# Patient Record
Sex: Female | Born: 1935 | Race: Black or African American | Hispanic: No | Smoking: Former smoker
Health system: Southern US, Community
[De-identification: ages and names within clinical notes are randomized; demographics above are authoritative.]

## PROBLEM LIST (undated history)

## (undated) DIAGNOSIS — M1990 Unspecified osteoarthritis, unspecified site: Secondary | ICD-10-CM

## (undated) DIAGNOSIS — I10 Essential (primary) hypertension: Secondary | ICD-10-CM

## (undated) DIAGNOSIS — E785 Hyperlipidemia, unspecified: Secondary | ICD-10-CM

## (undated) DIAGNOSIS — K644 Residual hemorrhoidal skin tags: Secondary | ICD-10-CM

## (undated) DIAGNOSIS — N200 Calculus of kidney: Secondary | ICD-10-CM

## (undated) DIAGNOSIS — M8580 Other specified disorders of bone density and structure, unspecified site: Secondary | ICD-10-CM

## (undated) DIAGNOSIS — W3400XA Accidental discharge from unspecified firearms or gun, initial encounter: Secondary | ICD-10-CM

## (undated) DIAGNOSIS — J449 Chronic obstructive pulmonary disease, unspecified: Secondary | ICD-10-CM

## (undated) DIAGNOSIS — Z87891 Personal history of nicotine dependence: Secondary | ICD-10-CM

## (undated) DIAGNOSIS — B029 Zoster without complications: Secondary | ICD-10-CM

## (undated) DIAGNOSIS — G5600 Carpal tunnel syndrome, unspecified upper limb: Secondary | ICD-10-CM

## (undated) HISTORY — DX: Accidental discharge from unspecified firearms or gun, initial encounter: W34.00XA

## (undated) HISTORY — DX: Unspecified osteoarthritis, unspecified site: M19.90

## (undated) HISTORY — DX: Hyperlipidemia, unspecified: E78.5

## (undated) HISTORY — DX: Zoster without complications: B02.9

## (undated) HISTORY — DX: Calculus of kidney: N20.0

## (undated) HISTORY — DX: Chronic obstructive pulmonary disease, unspecified: J44.9

## (undated) HISTORY — DX: Personal history of nicotine dependence: Z87.891

## (undated) HISTORY — DX: Residual hemorrhoidal skin tags: K64.4

## (undated) HISTORY — DX: Essential (primary) hypertension: I10

## (undated) HISTORY — DX: Carpal tunnel syndrome, unspecified upper limb: G56.00

## (undated) HISTORY — DX: Other specified disorders of bone density and structure, unspecified site: M85.80

## (undated) HISTORY — PX: OTHER SURGICAL HISTORY: SHX169

## (undated) HISTORY — PX: ABDOMINAL HYSTERECTOMY: SHX81

---

## 1999-08-01 ENCOUNTER — Encounter: Admission: RE | Admit: 1999-08-01 | Discharge: 1999-08-01 | Payer: Self-pay | Admitting: Internal Medicine

## 1999-08-15 ENCOUNTER — Ambulatory Visit (HOSPITAL_COMMUNITY): Admission: RE | Admit: 1999-08-15 | Discharge: 1999-08-15 | Payer: Self-pay

## 1999-09-03 ENCOUNTER — Encounter: Admission: RE | Admit: 1999-09-03 | Discharge: 1999-09-03 | Payer: Self-pay | Admitting: Internal Medicine

## 1999-09-14 ENCOUNTER — Encounter: Admission: RE | Admit: 1999-09-14 | Discharge: 1999-09-14 | Payer: Self-pay | Admitting: Internal Medicine

## 1999-10-15 ENCOUNTER — Encounter: Admission: RE | Admit: 1999-10-15 | Discharge: 1999-10-15 | Payer: Self-pay | Admitting: Internal Medicine

## 2000-09-04 ENCOUNTER — Ambulatory Visit (HOSPITAL_COMMUNITY): Admission: RE | Admit: 2000-09-04 | Discharge: 2000-09-04 | Payer: Self-pay

## 2001-05-11 ENCOUNTER — Encounter: Admission: RE | Admit: 2001-05-11 | Discharge: 2001-05-11 | Payer: Self-pay | Admitting: Internal Medicine

## 2002-08-30 ENCOUNTER — Encounter: Admission: RE | Admit: 2002-08-30 | Discharge: 2002-08-30 | Payer: Self-pay | Admitting: Internal Medicine

## 2002-09-14 ENCOUNTER — Encounter: Admission: RE | Admit: 2002-09-14 | Discharge: 2002-09-14 | Payer: Self-pay | Admitting: Internal Medicine

## 2003-02-03 ENCOUNTER — Ambulatory Visit (HOSPITAL_COMMUNITY): Admission: RE | Admit: 2003-02-03 | Discharge: 2003-02-03 | Payer: Self-pay | Admitting: Internal Medicine

## 2003-02-09 ENCOUNTER — Encounter: Admission: RE | Admit: 2003-02-09 | Discharge: 2003-02-09 | Payer: Self-pay | Admitting: Internal Medicine

## 2003-02-09 ENCOUNTER — Ambulatory Visit (HOSPITAL_COMMUNITY): Admission: RE | Admit: 2003-02-09 | Discharge: 2003-02-09 | Payer: Self-pay | Admitting: Internal Medicine

## 2003-02-09 DIAGNOSIS — B029 Zoster without complications: Secondary | ICD-10-CM

## 2003-02-09 HISTORY — DX: Zoster without complications: B02.9

## 2003-03-09 ENCOUNTER — Encounter: Admission: RE | Admit: 2003-03-09 | Discharge: 2003-03-09 | Payer: Self-pay | Admitting: Internal Medicine

## 2003-04-20 ENCOUNTER — Encounter: Admission: RE | Admit: 2003-04-20 | Discharge: 2003-04-20 | Payer: Self-pay | Admitting: Internal Medicine

## 2003-05-03 ENCOUNTER — Encounter: Admission: RE | Admit: 2003-05-03 | Discharge: 2003-05-03 | Payer: Self-pay | Admitting: Internal Medicine

## 2003-11-02 ENCOUNTER — Encounter: Admission: RE | Admit: 2003-11-02 | Discharge: 2003-11-02 | Payer: Self-pay | Admitting: Internal Medicine

## 2003-11-28 ENCOUNTER — Encounter: Admission: RE | Admit: 2003-11-28 | Discharge: 2003-11-28 | Payer: Self-pay | Admitting: Internal Medicine

## 2004-02-29 ENCOUNTER — Ambulatory Visit: Payer: Self-pay | Admitting: Internal Medicine

## 2004-03-21 ENCOUNTER — Ambulatory Visit: Payer: Self-pay | Admitting: Internal Medicine

## 2005-01-08 ENCOUNTER — Encounter (INDEPENDENT_AMBULATORY_CARE_PROVIDER_SITE_OTHER): Payer: Self-pay | Admitting: *Deleted

## 2005-01-08 ENCOUNTER — Ambulatory Visit: Payer: Self-pay | Admitting: Internal Medicine

## 2005-01-08 LAB — CONVERTED CEMR LAB
Cholesterol: 175 mg/dL
HDL: 53 mg/dL
LDL Cholesterol: 101 mg/dL
Triglyceride fasting, serum: 104 mg/dL

## 2005-01-25 ENCOUNTER — Ambulatory Visit: Payer: Self-pay | Admitting: Internal Medicine

## 2005-03-29 ENCOUNTER — Ambulatory Visit (HOSPITAL_COMMUNITY): Admission: RE | Admit: 2005-03-29 | Discharge: 2005-03-29 | Payer: Self-pay | Admitting: *Deleted

## 2005-04-03 ENCOUNTER — Ambulatory Visit: Payer: Self-pay | Admitting: Internal Medicine

## 2005-04-17 ENCOUNTER — Encounter: Payer: Self-pay | Admitting: Emergency Medicine

## 2005-04-18 ENCOUNTER — Inpatient Hospital Stay (HOSPITAL_COMMUNITY): Admission: AD | Admit: 2005-04-18 | Discharge: 2005-04-18 | Payer: Self-pay | Admitting: Infectious Diseases

## 2005-05-17 ENCOUNTER — Ambulatory Visit: Payer: Self-pay | Admitting: Internal Medicine

## 2005-12-04 ENCOUNTER — Ambulatory Visit: Payer: Self-pay | Admitting: Internal Medicine

## 2005-12-04 ENCOUNTER — Inpatient Hospital Stay (HOSPITAL_COMMUNITY): Admission: EM | Admit: 2005-12-04 | Discharge: 2005-12-05 | Payer: Self-pay | Admitting: Emergency Medicine

## 2005-12-20 ENCOUNTER — Ambulatory Visit: Payer: Self-pay | Admitting: Internal Medicine

## 2006-01-13 ENCOUNTER — Ambulatory Visit: Payer: Self-pay | Admitting: Internal Medicine

## 2006-03-04 ENCOUNTER — Encounter (INDEPENDENT_AMBULATORY_CARE_PROVIDER_SITE_OTHER): Payer: Self-pay | Admitting: *Deleted

## 2006-03-04 DIAGNOSIS — G5600 Carpal tunnel syndrome, unspecified upper limb: Secondary | ICD-10-CM

## 2006-03-04 DIAGNOSIS — Z87898 Personal history of other specified conditions: Secondary | ICD-10-CM

## 2006-03-04 HISTORY — DX: Carpal tunnel syndrome, unspecified upper limb: G56.00

## 2006-06-30 DIAGNOSIS — J449 Chronic obstructive pulmonary disease, unspecified: Secondary | ICD-10-CM | POA: Insufficient documentation

## 2006-06-30 DIAGNOSIS — M1990 Unspecified osteoarthritis, unspecified site: Secondary | ICD-10-CM | POA: Insufficient documentation

## 2006-06-30 DIAGNOSIS — M899 Disorder of bone, unspecified: Secondary | ICD-10-CM | POA: Insufficient documentation

## 2006-06-30 DIAGNOSIS — I10 Essential (primary) hypertension: Secondary | ICD-10-CM

## 2006-06-30 DIAGNOSIS — Z87442 Personal history of urinary calculi: Secondary | ICD-10-CM

## 2006-06-30 DIAGNOSIS — E785 Hyperlipidemia, unspecified: Secondary | ICD-10-CM | POA: Insufficient documentation

## 2006-06-30 DIAGNOSIS — M949 Disorder of cartilage, unspecified: Secondary | ICD-10-CM

## 2007-01-06 ENCOUNTER — Encounter (INDEPENDENT_AMBULATORY_CARE_PROVIDER_SITE_OTHER): Payer: Self-pay | Admitting: *Deleted

## 2007-01-06 ENCOUNTER — Ambulatory Visit: Payer: Self-pay | Admitting: Hospitalist

## 2007-01-06 DIAGNOSIS — N760 Acute vaginitis: Secondary | ICD-10-CM | POA: Insufficient documentation

## 2007-01-08 LAB — CONVERTED CEMR LAB
Candida species: NEGATIVE
Gardnerella vaginalis: POSITIVE — AB
Trichomonal Vaginitis: NEGATIVE

## 2007-08-22 ENCOUNTER — Emergency Department (HOSPITAL_COMMUNITY): Admission: EM | Admit: 2007-08-22 | Discharge: 2007-08-22 | Payer: Self-pay | Admitting: Emergency Medicine

## 2008-02-10 ENCOUNTER — Telehealth: Payer: Self-pay | Admitting: Internal Medicine

## 2008-03-25 ENCOUNTER — Encounter (INDEPENDENT_AMBULATORY_CARE_PROVIDER_SITE_OTHER): Payer: Self-pay | Admitting: *Deleted

## 2008-03-25 ENCOUNTER — Ambulatory Visit: Payer: Self-pay | Admitting: Internal Medicine

## 2008-03-25 LAB — CONVERTED CEMR LAB
ALT: 9 units/L (ref 0–35)
AST: 10 units/L (ref 0–37)
Albumin: 4.1 g/dL (ref 3.5–5.2)
Alkaline Phosphatase: 58 units/L (ref 39–117)
BUN: 13 mg/dL (ref 6–23)
Basophils Absolute: 0 10*3/uL (ref 0.0–0.1)
Basophils Relative: 0 % (ref 0–1)
CO2: 27 meq/L (ref 19–32)
Calcium: 9.1 mg/dL (ref 8.4–10.5)
Chloride: 104 meq/L (ref 96–112)
Cholesterol: 184 mg/dL (ref 0–200)
Creatinine, Ser: 0.83 mg/dL (ref 0.40–1.20)
Eosinophils Absolute: 0.1 10*3/uL (ref 0.0–0.7)
Eosinophils Relative: 2 % (ref 0–5)
Glucose, Bld: 97 mg/dL (ref 70–99)
HCT: 49 % — ABNORMAL HIGH (ref 36.0–46.0)
HDL: 49 mg/dL (ref >39)
Hemoglobin: 15.8 g/dL — ABNORMAL HIGH (ref 12.0–15.0)
Hgb A1c MFr Bld: 5.9 %
LDL Cholesterol: 107 mg/dL — ABNORMAL HIGH (ref 0–99)
Lymphocytes Relative: 49 % — ABNORMAL HIGH (ref 12–46)
Lymphs Abs: 3.5 10*3/uL (ref 0.7–4.0)
MCHC: 32.2 g/dL (ref 30.0–36.0)
MCV: 98.4 fL (ref 78.0–100.0)
Monocytes Absolute: 0.5 10*3/uL (ref 0.1–1.0)
Monocytes Relative: 7 % (ref 3–12)
Neutro Abs: 2.9 10*3/uL (ref 1.7–7.7)
Neutrophils Relative %: 41 % — ABNORMAL LOW (ref 43–77)
Platelets: 306 10*3/uL (ref 150–400)
Potassium: 3.9 meq/L (ref 3.5–5.3)
RBC: 4.98 M/uL (ref 3.87–5.11)
RDW: 14.1 % (ref 11.5–15.5)
Sodium: 143 meq/L (ref 135–145)
TSH: 2.232 microintl units/mL (ref 0.350–4.50)
Total Bilirubin: 0.7 mg/dL (ref 0.3–1.2)
Total CHOL/HDL Ratio: 3.8
Total Protein: 6.9 g/dL (ref 6.0–8.3)
Triglycerides: 142 mg/dL (ref <150)
VLDL: 28 mg/dL (ref 0–40)
WBC: 7.2 10*3/uL (ref 4.0–10.5)

## 2008-04-11 ENCOUNTER — Ambulatory Visit: Payer: Self-pay | Admitting: Infectious Diseases

## 2008-04-11 ENCOUNTER — Telehealth (INDEPENDENT_AMBULATORY_CARE_PROVIDER_SITE_OTHER): Payer: Self-pay | Admitting: *Deleted

## 2008-04-11 LAB — CONVERTED CEMR LAB
OCCULT 1: NEGATIVE
OCCULT 2: NEGATIVE
OCCULT 3: NEGATIVE

## 2008-04-11 LAB — FECAL OCCULT BLOOD, GUAIAC: Fecal Occult Blood: NEGATIVE

## 2008-08-05 ENCOUNTER — Encounter (INDEPENDENT_AMBULATORY_CARE_PROVIDER_SITE_OTHER): Payer: Self-pay | Admitting: *Deleted

## 2008-08-05 ENCOUNTER — Ambulatory Visit: Payer: Self-pay | Admitting: Internal Medicine

## 2008-08-12 ENCOUNTER — Telehealth (INDEPENDENT_AMBULATORY_CARE_PROVIDER_SITE_OTHER): Payer: Self-pay | Admitting: Pharmacy Technician

## 2008-10-17 ENCOUNTER — Telehealth (INDEPENDENT_AMBULATORY_CARE_PROVIDER_SITE_OTHER): Payer: Self-pay | Admitting: *Deleted

## 2009-03-08 ENCOUNTER — Ambulatory Visit: Payer: Self-pay | Admitting: Internal Medicine

## 2009-03-09 LAB — CONVERTED CEMR LAB
ALT: 8 units/L (ref 0–35)
AST: 11 units/L (ref 0–37)
Albumin: 4.2 g/dL (ref 3.5–5.2)
Alkaline Phosphatase: 81 units/L (ref 39–117)
BUN: 10 mg/dL (ref 6–23)
Basophils Absolute: 0 10*3/uL (ref 0.0–0.1)
Basophils Relative: 1 % (ref 0–1)
CO2: 28 meq/L (ref 19–32)
Calcium: 9.2 mg/dL (ref 8.4–10.5)
Chloride: 104 meq/L (ref 96–112)
Cholesterol: 189 mg/dL (ref 0–200)
Creatinine, Ser: 0.79 mg/dL (ref 0.40–1.20)
Creatinine, Urine: 222.4 mg/dL
Eosinophils Absolute: 0.1 10*3/uL (ref 0.0–0.7)
Eosinophils Relative: 2 % (ref 0–5)
Glucose, Bld: 112 mg/dL — ABNORMAL HIGH (ref 70–99)
HCT: 49.3 % — ABNORMAL HIGH (ref 36.0–46.0)
HDL: 54 mg/dL (ref >39)
Hemoglobin: 15.6 g/dL — ABNORMAL HIGH (ref 12.0–15.0)
LDL Cholesterol: 108 mg/dL — ABNORMAL HIGH (ref 0–99)
Lymphocytes Relative: 54 % — ABNORMAL HIGH (ref 12–46)
Lymphs Abs: 3.4 10*3/uL (ref 0.7–4.0)
MCHC: 31.6 g/dL (ref 30.0–36.0)
MCV: 99 fL (ref >=78.0)
Microalb Creat Ratio: 9.7 mg/g (ref 0.0–30.0)
Microalb, Ur: 2.15 mg/dL — ABNORMAL HIGH (ref 0.00–1.89)
Monocytes Absolute: 0.4 10*3/uL (ref 0.1–1.0)
Monocytes Relative: 7 % (ref 3–12)
Neutro Abs: 2.3 10*3/uL (ref 1.7–7.7)
Neutrophils Relative %: 36 % — ABNORMAL LOW (ref 43–77)
Platelets: 269 10*3/uL (ref 150–400)
Potassium: 3.8 meq/L (ref 3.5–5.3)
RBC: 4.98 M/uL (ref 3.87–5.11)
RDW: 13 % (ref 11.5–15.5)
Sodium: 142 meq/L (ref 135–145)
Total Bilirubin: 0.6 mg/dL (ref 0.3–1.2)
Total CHOL/HDL Ratio: 3.5
Total Protein: 7 g/dL (ref 6.0–8.3)
Triglycerides: 136 mg/dL (ref <150)
VLDL: 27 mg/dL (ref 0–40)
WBC: 6.3 10*3/uL (ref 4.0–10.5)

## 2009-03-27 ENCOUNTER — Ambulatory Visit: Payer: Self-pay | Admitting: Internal Medicine

## 2009-03-27 LAB — CONVERTED CEMR LAB
OCCULT 1: NEGATIVE
OCCULT 2: NEGATIVE
OCCULT 3: NEGATIVE

## 2009-06-03 ENCOUNTER — Emergency Department (HOSPITAL_COMMUNITY): Admission: EM | Admit: 2009-06-03 | Discharge: 2009-06-03 | Payer: Self-pay | Admitting: Emergency Medicine

## 2010-02-27 ENCOUNTER — Ambulatory Visit: Payer: Self-pay | Admitting: Internal Medicine

## 2010-02-27 LAB — CONVERTED CEMR LAB
BUN: 13 mg/dL (ref 6–23)
CO2: 29 meq/L (ref 19–32)
Calcium: 9.3 mg/dL (ref 8.4–10.5)
Chloride: 104 meq/L (ref 96–112)
Cholesterol: 173 mg/dL (ref 0–200)
Creatinine, Ser: 0.75 mg/dL (ref 0.40–1.20)
Glucose, Bld: 103 mg/dL — ABNORMAL HIGH (ref 70–99)
HDL: 54 mg/dL (ref >39)
LDL Cholesterol: 97 mg/dL (ref 0–99)
Potassium: 4.3 meq/L (ref 3.5–5.3)
Sodium: 143 meq/L (ref 135–145)
Total CHOL/HDL Ratio: 3.2
Triglycerides: 112 mg/dL (ref <150)
VLDL: 22 mg/dL (ref 0–40)

## 2010-02-27 LAB — HM MAMMOGRAPHY
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
## 2010-07-13 ENCOUNTER — Ambulatory Visit: Admit: 2010-07-13 | Payer: Self-pay

## 2010-07-13 ENCOUNTER — Encounter: Payer: Self-pay | Admitting: Internal Medicine

## 2010-07-13 ENCOUNTER — Ambulatory Visit (INDEPENDENT_AMBULATORY_CARE_PROVIDER_SITE_OTHER): Payer: MEDICARE | Admitting: Internal Medicine

## 2010-07-13 VITALS — BP 130/70 | HR 83 | Temp 97.1°F | Ht 65.0 in | Wt 187.4 lb

## 2010-07-13 DIAGNOSIS — J4489 Other specified chronic obstructive pulmonary disease: Secondary | ICD-10-CM

## 2010-07-13 DIAGNOSIS — J449 Chronic obstructive pulmonary disease, unspecified: Secondary | ICD-10-CM

## 2010-07-13 MED ORDER — ALBUTEROL SULFATE HFA 108 (90 BASE) MCG/ACT IN AERS: 2.0000 | INHALATION_SPRAY | RESPIRATORY_TRACT | Status: DC | PRN

## 2010-07-13 MED ORDER — ALBUTEROL SULFATE (2.5 MG/3ML) 0.083% IN NEBU
2.5000 mg | INHALATION_SOLUTION | Freq: Once | RESPIRATORY_TRACT | Status: AC
  Administered 2010-07-13: 5 mg via RESPIRATORY_TRACT

## 2010-07-13 MED ORDER — FLUTICASONE PROPIONATE HFA 110 MCG/ACT IN AERO: 1.0000 | INHALATION_SPRAY | Freq: Two times a day (BID) | RESPIRATORY_TRACT | Status: DC

## 2010-07-13 MED ORDER — ALBUTEROL SULFATE 0.63 MG/3ML IN NEBU: 0.6300 mg | INHALATION_SOLUTION | Freq: Once | RESPIRATORY_TRACT | Status: DC

## 2010-07-13 MED ORDER — AMLODIPINE BESYLATE 5 MG PO TABS: 5.0000 mg | ORAL_TABLET | Freq: Every day | ORAL | Status: DC

## 2010-07-13 MED ORDER — PRAVASTATIN SODIUM 40 MG PO TABS: 80.0000 mg | ORAL_TABLET | Freq: Every day | ORAL | Status: DC

## 2010-07-13 NOTE — Progress Notes (Signed)
Addended byChinita Pester on: 07/13/2010 12:55 PM   Modules accepted: Orders

## 2010-07-13 NOTE — Assessment & Plan Note (Signed)
Is nearing need for continuous O2, but is also currently out of her inhalers. Nothing to suggest her current symptoms are acute, and she feels at baseline. As a precaution will check a CBC and BNP,  Plan to refill her MDI's (she has Medicare part D) have her RTC in one week to recheck sats, if still low begin O2 then

## 2010-07-13 NOTE — Progress Notes (Signed)
  Subjective:    Patient ID: [name], female    DOB: Oct 08, 1931, 77 y.o.   MRN: [id number]  HPI    Review of Systems  Constitutional: Positive for fatigue.  HENT: Negative for congestion, rhinorrhea, sneezing and postnasal drip.   Eyes: Negative for discharge, itching and visual disturbance.  Respiratory: Positive for cough, chest tightness, shortness of breath and wheezing. Negative for choking.   Cardiovascular: Negative for chest pain, palpitations and leg swelling.  Gastrointestinal: Negative for abdominal distention.  Genitourinary: Positive for frequency (Has nocturia x 3-4). Negative for hematuria.  Neurological: Negative for dizziness, light-headedness and headaches.  Psychiatric/Behavioral: Negative for dysphoric mood and agitation.       Objective:   Physical Exam  Constitutional: She is oriented to person, place, and time.       Had on initial presentation cyanotic appearance with SaO2 of 85%, improving quickly to 97% after nebulized albuterol  HENT:  Head: Normocephalic and atraumatic.  Eyes: Conjunctivae and EOM are normal. Pupils are equal, round, and reactive to light.  Neck: No thyromegaly present.  Cardiovascular: Normal rate, regular rhythm, normal heart sounds and intact distal pulses.  Exam reveals no gallop and no friction rub.   No murmur heard. Pulmonary/Chest: She is in respiratory distress (modearate increased resp effort). She has wheezes. She has rales (a few basilar crackles).  Musculoskeletal: Normal range of motion. She exhibits no edema and no tenderness.  Neurological: She is alert and oriented to person, place, and time. She has normal reflexes.  Skin: Skin is warm. She is not diaphoretic.  Psychiatric: Her behavior is normal. Judgment normal.          Assessment & Plan:

## 2010-08-20 ENCOUNTER — Encounter: Payer: Self-pay | Admitting: Ophthalmology

## 2010-09-10 LAB — CBC
HCT: 46.2 % — ABNORMAL HIGH (ref 36.0–46.0)
Hemoglobin: 14.9 g/dL (ref 12.0–15.0)
MCHC: 32.2 g/dL (ref 30.0–36.0)
MCV: 98.1 fL (ref 78.0–100.0)
Platelets: 221 10*3/uL (ref 150–400)
RBC: 4.71 MIL/uL (ref 3.87–5.11)
RDW: 13.6 % (ref 11.5–15.5)
WBC: 8.7 10*3/uL (ref 4.0–10.5)

## 2010-09-10 LAB — DIFFERENTIAL
Basophils Absolute: 0.1 10*3/uL (ref 0.0–0.1)
Basophils Relative: 1 % (ref 0–1)
Eosinophils Absolute: 0.2 10*3/uL (ref 0.0–0.7)
Eosinophils Relative: 2 % (ref 0–5)
Lymphocytes Relative: 52 % — ABNORMAL HIGH (ref 12–46)
Lymphs Abs: 4.6 10*3/uL — ABNORMAL HIGH (ref 0.7–4.0)
Monocytes Absolute: 0.6 10*3/uL (ref 0.1–1.0)
Monocytes Relative: 7 % (ref 3–12)
Neutro Abs: 3.4 10*3/uL (ref 1.7–7.7)
Neutrophils Relative %: 39 % — ABNORMAL LOW (ref 43–77)

## 2010-09-10 LAB — POCT I-STAT, CHEM 8
BUN: 13 mg/dL (ref 6–23)
Calcium, Ion: 1.1 mmol/L — ABNORMAL LOW (ref 1.12–1.32)
Chloride: 106 mEq/L (ref 96–112)
Creatinine, Ser: 0.7 mg/dL (ref 0.4–1.2)
Glucose, Bld: 150 mg/dL — ABNORMAL HIGH (ref 70–99)
HCT: 49 % — ABNORMAL HIGH (ref 36.0–46.0)
Hemoglobin: 16.7 g/dL — ABNORMAL HIGH (ref 12.0–15.0)
Potassium: 3.4 mEq/L — ABNORMAL LOW (ref 3.5–5.1)
Sodium: 144 mEq/L (ref 135–145)
TCO2: 32 mmol/L (ref 0–100)
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
## 2010-12-16 ENCOUNTER — Encounter: Payer: Self-pay | Admitting: Internal Medicine

## 2011-03-01 ENCOUNTER — Emergency Department (HOSPITAL_COMMUNITY): Payer: Medicare Other

## 2011-03-01 ENCOUNTER — Emergency Department (HOSPITAL_COMMUNITY)
Admission: EM | Admit: 2011-03-01 | Discharge: 2011-03-01 | Disposition: A | Discharge status: Other Acute Inpatient Hospital | Payer: Medicare Other | Source: Home / Self Care | Attending: Emergency Medicine | Admitting: Emergency Medicine

## 2011-03-01 DIAGNOSIS — R42 Dizziness and giddiness: Secondary | ICD-10-CM

## 2011-03-01 DIAGNOSIS — R0602 Shortness of breath: Secondary | ICD-10-CM

## 2011-03-01 LAB — POCT I-STAT, CHEM 8
BUN: 15 mg/dL (ref 6–23)
Calcium, Ion: 1.14 mmol/L (ref 1.12–1.32)
Chloride: 103 mEq/L (ref 96–112)
Creatinine, Ser: 0.9 mg/dL (ref 0.50–1.10)
Glucose, Bld: 104 mg/dL — ABNORMAL HIGH (ref 70–99)
HCT: 59 % — ABNORMAL HIGH (ref 36.0–46.0)
Hemoglobin: 20.1 g/dL — ABNORMAL HIGH (ref 12.0–15.0)
Potassium: 3.7 mEq/L (ref 3.5–5.1)
Sodium: 142 mEq/L (ref 135–145)
TCO2: 28 mmol/L (ref 0–100)

## 2011-03-01 LAB — BLOOD GAS, ARTERIAL
Bicarbonate: 31.2 mEq/L — ABNORMAL HIGH (ref 20.0–24.0)
O2 Saturation: 96.7 %
Patient temperature: 37
TCO2: 27.3 mmol/L (ref 0–100)
pH, Arterial: 7.21 — ABNORMAL LOW (ref 7.350–7.400)

## 2011-03-01 LAB — CBC
HCT: 53.1 % — ABNORMAL HIGH (ref 36.0–46.0)
Hemoglobin: 17.4 g/dL — ABNORMAL HIGH (ref 12.0–15.0)
MCH: 31.9 pg (ref 26.0–34.0)
MCHC: 32.8 g/dL (ref 30.0–36.0)
MCV: 97.4 fL (ref 78.0–100.0)
Platelets: 230 10*3/uL (ref 150–400)
RBC: 5.45 MIL/uL — ABNORMAL HIGH (ref 3.87–5.11)
RDW: 13.7 % (ref 11.5–15.5)
WBC: 6.5 10*3/uL (ref 4.0–10.5)

## 2011-03-01 LAB — URINALYSIS, ROUTINE W REFLEX MICROSCOPIC
Glucose, UA: NEGATIVE mg/dL
Leukocytes, UA: NEGATIVE
Nitrite: NEGATIVE
Protein, ur: 30 mg/dL — AB
pH: 6 (ref 5.0–8.0)

## 2011-03-01 LAB — POCT I-STAT TROPONIN I: Troponin i, poc: 0.02 ng/mL (ref 0.00–0.08)

## 2011-03-01 LAB — URINE MICROSCOPIC-ADD ON

## 2011-03-01 LAB — GLUCOSE, CAPILLARY: Glucose-Capillary: 118 mg/dL — ABNORMAL HIGH (ref 70–99)

## 2011-03-01 LAB — PRO B NATRIURETIC PEPTIDE: Pro B Natriuretic peptide (BNP): 613.2 pg/mL — ABNORMAL HIGH (ref 0–450)

## 2011-03-02 ENCOUNTER — Inpatient Hospital Stay (HOSPITAL_COMMUNITY): Payer: Medicare Other

## 2011-03-02 ENCOUNTER — Inpatient Hospital Stay (HOSPITAL_COMMUNITY)
Admission: EM | Admit: 2011-03-02 | Discharge: 2011-03-06 | DRG: 291 | Disposition: A | Discharge status: Home Health | Payer: Medicare Other | Source: Other Acute Inpatient Hospital | Attending: Internal Medicine | Admitting: Internal Medicine

## 2011-03-02 DIAGNOSIS — I4891 Unspecified atrial fibrillation: Secondary | ICD-10-CM

## 2011-03-02 DIAGNOSIS — Z23 Encounter for immunization: Secondary | ICD-10-CM

## 2011-03-02 DIAGNOSIS — M1990 Unspecified osteoarthritis, unspecified site: Secondary | ICD-10-CM | POA: Diagnosis present

## 2011-03-02 DIAGNOSIS — I5043 Acute on chronic combined systolic (congestive) and diastolic (congestive) heart failure: Principal | ICD-10-CM | POA: Diagnosis present

## 2011-03-02 DIAGNOSIS — Z79899 Other long term (current) drug therapy: Secondary | ICD-10-CM

## 2011-03-02 DIAGNOSIS — I10 Essential (primary) hypertension: Secondary | ICD-10-CM | POA: Diagnosis present

## 2011-03-02 DIAGNOSIS — I509 Heart failure, unspecified: Secondary | ICD-10-CM | POA: Diagnosis present

## 2011-03-02 DIAGNOSIS — I471 Supraventricular tachycardia: Secondary | ICD-10-CM

## 2011-03-02 DIAGNOSIS — I279 Pulmonary heart disease, unspecified: Secondary | ICD-10-CM | POA: Diagnosis present

## 2011-03-02 DIAGNOSIS — F17200 Nicotine dependence, unspecified, uncomplicated: Secondary | ICD-10-CM | POA: Diagnosis present

## 2011-03-02 DIAGNOSIS — J441 Chronic obstructive pulmonary disease with (acute) exacerbation: Secondary | ICD-10-CM | POA: Diagnosis present

## 2011-03-02 DIAGNOSIS — I498 Other specified cardiac arrhythmias: Secondary | ICD-10-CM | POA: Diagnosis present

## 2011-03-02 DIAGNOSIS — J9600 Acute respiratory failure, unspecified whether with hypoxia or hypercapnia: Secondary | ICD-10-CM

## 2011-03-02 DIAGNOSIS — E785 Hyperlipidemia, unspecified: Secondary | ICD-10-CM | POA: Diagnosis present

## 2011-03-02 DIAGNOSIS — J449 Chronic obstructive pulmonary disease, unspecified: Secondary | ICD-10-CM

## 2011-03-02 DIAGNOSIS — J9620 Acute and chronic respiratory failure, unspecified whether with hypoxia or hypercapnia: Secondary | ICD-10-CM | POA: Diagnosis present

## 2011-03-02 LAB — BLOOD GAS, ARTERIAL
Acid-Base Excess: 5.6 mmol/L — ABNORMAL HIGH (ref 0.0–2.0)
Delivery systems: POSITIVE
Drawn by: 34779
Expiratory PAP: 5
FIO2: 0.4 %
Inspiratory PAP: 10
O2 Saturation: 91.3 %
Patient temperature: 98.6
RATE: 12 resp/min
pO2, Arterial: 63.6 mmHg — ABNORMAL LOW (ref 80.0–100.0)

## 2011-03-02 LAB — BASIC METABOLIC PANEL
BUN: 14 mg/dL (ref 6–23)
CO2: 33 mEq/L — ABNORMAL HIGH (ref 19–32)
Chloride: 104 mEq/L (ref 96–112)
GFR calc non Af Amer: >60 mL/min (ref >60)
Glucose, Bld: 144 mg/dL — ABNORMAL HIGH (ref 70–99)
Potassium: 4.1 mEq/L (ref 3.5–5.1)
Sodium: 144 mEq/L (ref 135–145)

## 2011-03-02 LAB — LIPID PANEL
HDL: 65 mg/dL (ref >39)
LDL Cholesterol: 99 mg/dL (ref 0–99)
Total CHOL/HDL Ratio: 2.7 RATIO
Triglycerides: 47 mg/dL (ref <150)

## 2011-03-02 LAB — MRSA PCR SCREENING: MRSA by PCR: NEGATIVE

## 2011-03-02 LAB — CK TOTAL AND CKMB (NOT AT ARMC): CK, MB: 5.3 ng/mL — ABNORMAL HIGH (ref 0.3–4.0)

## 2011-03-03 ENCOUNTER — Inpatient Hospital Stay (HOSPITAL_COMMUNITY): Payer: Medicare Other

## 2011-03-03 DIAGNOSIS — J449 Chronic obstructive pulmonary disease, unspecified: Secondary | ICD-10-CM

## 2011-03-03 DIAGNOSIS — I471 Supraventricular tachycardia: Secondary | ICD-10-CM

## 2011-03-03 DIAGNOSIS — J9600 Acute respiratory failure, unspecified whether with hypoxia or hypercapnia: Secondary | ICD-10-CM

## 2011-03-03 LAB — BASIC METABOLIC PANEL
BUN: 26 mg/dL — ABNORMAL HIGH (ref 6–23)
CO2: 37 mEq/L — ABNORMAL HIGH (ref 19–32)
Calcium: 8.8 mg/dL (ref 8.4–10.5)
Chloride: 102 mEq/L (ref 96–112)
Creatinine, Ser: 0.85 mg/dL (ref 0.50–1.10)

## 2011-03-03 LAB — CBC
HCT: 49.4 % — ABNORMAL HIGH (ref 36.0–46.0)
MCH: 32.6 pg (ref 26.0–34.0)
MCV: 97.6 fL (ref 78.0–100.0)
RBC: 5.06 MIL/uL (ref 3.87–5.11)
WBC: 7.6 10*3/uL (ref 4.0–10.5)

## 2011-03-03 LAB — BLOOD GAS, ARTERIAL
Acid-Base Excess: 7.4 mmol/L — ABNORMAL HIGH (ref 0.0–2.0)
O2 Content: 4 L/min
O2 Saturation: 92.6 %
TCO2: 34.8 mmol/L (ref 0–100)
pCO2 arterial: 61.2 mmHg (ref 35.0–45.0)
pO2, Arterial: 67 mmHg — ABNORMAL LOW (ref 80.0–100.0)

## 2011-03-04 ENCOUNTER — Inpatient Hospital Stay (HOSPITAL_COMMUNITY): Payer: Medicare Other

## 2011-03-04 DIAGNOSIS — I471 Supraventricular tachycardia: Secondary | ICD-10-CM

## 2011-03-04 LAB — BASIC METABOLIC PANEL
BUN: 42 mg/dL — ABNORMAL HIGH (ref 6–23)
BUN: 4 — ABNORMAL LOW
CO2: 33 mEq/L — ABNORMAL HIGH (ref 19–32)
Calcium: 8.4 mg/dL (ref 8.4–10.5)
Calcium: 9.2
Chloride: 101 mEq/L (ref 96–112)
Creatinine, Ser: 0.89 mg/dL (ref 0.50–1.10)
Creatinine, Ser: 0.89
GFR calc Af Amer: >60 mL/min (ref >60)
GFR calc non Af Amer: >60 mL/min (ref >60)
GFR calc non Af Amer: >60
Glucose, Bld: 118 mg/dL — ABNORMAL HIGH (ref 70–99)
Glucose, Bld: 103 — ABNORMAL HIGH
Potassium: 3.5 mEq/L (ref 3.5–5.1)
Potassium: 3.6
Sodium: 142 mEq/L (ref 135–145)

## 2011-03-04 LAB — DIFFERENTIAL
Eosinophils Absolute: 0.2
Eosinophils Relative: 2
Lymphs Abs: 3.5
Monocytes Absolute: 0.4
Monocytes Relative: 5

## 2011-03-04 LAB — CBC
HCT: 50.2 % — ABNORMAL HIGH (ref 36.0–46.0)
HCT: 46.3 — ABNORMAL HIGH
Hemoglobin: 16.1 g/dL — ABNORMAL HIGH (ref 12.0–15.0)
MCH: 31.6 pg (ref 26.0–34.0)
MCHC: 32.1 g/dL (ref 30.0–36.0)
MCV: 98.6 fL (ref 78.0–100.0)
Platelets: 238 10*3/uL (ref 150–400)
Platelets: 325
RBC: 5.09 MIL/uL (ref 3.87–5.11)
RDW: 13.6 % (ref 11.5–15.5)
RDW: 13.7
WBC: 10.9 10*3/uL — ABNORMAL HIGH (ref 4.0–10.5)

## 2011-03-04 LAB — POCT CARDIAC MARKERS: Myoglobin, poc: 43.4

## 2011-03-05 DIAGNOSIS — J9600 Acute respiratory failure, unspecified whether with hypoxia or hypercapnia: Secondary | ICD-10-CM

## 2011-03-05 DIAGNOSIS — I471 Supraventricular tachycardia: Secondary | ICD-10-CM

## 2011-03-05 DIAGNOSIS — J449 Chronic obstructive pulmonary disease, unspecified: Secondary | ICD-10-CM

## 2011-03-05 LAB — BASIC METABOLIC PANEL
CO2: 36 mEq/L — ABNORMAL HIGH (ref 19–32)
Chloride: 100 mEq/L (ref 96–112)
GFR calc non Af Amer: >60 mL/min (ref >60)
Glucose, Bld: 80 mg/dL (ref 70–99)
Potassium: 4 mEq/L (ref 3.5–5.1)
Sodium: 143 mEq/L (ref 135–145)

## 2011-03-06 DIAGNOSIS — J449 Chronic obstructive pulmonary disease, unspecified: Secondary | ICD-10-CM

## 2011-03-19 NOTE — Discharge Summary (Signed)
[name], [name] NO.:  [id number]  MEDICAL RECORD NO.:  [id number]  LOCATION:  2033                         FACILITY:  [facility]  PHYSICIAN:  Shannon Pick. Eden Emms, MD, FACCDATE OF BIRTH:  07-31-1931  DATE OF ADMISSION:  03/02/2011 DATE OF DISCHARGE:  03/06/2011                              DISCHARGE SUMMARY   PRIMARY CARDIOLOGIST:  Shannon Riffle, MD, [facility]  PULMONOLOGIST:  Shannon Cradle. Delford Field, MD, [facility]  PRIMARY CARE:  Provided at the [facility].  DISCHARGE DIAGNOSES: 1. Acute on chronic severe obstructive lung disease. 2. Acute hypercarbic respiratory failure. 3. Supraventricular tachycardia in the setting of respiratory failure. 4. Ongoing tobacco abuse, now on home O2. 5. Hyperlipidemia. 6. Hypertension. 7. Osteoarthritis. 8. Acute diastolic congestive heart failure with normal left     ventricular function by echocardiogram.  SECONDARY DIAGNOSIS:  Pulmonary hypertension.  ALLERGIES:  No known drug allergies.  HISTORY OF PRESENT ILLNESS:  This is a 77 year old female with history of severe COPD who continues to smoke at home, who was in her usual state of health until the day prior to admission when she began to experience progressive dyspnea with lightheadedness, dizziness, and neck discomfort.  She presented to [facility] ED on March 01, 2011, and her chest x-ray showed COPD with superimposed new pulmonary vascular prominence suggestive of mild edema.  She was tachycardic and having intermittent runs of SVT which were managed with diltiazem infusion. Her BNP was mildly elevated at 613 and Cardiology was called for evaluation and admission.  HOSPITAL COURSE:  Following admission, the patient was placed on nebulizer therapy as well as prednisone for her severe lung disease. She was also placed on intravenous Lasix and this was subsequently discontinued.  Pulmonary edema resolved on chest x-ray by March 03, 2011.  Two-D  echocardiogram was performed on March 03, 2011, showing normal LV function without wall motion abnormalities or valvular abnormalities.  She was noted to have an elevated pulmonary artery systolic pressure of 71 mmHg.  As the patient's respiratory issues were felt to be more pulmonary in nature, Pulmonology was consulted on March 02, 2011, and they recommended initiation of Xopenex nebulizer p.r.n. in addition to budesonide and Brovana nebulizers.  The patient had already been placed on prednisone taper on the day of admission with multiple modalities and respiratory status improved.  Case Management has been working with the patient and has arranged for advanced home care for home O2 (2 liters at rest and 3 liters with exertion) as well as outpatient nebulizer therapy.  She is felt to be stable for discharge today in good condition.  For the patient's SVT, this was initially controlled with IV diltiazem which was subsequently converted to oral short-acting diltiazem.  As the patient's respiratory status improved, the patient has had no further SVT.  DISCHARGE LABS:  Hemoglobin 16.1, hematocrit 50.2, WBC 10.9, and platelets 238.  D-dimer 0.45.  Sodium 143, potassium 4.0, chloride 100, CO2 of 36, BUN 26, creatinine 0.27, glucose 80, and calcium 9.0.  CK 72, MB 5.3, and troponin I less than 0.30.  BNP 227.  Total cholesterol 173, triglycerides 47, HDL 65, and LDL 99.  MRSA  screen was negative.  DISPOSITION:  The patient will be discharged home today in good condition.  FOLLOWUP PLANS AND APPOINTMENTS:  The patient has to follow up with [facility]/Dr. Delford Jacobs on March 22, 2011, at 11:30 a.m.  She will follow with Dr. Tenny Jacobs on April 01, 2011, at 8:30 a.m.  DISCHARGE MEDICATIONS: 1. Aspirin 81 mg daily. 2. Brovana 15 mcg/2 mL 15 mcg b.i.d. 3. Budesonide 0.25 mg/2 mL 0.25 mg inhaled b.i.d. 4. Diltiazem 30 mg b.i.d. 5. Xopenex 1.25 mg inhaled q.4 h. p.r.n. 6.  Protonix 40 mg daily. 7. Prednisone 10 mg 3 tablets daily x3 days, then 2 tablets daily x3     days, then 1 tablet daily x3 days. 8. Albuterol inhaler 2 puffs q.4 h. p.r.n. 9. Pravachol 40 mg 2 tablets daily.  OUTSTANDING LABS AND STUDIES:  None.  Duration of discharge encounter is 45 minutes including physician time.     [name], ANP   ______________________________ Shannon Pick. Eden Emms, MD, [facility]    CB/MEDQ  D:  03/06/2011  T:  03/06/2011  Job:  [id number]  Electronically Signed by [name] ANP on 03/12/2011 06:09:21 PM Electronically Signed by Charlton Haws MD [facility] on 03/19/2011 07:25:36 PM

## 2011-03-21 NOTE — H&P (Signed)
NAME:  CORRISSA, MARTELLO NO.:  [id number]  MEDICAL RECORD NO.:  [id number]  LOCATION:  [facility]                         FACILITY:  [facility]  PHYSICIAN:  Pricilla Riffle, MD, FACCDATE OF BIRTH:  [birth year]  DATE OF ADMISSION:  03/01/2011 DATE OF DISCHARGE:  03/01/2011                             HISTORY & PHYSICAL   IDENTIFICATION:  Ms. Bina is a 77 year old who presents to the emergency room with shortness of breath and dizziness.  HISTORY OF PRESENT ILLNESS:  The patient said she started feeling badly yesterday.  She was lightheaded, weak, short of breath, and dizzy.  No syncope.  Episodes would come and go.  She presented to the emergency room for evaluation and was found to be an SVT at times, break started again.  IV Cardizem was started.  The patient denied palpitations.  Note, she has been out of breath for a long time with exertion.  She sleeps okay except has to go to the bathroom 4-5 times per night. Denies chest pain.  ALLERGIES:  None.  MEDICATIONS AT HOME:  She is on pravastatin question dose, antihypertensive question name, albuterol and Ventolin inhalers as needed.Marland Kitchen  PAST MEDICAL HISTORY: 1. COPD. 2. Dyslipidemia. 3. Hypertension. 4. Osteoarthritis.  SOCIAL HISTORY:  The patient lives in [place] with her daughter.  She has greater than a 50 pack-year history of smoking.  She is now down to a rare cigarette.  Drinks occasionally.  FAMILY HISTORY:  Mother died of a CVA.  Father died of unknown causes. One brother on oxygen with COPD and one sister on dialysis.  REVIEW OF SYSTEMS:  No fevers, chills.  No headaches.  Shortness of breath and dyspnea on exertion as noted.  The patient can barely get across the room, she says.  One pillow orthopnea is chronic.  No edema. No chest pain.  No urinary urgency or dysuria.  No nausea, vomiting, or diarrhea.  Otherwise, all systems reviewed and negative to the above problem except as noted  above.  PHYSICAL EXAMINATION:  GENERAL:  The patient is in mild distress because of shortness of breath.  She is breathing through pursed lips. VITAL SIGNS:  Blood pressure 124/84, pulse 72 and regular, temperature is 98; O2 sat on room air was 83, then 88 on non-rebreather, now 90% on 3 L, decreases with movement to 88%. HEENT:  Normocephalic, atraumatic, PERRLA.  EOMI. NECK:  No JVD.  No bruits. LUNGS:  Decreased airflow throughout.  No rales, no wheezes. CARDIAC:  Regular rate and rhythm.  S1 and S2.  No S3, S4, or murmurs. ABDOMEN:  Supple, nontender.  No hepatomegaly.  No masses.  Normal bowel sounds. EXTREMITIES:  Good distal pulses.  No lower extremity edema. NEUROLOGIC:  Alert and oriented x3.  Cranial nerves II through XII are grossly intact, moving all extremities.  Chest x-ray, rotated tortuosity of the aorta noted, mild vascular prominence, COPD.  A 12-lead EKG; at 1814 sinus rhythm with PACs and PVCs, 104 beats per minute.  At 1831, SVT at 230 beats per minute.  LABORATORY DATA:  Significant for a hemoglobin of 17.4, BUN and creatinine of 15 and 0.9, potassium of 3.7, sodium of  142.  BNP of 613. Initial troponin 0.02.  IMPRESSION: 1. The patient is a 77 year old woman with a history of significant     chronic obstructive pulmonary disease.  She presents with shortness     of breath and dizziness.  In the emergency room, she was found to     have a few episodes of supraventricular tachycardia that started     abruptly and converted on their own.  With this, she felt more     short of breath and more dizzy, but she did not sense palpitations.     On examination, significant for decreased airflow, the patient is     breathing with pursed lips, sats are marginal.  Chest x-ray     shifted, some vascular congestion with a BNP of 613.  Recommend:     a.     Continue IV diltiazem.     b.     Get echo in the morning.     c.     Steroid/NMTs, Pulmonary consult in morning.      d.     Check ABG, check D-dimer. 2. Hypertension.  Follow on diltiazem, get medicines from home. 3. Dyslipidemia.  Get medicines from home on dosing.  Check lipid     values in a.m.     Pricilla Riffle, MD, [facility]     PVR/MEDQ  D:  03/01/2011  T:  03/02/2011  Job:  [id number]  Electronically Signed by Dietrich Pates MD [facility] on 03/21/2011 10:21:44 AM

## 2011-03-22 ENCOUNTER — Encounter: Payer: Self-pay | Admitting: Critical Care Medicine

## 2011-03-22 ENCOUNTER — Ambulatory Visit (INDEPENDENT_AMBULATORY_CARE_PROVIDER_SITE_OTHER): Payer: Medicare Other | Admitting: Critical Care Medicine

## 2011-03-22 VITALS — BP 140/78 | HR 68 | Temp 97.7°F | Ht 64.0 in | Wt 176.0 lb

## 2011-03-22 DIAGNOSIS — J449 Chronic obstructive pulmonary disease, unspecified: Secondary | ICD-10-CM

## 2011-03-22 NOTE — Patient Instructions (Signed)
We will ask [facility] to reassess you for continuous flow oxygen. A referral to pulmonary rehab will be made No change in medications. Return in        3 months
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
## 2011-03-23 NOTE — Assessment & Plan Note (Addendum)
Golds III Copd primary emphysema  Recent hospitalization for copd exac now improved Best fit portable oxygen pulm rehab referral No change in inhaled or maintenance medications.

## 2011-04-01 ENCOUNTER — Encounter: Payer: Self-pay | Admitting: Internal Medicine

## 2011-04-01 ENCOUNTER — Ambulatory Visit (INDEPENDENT_AMBULATORY_CARE_PROVIDER_SITE_OTHER): Payer: Medicare Other | Admitting: Internal Medicine

## 2011-04-01 DIAGNOSIS — E785 Hyperlipidemia, unspecified: Secondary | ICD-10-CM

## 2011-04-01 DIAGNOSIS — J449 Chronic obstructive pulmonary disease, unspecified: Secondary | ICD-10-CM

## 2011-04-01 DIAGNOSIS — I498 Other specified cardiac arrhythmias: Secondary | ICD-10-CM

## 2011-04-01 DIAGNOSIS — I471 Supraventricular tachycardia: Secondary | ICD-10-CM

## 2011-04-01 MED ORDER — ALBUTEROL SULFATE HFA 108 (90 BASE) MCG/ACT IN AERS: 2.0000 | INHALATION_SPRAY | RESPIRATORY_TRACT | Status: DC | PRN

## 2011-04-01 MED ORDER — PRAVASTATIN SODIUM 40 MG PO TABS: 80.0000 mg | ORAL_TABLET | Freq: Every day | ORAL | Status: DC

## 2011-04-01 NOTE — Assessment & Plan Note (Signed)
Patient with SVT on admit.  NOw in SR on exam.  Prob related to catechol surge with COPD flare Keep on low dose dilt for now.
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
## 2011-04-01 NOTE — Progress Notes (Signed)
Addended by: Vickie Epley on: 04/01/2011 09:55 AM   Modules accepted: Orders

## 2011-04-01 NOTE — Patient Instructions (Signed)
Your physician wants you to follow-up in: April 2013 with Dr.Ross You will receive a reminder letter in the mail two months in advance. If you don't receive a letter, please call our office to schedule the follow-up appointment.

## 2011-04-01 NOTE — Assessment & Plan Note (Signed)
Good control on current regimen.  LDL was 99.  HDL was 69  Continue.

## 2011-04-05 ENCOUNTER — Telehealth: Payer: Self-pay | Admitting: Internal Medicine

## 2011-04-05 NOTE — Telephone Encounter (Signed)
New message.  Patient has a bad head/chest cold.  Would like something called in.  Might be running low temp.  She is taking Robitussin and Tylenol.  [facility] [address]/  Please call her back and advise.

## 2011-04-05 NOTE — Telephone Encounter (Signed)
Patient complaining of a "head cold" with coughing when she lays down at night. Not sure if she has a fever. Advised her to check temperature and call Dr.Wright or [facility]. Patient verbalized understanding.

## 2011-04-08 ENCOUNTER — Telehealth: Payer: Self-pay | Admitting: Critical Care Medicine

## 2011-04-08 MED ORDER — AZITHROMYCIN 250 MG PO TABS: ORAL_TABLET | ORAL | Status: AC

## 2011-04-08 NOTE — Telephone Encounter (Signed)
Call in azithromycin 250mg Take two once then one daily until gone #6 

## 2011-04-08 NOTE — Telephone Encounter (Signed)
Spoke with the pt and she is c/o productive cough with clear phlegm, chest congestion, body aches, and chills x 1 week. She dneis any increased SOB, wheezing, or chest tightness. She was last seen 10-12 and refused appt stating she was "just seen." Pt is requesting an antibiotic be called in to pharmacy. Please advise. Carron Curie, CMA No Known Allergies

## 2011-04-08 NOTE — Telephone Encounter (Signed)
rx sent. Pt aware.[name], CMA  

## 2011-07-05 ENCOUNTER — Ambulatory Visit: Payer: Medicare Other | Admitting: Critical Care Medicine

## 2011-07-16 ENCOUNTER — Ambulatory Visit: Payer: Medicare Other | Admitting: Critical Care Medicine

## 2011-07-16 ENCOUNTER — Ambulatory Visit (INDEPENDENT_AMBULATORY_CARE_PROVIDER_SITE_OTHER): Payer: Medicare Other | Admitting: Critical Care Medicine

## 2011-07-16 ENCOUNTER — Encounter: Payer: Self-pay | Admitting: Critical Care Medicine

## 2011-07-16 DIAGNOSIS — J449 Chronic obstructive pulmonary disease, unspecified: Secondary | ICD-10-CM

## 2011-07-16 MED ORDER — AZITHROMYCIN 250 MG PO TABS: 250.0000 mg | ORAL_TABLET | Freq: Every day | ORAL | Status: AC

## 2011-07-16 MED ORDER — ALBUTEROL SULFATE (2.5 MG/3ML) 0.083% IN NEBU: 2.5000 mg | INHALATION_SOLUTION | Freq: Four times a day (QID) | RESPIRATORY_TRACT | Status: DC

## 2011-07-16 MED ORDER — PREDNISONE 10 MG PO TABS: ORAL_TABLET | ORAL | Status: DC

## 2011-07-16 NOTE — Patient Instructions (Signed)
Use albuterol in nebulizer 4 times daily when brovana/budesonide runs out (can use up xopenex as welll) Stop brovana  And budesonide when current supply runs out Prednisone 10mg  Take 4 for two days three for two days two for two days one for two days Azithromycin 250mg  Take two once then one daily until gone Return 4 months
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
## 2011-07-17 NOTE — Assessment & Plan Note (Signed)
Golds III Copd primary emphysema  Recent hospitalization for copd exac now improved Best fit portable oxygen Plan Use albuterol in nebulizer 4 times daily when brovana/budesonide runs out (can use up xopenex as welll) Stop brovana  And budesonide when current supply runs out Prednisone 10mg  Take 4 for two days three for two days two for two days one for two days Azithromycin 250mg  Take two once then one daily until gone Return 4 months

## 2011-08-05 ENCOUNTER — Encounter: Payer: Self-pay | Admitting: Internal Medicine

## 2011-08-05 ENCOUNTER — Ambulatory Visit (INDEPENDENT_AMBULATORY_CARE_PROVIDER_SITE_OTHER): Payer: Medicare Other | Admitting: Internal Medicine

## 2011-08-05 VITALS — BP 152/80 | HR 84 | Temp 97.8°F | Ht 65.0 in | Wt 183.5 lb

## 2011-08-05 DIAGNOSIS — I10 Essential (primary) hypertension: Secondary | ICD-10-CM

## 2011-08-05 DIAGNOSIS — E785 Hyperlipidemia, unspecified: Secondary | ICD-10-CM

## 2011-08-05 DIAGNOSIS — Z0000 Encounter for general adult medical examination without abnormal findings: Secondary | ICD-10-CM | POA: Insufficient documentation

## 2011-08-05 DIAGNOSIS — K219 Gastro-esophageal reflux disease without esophagitis: Secondary | ICD-10-CM | POA: Insufficient documentation

## 2011-08-05 NOTE — Assessment & Plan Note (Signed)
Liver Function Tests:    Component Value Date/Time   AST 11 03/08/2009 2020   ALT 8 03/08/2009 2020   ALKPHOS 81 03/08/2009 2020   BILITOT 0.6 03/08/2009 2020   PROT 7.0 03/08/2009 2020   ALBUMIN 4.2 03/08/2009 2020    Lipid Panel:     Component Value Date/Time   CHOL 173 03/02/2011 0512   TRIG 47 03/02/2011 0512   HDL 65 03/02/2011 0512   CHOLHDL 2.7 03/02/2011 0512   VLDL 9 03/02/2011 0512   LDLCALC 99 03/02/2011 0512     Assessment: Status: Missing her pravastatin 1-2 times per week, very reluctant to change medications at this point to make it easier for her (was offered to change to simvastatin 40mg  daily instead of pravastatin 80mg  (2 tabs of the 40mg ).  Disease Control: controlled  Progress toward goals: at goal  Barriers to meeting goals: nonadherence to medications   Plan:  continue current medications  Check LFTs today.
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
## 2011-08-05 NOTE — Assessment & Plan Note (Signed)
Health Maintenance  Topic Date Due  . Tetanus/tdap  08/20/1952  . Colonoscopy  08/21/1983  . Zostavax  08/20/1993  . Influenza Vaccine  03/10/2012  . Pneumococcal Polysaccharide Vaccine Age 76 And Over  Completed    Assessment:  Due for colonoscopy, DEXA (last in 2006 T score -2.1), Tdap - refuses all today.  Plan:  Stool cards today.  She will call when she is ready to do the DEXA and then we can put in the order.

## 2011-08-05 NOTE — Assessment & Plan Note (Signed)
Assessment: Status: Contributing factors include none currently  Disease Control: controlled  Progress toward goals: at goal  Barriers to meeting goals: no barriers identified   Plan:      Continue PPI.  She should alert me if there are persistent symptoms, dysphagia, weight loss or GI bleeding.

## 2011-08-05 NOTE — Patient Instructions (Signed)
   Please follow-up at the clinic in 6-12 months, at which time we will reevaluate your blood pressure, preventative care issues - OR, please follow-up in the clinic sooner if needed.  There have not been changes in your medications.  Please call me when you are ready for DEXA scan  And colonoscopy.  Please complete stool cards and return them to the clinic.  If you have been started on new medication(s), and you develop symptoms concerning for allergic reaction, including, but not limited to, throat closing, tongue swelling, rash, please stop the medication immediately and call the clinic at [phone], and go to the ER.  If you are diabetic, please bring your meter to your next visit.  If symptoms worsen, or new symptoms arise, please call the clinic or go to the ER.  Please bring all of your medications in a bag to your next visit.

## 2011-08-05 NOTE — Assessment & Plan Note (Signed)
BP Readings from Last 3 Encounters:  08/05/11 152/80  07/16/11 146/82  04/01/11 123/64    Basic Metabolic Panel:    Component Value Date/Time   NA 143 03/05/2011 0525   K 4.0 03/05/2011 0525   CL 100 03/05/2011 0525   CO2 36* 03/05/2011 0525   BUN 26* 03/05/2011 0525   CREATININE 0.67 03/05/2011 0525   GLUCOSE 80 03/05/2011 0525   CALCIUM 9.0 03/05/2011 0525    Assessment: Status: Has not taken medication this AM.  Disease Control: not controlled  Progress toward goals: unchanged  Barriers to meeting goals: occasionally missing pills   Plan:  continue current medications  CMET today.

## 2011-08-06 LAB — COMPREHENSIVE METABOLIC PANEL
AST: 14 U/L (ref 0–37)
Albumin: 3.9 g/dL (ref 3.5–5.2)
Alkaline Phosphatase: 65 U/L (ref 39–117)
BUN: 12 mg/dL (ref 6–23)
CO2: 30 mEq/L (ref 19–32)
Calcium: 8.8 mg/dL (ref 8.4–10.5)
Chloride: 106 mEq/L (ref 96–112)
Potassium: 4 mEq/L (ref 3.5–5.3)
Total Protein: 6.3 g/dL (ref 6.0–8.3)

## 2011-08-20 ENCOUNTER — Other Ambulatory Visit (INDEPENDENT_AMBULATORY_CARE_PROVIDER_SITE_OTHER): Payer: Medicare Other

## 2011-08-20 DIAGNOSIS — Z0000 Encounter for general adult medical examination without abnormal findings: Secondary | ICD-10-CM

## 2011-08-20 DIAGNOSIS — Z1212 Encounter for screening for malignant neoplasm of rectum: Secondary | ICD-10-CM

## 2011-08-20 LAB — POC HEMOCCULT BLD/STL (HOME/3-CARD/SCREEN)

## 2011-09-16 ENCOUNTER — Telehealth: Payer: Self-pay | Admitting: *Deleted

## 2011-09-16 MED ORDER — ALBUTEROL SULFATE HFA 108 (90 BASE) MCG/ACT IN AERS: 2.0000 | INHALATION_SPRAY | RESPIRATORY_TRACT | Status: DC | PRN

## 2011-09-16 NOTE — Telephone Encounter (Signed)
Patient called was told [employer] recommended alternative drug for Ventolin.Dr.Ross [employer].

## 2011-09-16 NOTE — Telephone Encounter (Signed)
Fu call °Patient returning your call °

## 2011-09-16 NOTE — Telephone Encounter (Signed)
Left patient a message regarding [employer] RP  Recommendation to an alternative drug for Ventolin HFA A[facility]. The alternative drug name is  Proair HFA . Dr. Tenny Craw okay to use.

## 2011-09-17 ENCOUNTER — Telehealth: Payer: Self-pay | Admitting: *Deleted

## 2011-09-17 ENCOUNTER — Telehealth: Payer: Self-pay | Admitting: Critical Care Medicine

## 2011-09-17 NOTE — Telephone Encounter (Signed)
I spoke with [name] and advised her did not receive fax. She is going to fax this to triage fax #. Willa wait fax

## 2011-09-17 NOTE — Telephone Encounter (Signed)
Reviewed paperwork for [name] stating her [employer] will not cover her ventolin any longer--they will provide [name] in it's place--dr nishan has agreed to let her use this, but it needs to be ordered by her pulmonaligist Dr Marisa Sprinkles have called and faxed copy to dr Delford Field and his nurse to get [name] ordered by them--i called and made pt aware of all the above--nt

## 2011-09-18 NOTE — Telephone Encounter (Signed)
Per lori send to her since she is doing PA and refills

## 2011-09-18 NOTE — Telephone Encounter (Signed)
Pt's medication list already shows pt has been changed to the Proair. No PA is needed. RX for Proair sent to the pharmacy by Dr. Dietrich Pates. Pt is aware of the change and is fine with this.

## 2011-09-26 ENCOUNTER — Encounter: Payer: Self-pay | Admitting: Internal Medicine

## 2011-09-26 ENCOUNTER — Ambulatory Visit (INDEPENDENT_AMBULATORY_CARE_PROVIDER_SITE_OTHER): Payer: Medicare Other | Admitting: Internal Medicine

## 2011-09-26 DIAGNOSIS — E785 Hyperlipidemia, unspecified: Secondary | ICD-10-CM

## 2011-09-26 DIAGNOSIS — I119 Hypertensive heart disease without heart failure: Secondary | ICD-10-CM

## 2011-09-26 MED ORDER — PRAVASTATIN SODIUM 40 MG PO TABS: 80.0000 mg | ORAL_TABLET | Freq: Every day | ORAL | Status: DC

## 2011-09-26 MED ORDER — DILTIAZEM HCL 60 MG PO TABS: 30.0000 mg | ORAL_TABLET | Freq: Two times a day (BID) | ORAL | Status: DC

## 2011-09-26 MED ORDER — PANTOPRAZOLE SODIUM 40 MG PO TBEC: 40.0000 mg | DELAYED_RELEASE_TABLET | Freq: Every day | ORAL | Status: DC
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
## 2011-09-26 NOTE — Patient Instructions (Signed)
Your physician wants you to follow-up in:  12 months.  You will receive a reminder letter in the mail two months in advance. If you don't receive a letter, please call our office to schedule the follow-up appointment.   

## 2011-12-11 ENCOUNTER — Encounter: Payer: Self-pay | Admitting: Critical Care Medicine

## 2011-12-11 ENCOUNTER — Ambulatory Visit (INDEPENDENT_AMBULATORY_CARE_PROVIDER_SITE_OTHER): Payer: Medicare Other | Admitting: Critical Care Medicine

## 2011-12-11 VITALS — BP 134/78 | HR 67 | Temp 97.7°F | Ht 64.0 in | Wt 188.4 lb

## 2011-12-11 DIAGNOSIS — J449 Chronic obstructive pulmonary disease, unspecified: Secondary | ICD-10-CM

## 2011-12-11 MED ORDER — ALBUTEROL SULFATE HFA 108 (90 BASE) MCG/ACT IN AERS: 2.0000 | INHALATION_SPRAY | Freq: Four times a day (QID) | RESPIRATORY_TRACT | Status: DC | PRN
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
## 2011-12-11 NOTE — Patient Instructions (Addendum)
We will get you a lighter weight portable oxygen system Stay on albuterol in nebulizer 4times daily As needed hand held albuterol inhaler Return 6 months

## 2011-12-12 NOTE — Assessment & Plan Note (Signed)
Copd gold stage C Plan Get light weight portable oxygen No other changes needed

## 2012-04-13 ENCOUNTER — Encounter: Payer: Self-pay | Admitting: Internal Medicine

## 2012-04-13 ENCOUNTER — Ambulatory Visit (INDEPENDENT_AMBULATORY_CARE_PROVIDER_SITE_OTHER): Payer: Medicare Other | Admitting: Internal Medicine

## 2012-04-13 DIAGNOSIS — J449 Chronic obstructive pulmonary disease, unspecified: Secondary | ICD-10-CM

## 2012-04-13 DIAGNOSIS — Z23 Encounter for immunization: Secondary | ICD-10-CM

## 2012-04-13 DIAGNOSIS — Z0000 Encounter for general adult medical examination without abnormal findings: Secondary | ICD-10-CM

## 2012-04-13 DIAGNOSIS — I10 Essential (primary) hypertension: Secondary | ICD-10-CM

## 2012-04-13 DIAGNOSIS — J4489 Other specified chronic obstructive pulmonary disease: Secondary | ICD-10-CM

## 2012-04-13 MED ORDER — ALBUTEROL SULFATE HFA 108 (90 BASE) MCG/ACT IN AERS: 2.0000 | INHALATION_SPRAY | Freq: Four times a day (QID) | RESPIRATORY_TRACT | Status: DC | PRN

## 2012-04-13 NOTE — Patient Instructions (Addendum)
Please follow up with Dr Shan Levans tomorrow 04/14/2012 at 9am  Please continue with your current medications You will be seen again at this clinic in 3 months.

## 2012-04-14 ENCOUNTER — Ambulatory Visit: Payer: Medicare Other | Admitting: Critical Care Medicine
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
## 2012-04-14 NOTE — Assessment & Plan Note (Addendum)
Noted low oxygen sats at 82% on 3L/min with a demand oxygen oxygen pulsating devices. The patient however, not in respiratory distress and reports no increased symptoms at home. She reports that she some dizzy spells which could be related to hypoxia. Plan  - Referred to pulmonologist to determine her oxygen need. Appointment set for  11/5 at 9am - Albuterol inhalers has been prescribed

## 2012-04-14 NOTE — Progress Notes (Signed)
I saw the patient personally and agree with this plan. We have made arrangements for the patient to see her pulmonologist as soon as possible.   Thank you. [name]

## 2012-05-13 ENCOUNTER — Encounter: Payer: Self-pay | Admitting: Critical Care Medicine

## 2012-05-13 ENCOUNTER — Ambulatory Visit (INDEPENDENT_AMBULATORY_CARE_PROVIDER_SITE_OTHER): Payer: Medicare Other | Admitting: Critical Care Medicine

## 2012-05-13 VITALS — BP 128/80 | HR 88 | Temp 98.1°F | Ht 64.0 in | Wt 192.0 lb

## 2012-05-13 DIAGNOSIS — J449 Chronic obstructive pulmonary disease, unspecified: Secondary | ICD-10-CM
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
## 2012-05-13 NOTE — Patient Instructions (Addendum)
No change in medications. Return in          4 months     Use your portable oxygen system 3liters pulse

## 2012-05-14 NOTE — Assessment & Plan Note (Signed)
Gold stage C. COPD with primary emphysematous component and chronic respiratory failure Plan Maintain oxygen as prescribed 3 L with exertion 2 L rest Maintain nebulized therapy as prescribed

## 2012-05-20 ENCOUNTER — Other Ambulatory Visit: Payer: Self-pay | Admitting: Internal Medicine

## 2012-05-20 ENCOUNTER — Other Ambulatory Visit: Payer: Self-pay | Admitting: *Deleted

## 2012-05-20 DIAGNOSIS — E785 Hyperlipidemia, unspecified: Secondary | ICD-10-CM

## 2012-05-20 DIAGNOSIS — I10 Essential (primary) hypertension: Secondary | ICD-10-CM

## 2012-05-20 MED ORDER — PRAVASTATIN SODIUM 40 MG PO TABS: 80.0000 mg | ORAL_TABLET | Freq: Every day | ORAL | Status: DC

## 2012-05-20 MED ORDER — PRAVASTATIN SODIUM 40 MG PO TABS: 80.0000 mg | ORAL_TABLET | Freq: Every evening | ORAL | Status: DC

## 2012-05-20 NOTE — Telephone Encounter (Signed)
Rx called in to pharmacy. 

## 2012-05-21 ENCOUNTER — Other Ambulatory Visit: Payer: Self-pay

## 2012-05-21 DIAGNOSIS — E785 Hyperlipidemia, unspecified: Secondary | ICD-10-CM

## 2012-05-21 MED ORDER — PRAVASTATIN SODIUM 40 MG PO TABS: 80.0000 mg | ORAL_TABLET | Freq: Every evening | ORAL | Status: DC

## 2012-05-22 NOTE — Progress Notes (Signed)
Pt informed and will call in next week to set up a time for lab draw.

## 2012-05-22 NOTE — Addendum Note (Signed)
Addended by: Priscella Mann on: 05/22/2012 12:28 PM   Modules accepted: Orders

## 2012-05-27 ENCOUNTER — Other Ambulatory Visit (INDEPENDENT_AMBULATORY_CARE_PROVIDER_SITE_OTHER): Payer: Medicare Other

## 2012-05-27 DIAGNOSIS — E785 Hyperlipidemia, unspecified: Secondary | ICD-10-CM

## 2012-05-27 DIAGNOSIS — I10 Essential (primary) hypertension: Secondary | ICD-10-CM

## 2012-05-27 LAB — COMPREHENSIVE METABOLIC PANEL
ALT: 11 U/L (ref 0–35)
AST: 15 U/L (ref 0–37)
Alkaline Phosphatase: 64 U/L (ref 39–117)
BUN: 13 mg/dL (ref 6–23)
Creat: 0.81 mg/dL (ref 0.50–1.10)
Total Bilirubin: 0.8 mg/dL (ref 0.3–1.2)

## 2012-05-27 LAB — LIPID PANEL
HDL: 62 mg/dL (ref >39)
LDL Cholesterol: 113 mg/dL — ABNORMAL HIGH (ref 0–99)
Total CHOL/HDL Ratio: 3.1 Ratio
VLDL: 20 mg/dL (ref 0–40)

## 2012-05-28 ENCOUNTER — Encounter: Payer: Medicare Other | Admitting: Internal Medicine

## 2012-05-29 NOTE — Progress Notes (Signed)
Quick Note:  LDL is at her goal < 130 mg/dL. No change needed to current medications. ______

## 2012-06-08 ENCOUNTER — Emergency Department (HOSPITAL_COMMUNITY)
Admission: EM | Admit: 2012-06-08 | Discharge: 2012-06-08 | Disposition: A | Discharge status: Home / Self Care | Payer: Medicare Other | Attending: Emergency Medicine | Admitting: Emergency Medicine

## 2012-06-08 ENCOUNTER — Emergency Department (HOSPITAL_COMMUNITY): Payer: Medicare Other

## 2012-06-08 ENCOUNTER — Encounter (HOSPITAL_COMMUNITY): Payer: Self-pay | Admitting: Emergency Medicine

## 2012-06-08 DIAGNOSIS — I10 Essential (primary) hypertension: Secondary | ICD-10-CM | POA: Insufficient documentation

## 2012-06-08 DIAGNOSIS — M549 Dorsalgia, unspecified: Secondary | ICD-10-CM

## 2012-06-08 DIAGNOSIS — Z8739 Personal history of other diseases of the musculoskeletal system and connective tissue: Secondary | ICD-10-CM | POA: Insufficient documentation

## 2012-06-08 DIAGNOSIS — M1610 Unilateral primary osteoarthritis, unspecified hip: Secondary | ICD-10-CM | POA: Insufficient documentation

## 2012-06-08 DIAGNOSIS — M545 Low back pain, unspecified: Secondary | ICD-10-CM | POA: Insufficient documentation

## 2012-06-08 DIAGNOSIS — Z87828 Personal history of other (healed) physical injury and trauma: Secondary | ICD-10-CM | POA: Insufficient documentation

## 2012-06-08 DIAGNOSIS — Z87442 Personal history of urinary calculi: Secondary | ICD-10-CM | POA: Insufficient documentation

## 2012-06-08 DIAGNOSIS — J449 Chronic obstructive pulmonary disease, unspecified: Secondary | ICD-10-CM | POA: Insufficient documentation

## 2012-06-08 DIAGNOSIS — J4489 Other specified chronic obstructive pulmonary disease: Secondary | ICD-10-CM | POA: Insufficient documentation

## 2012-06-08 DIAGNOSIS — N390 Urinary tract infection, site not specified: Secondary | ICD-10-CM | POA: Insufficient documentation

## 2012-06-08 DIAGNOSIS — M169 Osteoarthritis of hip, unspecified: Secondary | ICD-10-CM | POA: Insufficient documentation

## 2012-06-08 DIAGNOSIS — Z79899 Other long term (current) drug therapy: Secondary | ICD-10-CM | POA: Insufficient documentation

## 2012-06-08 DIAGNOSIS — R110 Nausea: Secondary | ICD-10-CM | POA: Insufficient documentation

## 2012-06-08 DIAGNOSIS — J45909 Unspecified asthma, uncomplicated: Secondary | ICD-10-CM | POA: Insufficient documentation

## 2012-06-08 DIAGNOSIS — Z87891 Personal history of nicotine dependence: Secondary | ICD-10-CM | POA: Insufficient documentation

## 2012-06-08 DIAGNOSIS — E785 Hyperlipidemia, unspecified: Secondary | ICD-10-CM | POA: Insufficient documentation

## 2012-06-08 DIAGNOSIS — Z7982 Long term (current) use of aspirin: Secondary | ICD-10-CM | POA: Insufficient documentation

## 2012-06-08 LAB — URINALYSIS, ROUTINE W REFLEX MICROSCOPIC
Glucose, UA: NEGATIVE mg/dL
Protein, ur: NEGATIVE mg/dL
Urobilinogen, UA: 1 mg/dL (ref 0.0–1.0)

## 2012-06-08 LAB — URINE MICROSCOPIC-ADD ON

## 2012-06-08 MED ORDER — TRAMADOL HCL 50 MG PO TABS
50.0000 mg | ORAL_TABLET | Freq: Once | ORAL | Status: AC
  Administered 2012-06-08: 50 mg via ORAL
  Filled 2012-06-08: qty 1

## 2012-06-08 MED ORDER — HYDROCODONE-ACETAMINOPHEN 5-325 MG PO TABS
1.0000 | ORAL_TABLET | Freq: Once | ORAL | Status: AC
  Administered 2012-06-08: 1 via ORAL
  Filled 2012-06-08: qty 1

## 2012-06-08 MED ORDER — IBUPROFEN 800 MG PO TABS
800.0000 mg | ORAL_TABLET | Freq: Once | ORAL | Status: AC
  Administered 2012-06-08: 800 mg via ORAL
  Filled 2012-06-08: qty 1

## 2012-06-08 MED ORDER — SULFAMETHOXAZOLE-TRIMETHOPRIM 800-160 MG PO TABS: 1.0000 | ORAL_TABLET | Freq: Two times a day (BID) | ORAL | Status: DC

## 2012-06-08 MED ORDER — SULFAMETHOXAZOLE-TMP DS 800-160 MG PO TABS
1.0000 | ORAL_TABLET | Freq: Once | ORAL | Status: AC
  Administered 2012-06-08: 1 via ORAL
  Filled 2012-06-08: qty 1

## 2012-06-08 MED ORDER — HYDROCODONE-ACETAMINOPHEN 5-325 MG PO TABS: 1.0000 | ORAL_TABLET | Freq: Four times a day (QID) | ORAL | Status: DC | PRN

## 2012-06-08 MED ORDER — DIAZEPAM 5 MG PO TABS
5.0000 mg | ORAL_TABLET | Freq: Once | ORAL | Status: AC
  Administered 2012-06-08: 5 mg via ORAL
  Filled 2012-06-08: qty 1

## 2012-06-08 NOTE — ED Notes (Signed)
Pt presenting to ed with c/o lower back pain x 2 days that's worse today. Pt states she has been able to ambulate but is having a lot of pain. Pt states BC powder over the counter is not working. Pt states pain is wor with movement. Pt with tachypnea at this time. Pt states her shortness of breath is worse when she's in pain pt states she on 2L [facility] at home.
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
## 2012-06-10 LAB — URINE CULTURE: Colony Count: >=100000

## 2012-07-01 NOTE — Addendum Note (Signed)
Addended by: Neomia Dear on: 07/01/2012 07:27 PM   Modules accepted: Orders

## 2012-07-30 ENCOUNTER — Other Ambulatory Visit: Payer: Self-pay | Admitting: *Deleted

## 2012-07-30 MED ORDER — ALBUTEROL SULFATE (2.5 MG/3ML) 0.083% IN NEBU: 2.5000 mg | INHALATION_SOLUTION | Freq: Four times a day (QID) | RESPIRATORY_TRACT | Status: DC

## 2012-09-17 ENCOUNTER — Telehealth: Payer: Self-pay | Admitting: Critical Care Medicine

## 2012-09-17 NOTE — Telephone Encounter (Signed)
I called the pt to check on her and see what's going on, make her an appt if needed She states has no clue why NP would call here for that She is not having ANY cough at all, and doing very well I called and left a detailed msg for the NP who called advising that I spoke with the pt and she was doing well and we will not be making any changes to him medication regimen

## 2012-09-18 ENCOUNTER — Ambulatory Visit (INDEPENDENT_AMBULATORY_CARE_PROVIDER_SITE_OTHER): Payer: Medicare Other | Admitting: Internal Medicine

## 2012-09-18 ENCOUNTER — Encounter: Payer: Self-pay | Admitting: Internal Medicine

## 2012-09-18 VITALS — BP 140/87 | HR 65 | Ht 64.0 in | Wt 181.0 lb

## 2012-09-18 DIAGNOSIS — I498 Other specified cardiac arrhythmias: Secondary | ICD-10-CM

## 2012-09-18 DIAGNOSIS — I471 Supraventricular tachycardia: Secondary | ICD-10-CM

## 2012-09-18 DIAGNOSIS — E785 Hyperlipidemia, unspecified: Secondary | ICD-10-CM

## 2012-09-18 MED ORDER — ALBUTEROL SULFATE HFA 108 (90 BASE) MCG/ACT IN AERS: 2.0000 | INHALATION_SPRAY | Freq: Four times a day (QID) | RESPIRATORY_TRACT | Status: DC | PRN

## 2012-09-18 MED ORDER — PRAVASTATIN SODIUM 40 MG PO TABS: 80.0000 mg | ORAL_TABLET | Freq: Every evening | ORAL | Status: DC

## 2012-09-18 MED ORDER — ALBUTEROL SULFATE (2.5 MG/3ML) 0.083% IN NEBU: 2.5000 mg | INHALATION_SOLUTION | Freq: Four times a day (QID) | RESPIRATORY_TRACT | Status: DC

## 2012-09-18 MED ORDER — DILTIAZEM HCL 60 MG PO TABS: 30.0000 mg | ORAL_TABLET | Freq: Two times a day (BID) | ORAL | Status: DC
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
## 2012-09-18 NOTE — Patient Instructions (Addendum)
Your physician wants you to follow-up in: 1 year with Dr. Ross.  You will receive a reminder letter in the mail two months in advance. If you don't receive a letter, please call our office to schedule the follow-up appointment.  

## 2012-09-21 ENCOUNTER — Telehealth: Payer: Self-pay | Admitting: *Deleted

## 2012-09-21 NOTE — Telephone Encounter (Signed)
[name] called back in this evening during call hours because the office never got back with her today.  She says that she uses pro air HFA, no ventolin HFA and that her insurance will not cover and her pharmacy will not fill ventolin.  I reviewed her chart and noted that when trying to order pro air, Epic automatically defaults to albuterol (proventil/ventolin) on all database searches.  I advised that it did not appear that I would be able to settle this tonight and that I will forward to office staff for appropriate attention first thing in the morning.  She still has a pro air inhaler @ home with what she believes is 5 more doses of drug.

## 2012-09-21 NOTE — Telephone Encounter (Signed)
Pt states the she takes ProAir instead of Ventolin insurance will not fill ventolin., Will send to Dr. Tenny Craw medical assistant to get the ok on this

## 2012-09-22 ENCOUNTER — Other Ambulatory Visit: Payer: Self-pay | Admitting: *Deleted

## 2012-09-22 MED ORDER — PROAIR HFA 108 (90 BASE) MCG/ACT IN AERS: 2.0000 | INHALATION_SPRAY | Freq: Four times a day (QID) | RESPIRATORY_TRACT | Status: DC | PRN

## 2012-09-22 NOTE — Telephone Encounter (Signed)
Ordered ProAir Inh as "DAW".Marland Kitchen

## 2012-09-29 NOTE — Telephone Encounter (Signed)
She has been seen in pulmonary clinic This is really a question for them.  If anyone has influence on inhaler choice, pulmonary will more than cardiology

## 2012-10-27 ENCOUNTER — Encounter: Payer: Self-pay | Admitting: Critical Care Medicine

## 2012-10-27 ENCOUNTER — Ambulatory Visit (INDEPENDENT_AMBULATORY_CARE_PROVIDER_SITE_OTHER): Payer: Medicare Other | Admitting: Critical Care Medicine

## 2012-10-27 VITALS — BP 124/66 | HR 69 | Temp 98.3°F | Ht 62.0 in | Wt 189.8 lb

## 2012-10-27 DIAGNOSIS — J449 Chronic obstructive pulmonary disease, unspecified: Secondary | ICD-10-CM

## 2012-10-27 DIAGNOSIS — J9610 Chronic respiratory failure, unspecified whether with hypoxia or hypercapnia: Secondary | ICD-10-CM

## 2012-10-27 NOTE — Patient Instructions (Addendum)
No change in medications. Return in        6 months        
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
## 2012-10-28 DIAGNOSIS — J9610 Chronic respiratory failure, unspecified whether with hypoxia or hypercapnia: Secondary | ICD-10-CM | POA: Insufficient documentation

## 2012-10-28 NOTE — Assessment & Plan Note (Addendum)
Gold stage C. COPD with primary emphysematous component and oxygen dependent stable at this time Plan No change in inhaled or maintenance medications. Return in 6 months

## 2012-10-28 NOTE — Assessment & Plan Note (Signed)
Chronic respiratory failure oxygen dependent due to chronic obstructive lung disease Plan maintain oxygen 2 L rest 3 L exertion

## 2012-12-22 ENCOUNTER — Other Ambulatory Visit: Payer: Self-pay | Admitting: Internal Medicine

## 2013-03-12 ENCOUNTER — Other Ambulatory Visit: Payer: Self-pay | Admitting: Internal Medicine

## 2013-05-14 ENCOUNTER — Telehealth: Payer: Self-pay | Admitting: *Deleted

## 2013-05-14 DIAGNOSIS — E785 Hyperlipidemia, unspecified: Secondary | ICD-10-CM

## 2013-05-14 MED ORDER — PRAVASTATIN SODIUM 80 MG PO TABS: 80.0000 mg | ORAL_TABLET | Freq: Every evening | ORAL | Status: DC

## 2013-05-14 NOTE — Telephone Encounter (Signed)
Spoke with pt, we received notice from her insurance, as of the first of the year they will not cover pravastatin 40 mg 2 tablets once daily; therefore a new script for pravastatin 80 mg daily sent to pharm. Patient voiced understanding

## 2013-06-07 ENCOUNTER — Encounter (HOSPITAL_COMMUNITY): Payer: Self-pay | Admitting: Emergency Medicine

## 2013-06-07 ENCOUNTER — Inpatient Hospital Stay (HOSPITAL_COMMUNITY)
Admission: EM | Admit: 2013-06-07 | Discharge: 2013-06-11 | DRG: 190 | Disposition: A | Discharge status: Home Health | Payer: Medicare Other | Attending: Internal Medicine | Admitting: Internal Medicine

## 2013-06-07 ENCOUNTER — Emergency Department (HOSPITAL_COMMUNITY): Payer: Medicare Other

## 2013-06-07 DIAGNOSIS — I509 Heart failure, unspecified: Secondary | ICD-10-CM | POA: Diagnosis present

## 2013-06-07 DIAGNOSIS — J449 Chronic obstructive pulmonary disease, unspecified: Secondary | ICD-10-CM

## 2013-06-07 DIAGNOSIS — J189 Pneumonia, unspecified organism: Secondary | ICD-10-CM | POA: Diagnosis present

## 2013-06-07 DIAGNOSIS — Z8249 Family history of ischemic heart disease and other diseases of the circulatory system: Secondary | ICD-10-CM

## 2013-06-07 DIAGNOSIS — I5032 Chronic diastolic (congestive) heart failure: Secondary | ICD-10-CM | POA: Diagnosis present

## 2013-06-07 DIAGNOSIS — Z87891 Personal history of nicotine dependence: Secondary | ICD-10-CM

## 2013-06-07 DIAGNOSIS — Z9981 Dependence on supplemental oxygen: Secondary | ICD-10-CM

## 2013-06-07 DIAGNOSIS — J9610 Chronic respiratory failure, unspecified whether with hypoxia or hypercapnia: Secondary | ICD-10-CM | POA: Diagnosis present

## 2013-06-07 DIAGNOSIS — Z841 Family history of disorders of kidney and ureter: Secondary | ICD-10-CM

## 2013-06-07 DIAGNOSIS — Z23 Encounter for immunization: Secondary | ICD-10-CM

## 2013-06-07 DIAGNOSIS — IMO0002 Reserved for concepts with insufficient information to code with codable children: Secondary | ICD-10-CM

## 2013-06-07 DIAGNOSIS — I10 Essential (primary) hypertension: Secondary | ICD-10-CM | POA: Diagnosis present

## 2013-06-07 DIAGNOSIS — K219 Gastro-esophageal reflux disease without esophagitis: Secondary | ICD-10-CM | POA: Diagnosis present

## 2013-06-07 DIAGNOSIS — J441 Chronic obstructive pulmonary disease with (acute) exacerbation: Principal | ICD-10-CM | POA: Diagnosis present

## 2013-06-07 DIAGNOSIS — I471 Supraventricular tachycardia, unspecified: Secondary | ICD-10-CM | POA: Diagnosis present

## 2013-06-07 DIAGNOSIS — I498 Other specified cardiac arrhythmias: Secondary | ICD-10-CM | POA: Diagnosis present

## 2013-06-07 DIAGNOSIS — Z833 Family history of diabetes mellitus: Secondary | ICD-10-CM

## 2013-06-07 DIAGNOSIS — M899 Disorder of bone, unspecified: Secondary | ICD-10-CM

## 2013-06-07 DIAGNOSIS — Z7982 Long term (current) use of aspirin: Secondary | ICD-10-CM

## 2013-06-07 DIAGNOSIS — E785 Hyperlipidemia, unspecified: Secondary | ICD-10-CM | POA: Diagnosis present

## 2013-06-07 DIAGNOSIS — G5600 Carpal tunnel syndrome, unspecified upper limb: Secondary | ICD-10-CM | POA: Diagnosis present

## 2013-06-07 DIAGNOSIS — Z79899 Other long term (current) drug therapy: Secondary | ICD-10-CM

## 2013-06-07 DIAGNOSIS — Z823 Family history of stroke: Secondary | ICD-10-CM

## 2013-06-07 DIAGNOSIS — Z836 Family history of other diseases of the respiratory system: Secondary | ICD-10-CM

## 2013-06-07 LAB — CBC WITH DIFFERENTIAL/PLATELET
Basophils Absolute: 0 10*3/uL (ref 0.0–0.1)
Basophils Relative: 0 % (ref 0–1)
Eosinophils Absolute: 0.2 10*3/uL (ref 0.0–0.7)
Hemoglobin: 15.3 g/dL — ABNORMAL HIGH (ref 12.0–15.0)
MCHC: 33.3 g/dL (ref 30.0–36.0)
Monocytes Relative: 8 % (ref 3–12)
Neutro Abs: 2.7 10*3/uL (ref 1.7–7.7)
Neutrophils Relative %: 41 % — ABNORMAL LOW (ref 43–77)
Platelets: 257 10*3/uL (ref 150–400)
RDW: 13.5 % (ref 11.5–15.5)

## 2013-06-07 LAB — PRO B NATRIURETIC PEPTIDE: Pro B Natriuretic peptide (BNP): 200.4 pg/mL (ref 0–450)

## 2013-06-07 LAB — POCT I-STAT TROPONIN I: Troponin i, poc: 0 ng/mL (ref 0.00–0.08)

## 2013-06-07 MED ORDER — IPRATROPIUM BROMIDE 0.02 % IN SOLN
0.5000 mg | Freq: Once | RESPIRATORY_TRACT | Status: AC
  Administered 2013-06-07: 0.5 mg via RESPIRATORY_TRACT
  Filled 2013-06-07: qty 2.5

## 2013-06-07 MED ORDER — ALBUTEROL (5 MG/ML) CONTINUOUS INHALATION SOLN
INHALATION_SOLUTION | RESPIRATORY_TRACT | Status: AC
  Administered 2013-06-07: 15 mg/h via RESPIRATORY_TRACT
  Filled 2013-06-07: qty 20

## 2013-06-07 MED ORDER — CEFTRIAXONE SODIUM 1 G IJ SOLR
1.0000 g | Freq: Once | INTRAMUSCULAR | Status: AC
  Administered 2013-06-08: 1 g via INTRAVENOUS
  Filled 2013-06-07: qty 10

## 2013-06-07 MED ORDER — DEXTROSE 5 % IV SOLN
500.0000 mg | Freq: Once | INTRAVENOUS | Status: AC
  Administered 2013-06-08: 500 mg via INTRAVENOUS

## 2013-06-07 MED ORDER — IPRATROPIUM-ALBUTEROL 0.5-2.5 (3) MG/3ML IN SOLN: 3.0000 mL | RESPIRATORY_TRACT | Status: DC

## 2013-06-07 MED ORDER — ALBUTEROL (5 MG/ML) CONTINUOUS INHALATION SOLN
15.0000 mg/h | INHALATION_SOLUTION | RESPIRATORY_TRACT | Status: DC
  Administered 2013-06-07: 15 mg/h via RESPIRATORY_TRACT
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
## 2013-06-07 NOTE — ED Notes (Signed)
Per EMS - pt from home, pt w/ hx of COPD - pt became weak and dizzy while attempting to walk to the kitchen this evening - while attempting to get on EMS stretcher (approx 3 steps) pt reported to drop her oxygen saturation from 95% to 78% and HR increased from 80bpm to 140bpm - pt given x1 5mg  Alb tx and 125mg  solumedrol IV en route. Pt w/ diminished lung bases. Prior to EMS arrival pt reports using x4 alb tx and used her rescue inhaler multiple times w/o relief.

## 2013-06-08 DIAGNOSIS — J189 Pneumonia, unspecified organism: Secondary | ICD-10-CM | POA: Diagnosis present

## 2013-06-08 DIAGNOSIS — I10 Essential (primary) hypertension: Secondary | ICD-10-CM

## 2013-06-08 DIAGNOSIS — I519 Heart disease, unspecified: Secondary | ICD-10-CM

## 2013-06-08 DIAGNOSIS — J441 Chronic obstructive pulmonary disease with (acute) exacerbation: Principal | ICD-10-CM

## 2013-06-08 DIAGNOSIS — J9610 Chronic respiratory failure, unspecified whether with hypoxia or hypercapnia: Secondary | ICD-10-CM

## 2013-06-08 DIAGNOSIS — K219 Gastro-esophageal reflux disease without esophagitis: Secondary | ICD-10-CM

## 2013-06-08 DIAGNOSIS — I498 Other specified cardiac arrhythmias: Secondary | ICD-10-CM

## 2013-06-08 LAB — POCT I-STAT, CHEM 8
BUN: 11 mg/dL (ref 6–23)
Calcium, Ion: 1.15 mmol/L (ref 1.13–1.30)
Chloride: 103 mEq/L (ref 96–112)
Creatinine, Ser: 0.8 mg/dL (ref 0.50–1.10)
HCT: 49 % — ABNORMAL HIGH (ref 36.0–46.0)
Potassium: 3.6 mEq/L — ABNORMAL LOW (ref 3.7–5.3)
Sodium: 143 mEq/L (ref 137–147)
TCO2: 26 mmol/L (ref 0–100)

## 2013-06-08 LAB — POCT I-STAT 3, ART BLOOD GAS (G3+)
Bicarbonate: 28.2 mEq/L — ABNORMAL HIGH (ref 20.0–24.0)
O2 Saturation: 95 %
Patient temperature: 98.7
TCO2: 30 mmol/L (ref 0–100)
pCO2 arterial: 53.9 mmHg — ABNORMAL HIGH (ref 35.0–45.0)
pH, Arterial: 7.327 — ABNORMAL LOW (ref 7.350–7.450)
pO2, Arterial: 84 mmHg (ref 80.0–100.0)

## 2013-06-08 LAB — COMPREHENSIVE METABOLIC PANEL
ALT: 8 U/L (ref 0–35)
Albumin: 3.3 g/dL — ABNORMAL LOW (ref 3.5–5.2)
Alkaline Phosphatase: 66 U/L (ref 39–117)
BUN: 10 mg/dL (ref 6–23)
CO2: 29 mEq/L (ref 19–32)
Calcium: 8.7 mg/dL (ref 8.4–10.5)
GFR calc Af Amer: >90 mL/min (ref >90)
GFR calc non Af Amer: 80 mL/min — ABNORMAL LOW (ref >90)
Glucose, Bld: 225 mg/dL — ABNORMAL HIGH (ref 70–99)
Potassium: 3.9 mEq/L (ref 3.7–5.3)
Sodium: 141 mEq/L (ref 137–147)
Total Protein: 7.3 g/dL (ref 6.0–8.3)

## 2013-06-08 LAB — CBC WITH DIFFERENTIAL/PLATELET
Basophils Relative: 0 % (ref 0–1)
Eosinophils Absolute: 0 10*3/uL (ref 0.0–0.7)
Eosinophils Relative: 0 % (ref 0–5)
HCT: 45.4 % (ref 36.0–46.0)
Hemoglobin: 14.8 g/dL (ref 12.0–15.0)
Lymphocytes Relative: 4 % — ABNORMAL LOW (ref 12–46)
Lymphs Abs: 0.3 10*3/uL — ABNORMAL LOW (ref 0.7–4.0)
MCH: 31.9 pg (ref 26.0–34.0)
MCV: 97.8 fL (ref 78.0–100.0)
Monocytes Absolute: 0 10*3/uL — ABNORMAL LOW (ref 0.1–1.0)
Monocytes Relative: 0 % — ABNORMAL LOW (ref 3–12)
Platelets: 260 10*3/uL (ref 150–400)
RBC: 4.64 MIL/uL (ref 3.87–5.11)
RDW: 13.6 % (ref 11.5–15.5)
WBC: 7.3 10*3/uL (ref 4.0–10.5)

## 2013-06-08 LAB — MRSA PCR SCREENING: MRSA by PCR: NEGATIVE

## 2013-06-08 LAB — BASIC METABOLIC PANEL
BUN: 10 mg/dL (ref 6–23)
Calcium: 9.1 mg/dL (ref 8.4–10.5)
GFR calc Af Amer: 90 mL/min — ABNORMAL LOW (ref >90)
GFR calc non Af Amer: 77 mL/min — ABNORMAL LOW (ref >90)
Potassium: 4.3 mEq/L (ref 3.5–5.1)

## 2013-06-08 LAB — PROTIME-INR: Prothrombin Time: 12.6 seconds (ref 11.6–15.2)

## 2013-06-08 LAB — GLUCOSE, CAPILLARY
Glucose-Capillary: 195 mg/dL — ABNORMAL HIGH (ref 70–99)
Glucose-Capillary: 165 mg/dL — ABNORMAL HIGH (ref 70–99)

## 2013-06-08 LAB — STREP PNEUMONIAE URINARY ANTIGEN: Strep Pneumo Urinary Antigen: NEGATIVE

## 2013-06-08 MED ORDER — ALBUTEROL SULFATE (2.5 MG/3ML) 0.083% IN NEBU: 2.5000 mg | INHALATION_SOLUTION | RESPIRATORY_TRACT | Status: DC | PRN

## 2013-06-08 MED ORDER — DEXTROSE 5 % IV SOLN
1.0000 g | INTRAVENOUS | Status: DC
  Administered 2013-06-09 – 2013-06-11 (×3): 1 g via INTRAVENOUS
  Filled 2013-06-08 (×5): qty 10

## 2013-06-08 MED ORDER — METHYLPREDNISOLONE SODIUM SUCC 125 MG IJ SOLR
60.0000 mg | Freq: Four times a day (QID) | INTRAMUSCULAR | Status: DC
  Administered 2013-06-08 – 2013-06-10 (×10): 60 mg via INTRAVENOUS
  Filled 2013-06-08: qty 0.96
  Filled 2013-06-08: qty 2
  Filled 2013-06-08 (×11): qty 0.96

## 2013-06-08 MED ORDER — INFLUENZA VAC SPLIT QUAD 0.5 ML IM SUSP
0.5000 mL | INTRAMUSCULAR | Status: DC
  Filled 2013-06-08 (×2): qty 0.5

## 2013-06-08 MED ORDER — PRAVASTATIN SODIUM 40 MG PO TABS
80.0000 mg | ORAL_TABLET | Freq: Every day | ORAL | Status: DC
  Administered 2013-06-08 – 2013-06-10 (×3): 80 mg via ORAL
  Filled 2013-06-08 (×4): qty 2

## 2013-06-08 MED ORDER — SIMVASTATIN 20 MG PO TABS
20.0000 mg | ORAL_TABLET | Freq: Every day | ORAL | Status: DC
  Filled 2013-06-08: qty 1

## 2013-06-08 MED ORDER — DEXTROSE 5 % IV SOLN: 1.0000 g | INTRAVENOUS | Status: DC

## 2013-06-08 MED ORDER — DEXTROSE 5 % IV SOLN
500.0000 mg | INTRAVENOUS | Status: DC
  Administered 2013-06-09 – 2013-06-10 (×2): 500 mg via INTRAVENOUS
  Filled 2013-06-08 (×3): qty 500

## 2013-06-08 MED ORDER — ALBUTEROL SULFATE HFA 108 (90 BASE) MCG/ACT IN AERS: 2.0000 | INHALATION_SPRAY | Freq: Four times a day (QID) | RESPIRATORY_TRACT | Status: DC | PRN

## 2013-06-08 MED ORDER — ASPIRIN EC 81 MG PO TBEC
81.0000 mg | DELAYED_RELEASE_TABLET | Freq: Every day | ORAL | Status: DC
  Administered 2013-06-08 – 2013-06-11 (×4): 81 mg via ORAL
  Filled 2013-06-08 (×5): qty 1

## 2013-06-08 MED ORDER — PANTOPRAZOLE SODIUM 40 MG PO TBEC
40.0000 mg | DELAYED_RELEASE_TABLET | Freq: Every day | ORAL | Status: DC
  Administered 2013-06-08 – 2013-06-11 (×4): 40 mg via ORAL
  Filled 2013-06-08 (×4): qty 1

## 2013-06-08 MED ORDER — ENOXAPARIN SODIUM 40 MG/0.4ML ~~LOC~~ SOLN
40.0000 mg | SUBCUTANEOUS | Status: DC
  Administered 2013-06-08 – 2013-06-11 (×4): 40 mg via SUBCUTANEOUS
  Filled 2013-06-08 (×5): qty 0.4

## 2013-06-08 MED ORDER — IPRATROPIUM-ALBUTEROL 0.5-2.5 (3) MG/3ML IN SOLN
3.0000 mL | RESPIRATORY_TRACT | Status: DC
  Administered 2013-06-08 – 2013-06-11 (×18): 3 mL via RESPIRATORY_TRACT
  Filled 2013-06-08 (×60): qty 3

## 2013-06-08 MED ORDER — DILTIAZEM HCL 30 MG PO TABS
30.0000 mg | ORAL_TABLET | Freq: Two times a day (BID) | ORAL | Status: DC
  Administered 2013-06-08 – 2013-06-11 (×7): 30 mg via ORAL
  Filled 2013-06-08 (×10): qty 1

## 2013-06-08 MED ORDER — ALBUTEROL SULFATE (2.5 MG/3ML) 0.083% IN NEBU: 2.5000 mg | INHALATION_SOLUTION | Freq: Four times a day (QID) | RESPIRATORY_TRACT | Status: DC

## 2013-06-08 MED ORDER — SODIUM CHLORIDE 0.9 % IV SOLN
INTRAVENOUS | Status: DC
  Administered 2013-06-08 – 2013-06-09 (×2): via INTRAVENOUS

## 2013-06-08 MED ORDER — ACETAMINOPHEN 325 MG PO TABS
650.0000 mg | ORAL_TABLET | Freq: Four times a day (QID) | ORAL | Status: DC | PRN
  Administered 2013-06-08 – 2013-06-10 (×3): 650 mg via ORAL
  Filled 2013-06-08 (×3): qty 2

## 2013-06-08 MED ORDER — DEXTROSE 5 % IV SOLN: 500.0000 mg | INTRAVENOUS | Status: DC
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
## 2013-06-08 NOTE — Progress Notes (Addendum)
Inpatient Diabetes Program Recommendations  AACE/ADA: New Consensus Statement on Inpatient Glycemic Control (2013)  Target Ranges:  Prepandial:   less than 140 mg/dL      Peak postprandial:   less than 180 mg/dL (1-2 hours)      Critically ill patients:  140 - 180 mg/dL   Hyperglycemia while on high dose steroid therapy.  Inpatient Diabetes Program Recommendations Insulin - Basal: While on Solumedrol, please add basal insulin- start Lantus/levemir at 10 units. Correction (SSI): Please also check CBG's tid wc and HS: Start with sensitive correction tidwc.  Thank you, Lenor Coffin, RN, CNS, Diabetes Coordinator [phone])

## 2013-06-08 NOTE — Progress Notes (Signed)
Utilization review completed. Ivannia Willhelm, RN, BSN. 

## 2013-06-08 NOTE — ED Notes (Signed)
Pt reports that "when her water drops" - when she urinates - pt experiences episodes of dizziness and her HR increases to approx 200 bpm - on cardiac monitor pt noted to be in SVT during these episodes of dizziness. Dr. Allena Katz notified of pt condition.

## 2013-06-08 NOTE — Progress Notes (Signed)
MD requested to take off Bipap. MD at bedside at this time

## 2013-06-08 NOTE — Progress Notes (Signed)
Patient seen and evaluated earlier this morning by my associate.  Currently being treated for CAP. Patient feeling much better with resting HR at 67 on telemetry monitoring this AM.  Will plan on reassessing next am.  [name]

## 2013-06-08 NOTE — Progress Notes (Signed)
Echo Lab  2D Echocardiogram completed.  Asharia Lotter L Lelar Farewell, RDCS 06/08/2013 11:12 AM

## 2013-06-09 ENCOUNTER — Inpatient Hospital Stay (HOSPITAL_COMMUNITY): Payer: Medicare Other

## 2013-06-09 DIAGNOSIS — J449 Chronic obstructive pulmonary disease, unspecified: Secondary | ICD-10-CM

## 2013-06-09 DIAGNOSIS — M899 Disorder of bone, unspecified: Secondary | ICD-10-CM

## 2013-06-09 LAB — BASIC METABOLIC PANEL
BUN: 13 mg/dL (ref 6–23)
CO2: 31 mEq/L (ref 19–32)
Calcium: 8.3 mg/dL — ABNORMAL LOW (ref 8.4–10.5)
Creatinine, Ser: 0.68 mg/dL (ref 0.50–1.10)
GFR calc non Af Amer: 81 mL/min — ABNORMAL LOW (ref >90)
Glucose, Bld: 174 mg/dL — ABNORMAL HIGH (ref 70–99)
Potassium: 3.7 mEq/L (ref 3.7–5.3)

## 2013-06-09 MED ORDER — INFLUENZA VAC SPLIT QUAD 0.5 ML IM SUSP
0.5000 mL | INTRAMUSCULAR | Status: AC | PRN
  Administered 2013-06-11: 0.5 mL via INTRAMUSCULAR

## 2013-06-09 NOTE — Progress Notes (Signed)
[facility] PROGRESS NOTE  [name] RUE:[id number] DOB: 07/02/1931 DOA: 06/07/2013 PCP: Dietrich Pates, MD  HPI/Subjective: Feels better, still somewhat short of breath and having cough.  Assessment/Plan: Principal Problem:   COPD exacerbation Active Problems:   HYPERLIPIDEMIA   HYPERTENSION   SVT (supraventricular tachycardia)   GERD (gastroesophageal reflux disease)   CAP (community acquired pneumonia)   Acute COPD exacerbation -Patient with significant tachypnea, tachycardia next is wheezing. -Chest x-ray suggestive of pneumonia. -Patient started on ceftriaxone and azithromycin along with IV steroids. -Supportive management including mucolytics, antitussives, bronchodilators and oxygen as needed.  Pneumonia, community acquired -Chest x-ray showed possible pneumonia, I will repeat chest x-ray today after the hydration with IV fluids (PA and lateral). -Anyway patient is on ceftriaxone and azithromycin.  Sinus tachycardia with history of SVT -Monitor telemetry, 2-D echocardiogram. -Heart rate is more controlled now, likely inhaled beta agonist bronchodilators contributing to the tachycardia.  Hypertension -Continue home medications.  Code Status: Full code Family Communication: Plan discussed with the patient. Disposition Plan: Remains inpatient   Consultants:  None  Procedures:  None  Antibiotics:  Rocephin and azithromycin.   Objective: Filed Vitals:   06/09/13 0419  BP: 116/46  Pulse: 65  Temp: 97.6 F (36.4 C)  Resp: 20    Intake/Output Summary (Last 24 hours) at 06/09/13 1339 Last data filed at 06/08/13 1800  Gross per 24 hour  Intake    250 ml  Output      0 ml  Net    250 ml   Filed Weights   06/08/13 0615 06/09/13 0419  Weight: 86.7 kg (191 lb 2.2 oz) 84.9 kg (187 lb 2.7 oz)    Exam: General: Alert and awake, oriented x3, not in any acute distress. HEENT: anicteric sclera, pupils reactive to light and accommodation,  EOMI CVS: S1-S2 clear, no murmur rubs or gallops Chest: clear to auscultation bilaterally, no wheezing, rales or rhonchi Abdomen: soft nontender, nondistended, normal bowel sounds, no organomegaly Extremities: no cyanosis, clubbing or edema noted bilaterally Neuro: Cranial nerves II-XII intact, no focal neurological deficits  Data Reviewed: Basic Metabolic Panel:  Recent Labs Lab 06/07/13 2145 06/08/13 0130 06/08/13 0434 06/09/13 0600  NA 145 143 141 146  K 4.3 3.6* 3.9 3.7  CL 104 103 99 104  CO2 32  --  29 31  GLUCOSE 109* 201* 225* 174*  BUN 10 11 10 13   CREATININE 0.78 0.80 0.71 0.68  CALCIUM 9.1  --  8.7 8.3*   Liver Function Tests:  Recent Labs Lab 06/08/13 0434  AST 14  ALT 8  ALKPHOS 66  BILITOT 0.3  PROT 7.3  ALBUMIN 3.3*   No results found for this basename: LIPASE, AMYLASE,  in the last 168 hours No results found for this basename: AMMONIA,  in the last 168 hours CBC:  Recent Labs Lab 06/07/13 2145 06/08/13 0130 06/08/13 0434  WBC 6.7  --  7.3  NEUTROABS 2.7  --  6.9  HGB 15.3* 16.7* 14.8  HCT 46.0 49.0* 45.4  MCV 97.9  --  97.8  PLT 257  --  260   Cardiac Enzymes: No results found for this basename: CKTOTAL, CKMB, CKMBINDEX, TROPONINI,  in the last 168 hours BNP (last 3 results)  Recent Labs  06/07/13 2145  PROBNP 200.4   CBG:  Recent Labs Lab 06/08/13 0613 06/08/13 0818 06/08/13 1100  GLUCAP 195* 165* 152*    Micro Recent Results (from the past 240 hour(s))  CULTURE, BLOOD (ROUTINE X  2)     Status: None   Collection Time    06/07/13 11:45 PM      Result Value Range Status   Specimen Description BLOOD LEFT ARM   Final   Special Requests BOTTLES DRAWN AEROBIC AND ANAEROBIC 10CC EA   Final   Culture  Setup Time     Final   Value: 06/08/2013 04:55     Performed at [facility]   Culture     Final   Value:        BLOOD CULTURE RECEIVED NO GROWTH TO DATE CULTURE WILL BE HELD FOR 5 DAYS BEFORE ISSUING A FINAL NEGATIVE  REPORT     Performed at [facility]   Report Status PENDING   Incomplete  CULTURE, BLOOD (ROUTINE X 2)     Status: None   Collection Time    06/07/13 11:55 PM      Result Value Range Status   Specimen Description BLOOD RIGHT HAND   Final   Special Requests BOTTLES DRAWN AEROBIC ONLY 8CC   Final   Culture  Setup Time     Final   Value: 06/08/2013 04:54     Performed at [facility]   Culture     Final   Value:        BLOOD CULTURE RECEIVED NO GROWTH TO DATE CULTURE WILL BE HELD FOR 5 DAYS BEFORE ISSUING A FINAL NEGATIVE REPORT     Performed at [facility]   Report Status PENDING   Incomplete  MRSA PCR SCREENING     Status: None   Collection Time    06/08/13  7:00 AM      Result Value Range Status   MRSA by PCR NEGATIVE  NEGATIVE Final   Comment:            The GeneXpert MRSA Assay (FDA     approved for NASAL specimens     only), is one component of a     comprehensive MRSA colonization     surveillance program. It is not     intended to diagnose MRSA     infection nor to guide or     monitor treatment for     MRSA infections.     Studies: Dg Chest Portable 1 View  06/07/2013   CLINICAL DATA:  Shortness of breath.  EXAM: PORTABLE CHEST - 1 VIEW  COMPARISON:  03/04/2011  FINDINGS: Shallow inspiration. Cardiac enlargement with normal pulmonary vascularity. There is infiltration in both lung bases, increasing since the previous study. Pneumonia is not excluded. Linear opacities in the right mid lung probably represents scarring. Calcified and tortuous aorta. No pneumothorax. Old postoperative change versus congenital deformity of left ribs. Degenerative changes in the spine.  IMPRESSION: Shallow inspiration with increasing infiltration in the lung bases. Possible pneumonia. Cardiac enlargement without vascular congestion.   Electronically Signed   By: Burman Nieves M.D.   On: 06/07/2013 22:29    Scheduled Meds: . aspirin EC  81 mg Oral Daily  .  azithromycin  500 mg Intravenous Q24H  . cefTRIAXone (ROCEPHIN)  IV  1 g Intravenous Q24H  . diltiazem  30 mg Oral BID  . enoxaparin (LOVENOX) injection  40 mg Subcutaneous Q24H  . ipratropium-albuterol  3 mL Nebulization Q4H  . methylPREDNISolone (SOLU-MEDROL) injection  60 mg Intravenous Q6H  . pantoprazole  40 mg Oral Daily  . pravastatin  80 mg Oral q1800   Continuous Infusions: . sodium chloride 50  mL/hr at 06/09/13 0321  . albuterol 15 mg/hr (06/07/13 2223)       Time spent: 35 minutes    [facility] A  [facility] Pager [phone] If 7PM-7AM, please contact night-coverage at [email or web address], password [facility] 06/09/2013, 1:39 PM  LOS: 2 days

## 2013-06-09 NOTE — Care Management (Signed)
Pt placed on cpap for the night with 3 lt bleed in O2. Will monitor

## 2013-06-10 DIAGNOSIS — I5032 Chronic diastolic (congestive) heart failure: Secondary | ICD-10-CM

## 2013-06-10 LAB — BASIC METABOLIC PANEL
BUN: 15 mg/dL (ref 6–23)
CO2: 29 mEq/L (ref 19–32)
Calcium: 8.1 mg/dL — ABNORMAL LOW (ref 8.4–10.5)
Chloride: 102 mEq/L (ref 96–112)
Creatinine, Ser: 0.75 mg/dL (ref 0.50–1.10)
GFR calc Af Amer: >90 mL/min (ref >90)
GFR calc non Af Amer: 78 mL/min — ABNORMAL LOW (ref >90)
Glucose, Bld: 181 mg/dL — ABNORMAL HIGH (ref 70–99)
Potassium: 3.7 mEq/L (ref 3.7–5.3)
Sodium: 145 mEq/L (ref 137–147)

## 2013-06-10 MED ORDER — AZITHROMYCIN 500 MG PO TABS
500.0000 mg | ORAL_TABLET | ORAL | Status: DC
  Administered 2013-06-10: 19:00:00 500 mg via ORAL
  Filled 2013-06-10 (×2): qty 1

## 2013-06-10 MED ORDER — AZITHROMYCIN 250 MG PO TABS
500.0000 mg | ORAL_TABLET | ORAL | Status: DC
  Filled 2013-06-10: qty 1
  Filled 2013-06-10: qty 2

## 2013-06-10 MED ORDER — METHYLPREDNISOLONE SODIUM SUCC 125 MG IJ SOLR
60.0000 mg | Freq: Two times a day (BID) | INTRAMUSCULAR | Status: DC
Start: 2013-06-10 — End: 2013-06-11
  Administered 2013-06-10 – 2013-06-11 (×2): 60 mg via INTRAVENOUS
  Filled 2013-06-10 (×3): qty 0.96

## 2013-06-10 MED ORDER — AZITHROMYCIN 500 MG PO TABS
500.0000 mg | ORAL_TABLET | ORAL | Status: DC
  Filled 2013-06-10: qty 1

## 2013-06-10 NOTE — Progress Notes (Addendum)
[facility] PROGRESS NOTE  [name] FBP:[id number] DOB: February 12, 1932 DOA: 06/07/2013 PCP: Dorris Carnes, MD  HPI/Subjective: Dyspnea on exertion, but apparently this is the patient's baseline. She has minimal ambulation, and most spent time in bed and in chair  Assessment/Plan: Principal Problem:   COPD exacerbation Active Problems:   HYPERLIPIDEMIA   HYPERTENSION   SVT (supraventricular tachycardia)   GERD (gastroesophageal reflux disease)   CAP (community acquired pneumonia)   Chronic diastolic heart failure   Acute COPD exacerbation -Decreased tachypnea. -Chest x-ray suggestive of pneumonia. -Patient started on ceftriaxone and azithromycin along with IV steroids. -Supportive management including mucolytics, antitussives, bronchodilators and oxygen as needed. Weaning down steroids., Exam much clear. Much closer to baseline Had extensive discussion with patient and daughter. The daughter really concerned about patient's overall status. She is on chronic oxygen. However, there are times when she takes off her oxygen when she cooks. Or when she goes upstairs to use the bathroom. Reinforced need to keep on oxygen as much as possible. She likely will need to increase her oxygen baseline from 2/3 L to 3 L continuous and 4 L with exertion  Pneumonia, community acquired -Chest x-ray showed possible pneumonia, I will repeat chest x-ray today after the hydration with IV fluids (PA and lateral)-. Unremarkable. -Anyway patient is on ceftriaxone and azithromycin.  Sinus tachycardia with history of SVT -Monitor telemetry, 2-D echocardiogram. -Heart rate is more controlled now, likely inhaled beta agonist bronchodilators contributing to the tachycardia.  Chronic diastolic heart failure: Likely cor pulmonale. BNP normal  Hypertension -Continue home medications.  Code Status: Full code Family Communication: Plan discussed with the patient. Disposition Plan: Home tomorrow with home  health   Consultants:  None  Procedures:  None    Antibiotics:  Rocephin and azithromycin.   Objective: Filed Vitals:   06/10/13 1603  BP: 146/68  Pulse: 84  Temp: 98.4 F (36.9 C)  Resp: 20    Intake/Output Summary (Last 24 hours) at 06/10/13 1654 Last data filed at 06/10/13 0929  Gross per 24 hour  Intake   1690 ml  Output      0 ml  Net   1690 ml   Filed Weights   06/08/13 0615 06/09/13 0419 06/10/13 0528  Weight: 86.7 kg (191 lb 2.2 oz) 84.9 kg (187 lb 2.7 oz) 86.909 kg (191 lb 9.6 oz)    Exam: General: Alert and awake, oriented x3, not in any acute distress. CVS: S1-S2 clear, no murmur rubs or gallops Decreased breath sounds throughout Abdomen: soft nontender, nondistended, normal bowel sounds, no organomegaly Extremities: no cyanosis, clubbing or edema noted bilaterally   Data Reviewed: Basic Metabolic Panel:  Recent Labs Lab 06/07/13 2145 06/08/13 0130 06/08/13 0434 06/09/13 0600 06/10/13 0549  NA 145 143 141 146 145  K 4.3 3.6* 3.9 3.7 3.7  CL 104 103 99 104 102  CO2 32  --  29 31 29   GLUCOSE 109* 201* 225* 174* 181*  BUN 10 11 10 13 15   CREATININE 0.78 0.80 0.71 0.68 0.75  CALCIUM 9.1  --  8.7 8.3* 8.1*   Liver Function Tests:  Recent Labs Lab 06/08/13 0434  AST 14  ALT 8  ALKPHOS 66  BILITOT 0.3  PROT 7.3  ALBUMIN 3.3*   No results found for this basename: LIPASE, AMYLASE,  in the last 168 hours No results found for this basename: AMMONIA,  in the last 168 hours CBC:  Recent Labs Lab 06/07/13 2145 06/08/13 0130 06/08/13 0434  WBC 6.7  --  7.3  NEUTROABS 2.7  --  6.9  HGB 15.3* 16.7* 14.8  HCT 46.0 49.0* 45.4  MCV 97.9  --  97.8  PLT 257  --  260   Cardiac Enzymes: No results found for this basename: CKTOTAL, CKMB, CKMBINDEX, TROPONINI,  in the last 168 hours BNP (last 3 results)  Recent Labs  06/07/13 2145  PROBNP 200.4   CBG:  Recent Labs Lab 06/08/13 0613 06/08/13 0818 06/08/13 1100  GLUCAP 195*  165* 152*    Micro Recent Results (from the past 240 hour(s))  CULTURE, BLOOD (ROUTINE X 2)     Status: None   Collection Time    06/07/13 11:45 PM      Result Value Range Status   Specimen Description BLOOD LEFT ARM   Final   Special Requests BOTTLES DRAWN AEROBIC AND ANAEROBIC 10CC EA   Final   Culture  Setup Time     Final   Value: 06/08/2013 04:55     Performed at [facility]   Culture     Final   Value:        BLOOD CULTURE RECEIVED NO GROWTH TO DATE CULTURE WILL BE HELD FOR 5 DAYS BEFORE ISSUING A FINAL NEGATIVE REPORT     Performed at [facility]   Report Status PENDING   Incomplete  CULTURE, BLOOD (ROUTINE X 2)     Status: None   Collection Time    06/07/13 11:55 PM      Result Value Range Status   Specimen Description BLOOD RIGHT HAND   Final   Special Requests BOTTLES DRAWN AEROBIC ONLY 8CC   Final   Culture  Setup Time     Final   Value: 06/08/2013 04:54     Performed at [facility]   Culture     Final   Value:        BLOOD CULTURE RECEIVED NO GROWTH TO DATE CULTURE WILL BE HELD FOR 5 DAYS BEFORE ISSUING A FINAL NEGATIVE REPORT     Performed at [facility]   Report Status PENDING   Incomplete  MRSA PCR SCREENING     Status: None   Collection Time    06/08/13  7:00 AM      Result Value Range Status   MRSA by PCR NEGATIVE  NEGATIVE Final   Comment:            The GeneXpert MRSA Assay (FDA     approved for NASAL specimens     only), is one component of a     comprehensive MRSA colonization     surveillance program. It is not     intended to diagnose MRSA     infection nor to guide or     monitor treatment for     MRSA infections.     Studies: Dg Chest 2 View  06/09/2013   CLINICAL DATA:  Shortness of breath.  EXAM: CHEST  2 VIEW  COMPARISON:  06/07/2013 and 03/04/2011 as well as 06/03/2009  FINDINGS: Lungs are adequately inflated and demonstrate mild bronchovascular markings in the bases which are chronic. No definite  acute consolidation or effusion. Stable cardiomegaly. Calcified plaque over the aortic arch is present. There are degenerative changes of the spine.  IMPRESSION: No acute cardiopulmonary disease.  Chronic stable changes in the lung bases.  Stable cardiomegaly.   Electronically Signed   By: Marin Olp M.D.   On: 06/09/2013 15:45  Scheduled Meds: . aspirin EC  81 mg Oral Daily  . [START ON 06/11/2013] azithromycin  500 mg Oral Q24H  . cefTRIAXone (ROCEPHIN)  IV  1 g Intravenous Q24H  . diltiazem  30 mg Oral BID  . enoxaparin (LOVENOX) injection  40 mg Subcutaneous Q24H  . ipratropium-albuterol  3 mL Nebulization Q4H  . methylPREDNISolone (SOLU-MEDROL) injection  60 mg Intravenous Q12H  . pantoprazole  40 mg Oral Daily  . pravastatin  80 mg Oral q1800   Continuous Infusions: . albuterol 15 mg/hr (06/07/13 2223)       Time spent: 25 minutes    [facility] Pager [phone] If 7PM-7AM, please contact night-coverage at [email or web address], password [facility] 06/10/2013, 4:54 PM  LOS: 3 days

## 2013-06-11 LAB — LEGIONELLA ANTIGEN, URINE: Legionella Antigen, Urine: NEGATIVE

## 2013-06-11 MED ORDER — SIMVASTATIN 40 MG PO TABS: 40.0000 mg | ORAL_TABLET | Freq: Every day | ORAL | Status: DC

## 2013-06-11 MED ORDER — PANTOPRAZOLE SODIUM 40 MG PO TBEC: 40.0000 mg | DELAYED_RELEASE_TABLET | Freq: Two times a day (BID) | ORAL | Status: DC

## 2013-06-11 MED ORDER — DILTIAZEM HCL 60 MG PO TABS: 30.0000 mg | ORAL_TABLET | Freq: Two times a day (BID) | ORAL | Status: DC

## 2013-06-11 MED ORDER — PREDNISONE 10 MG PO TABS: ORAL_TABLET | ORAL | Status: DC

## 2013-06-11 MED ORDER — AZITHROMYCIN 500 MG PO TABS: 500.0000 mg | ORAL_TABLET | ORAL | Status: AC

## 2013-06-11 MED ORDER — ALBUTEROL SULFATE HFA 108 (90 BASE) MCG/ACT IN AERS: 2.0000 | INHALATION_SPRAY | Freq: Four times a day (QID) | RESPIRATORY_TRACT | Status: DC | PRN

## 2013-06-11 MED ORDER — ALBUTEROL SULFATE (2.5 MG/3ML) 0.083% IN NEBU: 2.5000 mg | INHALATION_SOLUTION | Freq: Four times a day (QID) | RESPIRATORY_TRACT | Status: DC

## 2013-06-11 MED ORDER — IPRATROPIUM BROMIDE 0.02 % IN SOLN: 0.5000 mg | Freq: Four times a day (QID) | RESPIRATORY_TRACT | Status: DC

## 2013-06-11 MED ORDER — IPRATROPIUM-ALBUTEROL 0.5-2.5 (3) MG/3ML IN SOLN
3.0000 mL | Freq: Four times a day (QID) | RESPIRATORY_TRACT | Status: DC
  Administered 2013-06-11 (×2): 3 mL via RESPIRATORY_TRACT
  Filled 2013-06-11 (×2): qty 3

## 2013-06-11 NOTE — Progress Notes (Signed)
   CARE MANAGEMENT NOTE 06/11/2013  Patient:  [name], [name]   Account Number:  [id number]  Date Initiated:  06/08/2013  Documentation initiated by:  Elissa Hefty  Subjective/Objective Assessment:   adm w copd exacerb     Action/Plan:   lives w fam, pcp dr Dorris Carnes, hx of o2 and [facility] w [facility]   Anticipated DC Date:  06/11/2013   Anticipated DC Plan:  [place]  CM consult      [facility] Choice  HOME HEALTH   Choice offered to / List presented to:  C-1 Patient        [place] arranged  HH-1 RN      [place].   Status of service:  Completed, signed off Medicare Important Message given?   (If response is "NO", the following Medicare IM given date fields will be blank) Date Medicare IM given:   Date Additional Medicare IM given:    Discharge Disposition:  [place]  Per UR Regulation:  Reviewed for med. necessity/level of care/duration of stay  If discussed at [place] of Stay Meetings, dates discussed:    Comments:  06/11/2013 1400 NCM spoke to pt and she would like a light weight portable tank. Made [facility] rep aware. Will have [place] [facility] follow up and evaluate. Offered choice for [facility]. Pt agreeable to [facility] with [facility]. States she has oxygen with [facility]. Notified [facility] rep for [facility] for scheduled dc home today. Jonnie Finner RN CCM Case Mgmt phone [phone]

## 2013-06-11 NOTE — Discharge Instructions (Signed)
Chronic Obstructive Pulmonary Disease °Chronic obstructive pulmonary disease (COPD) is a lung disease. The lungs become damaged. This makes it hard to get air in and out of your lungs. The damage to your lungs cannot be changed. There are things you can do to improve the lungs and make you feel better. °HOME CARE °· Take all medicines as told by your doctor. °· Use medicines that you breathe in (inhale) as told by your doctor. °· Avoid medicines or cough syrups that dry up your airway (antihistamines) and do not allow you to get rid of thick spit. °· If you smoke, stop. °· Avoid being around smoke, chemicals, and fumes that bother your breathing. °· Avoid people that have a catchy (contagious) sickness. °· Avoid going outside when it is very hot, cold, or humid. °· Use humidifiers in your home and near your bedside if it helps your breathing. °· Drink enough fluids to keep your pee (urine) clear or pale yellow. °· Eat healthy foods. Eat smaller meals more often and rest before meals. °· Ask your doctor if it is okay to take vitamins or pills with minerals (supplements). °· Exercise and stay active. °· Rest with activity. °· Get into a comfortable position when you have trouble breathing. °· Learn and use tips on how to relax. °· Learn and use tips on how to control your breathing as told by your doctor. Try: °· Breathing in through your nose for 1 second. Then, breath out (exhale) through your puckered (like a whistle) lips for 2 seconds. °· Putting one hand on your belly (abdomen). Breathe in slowly through your nose. Your hand on your belly should move out. Breathe out slowly through your puckered lips. Your hand on your belly should move in as you breathe out. °· Learn and use controlled coughing to clear thick spit from your lungs. °1. Lean your head slightly forward. °2. Breathe in deeply. °3. Try to hold your breath for 3 seconds. °4. Keep your mouth slightly open while coughing 2 times. °5. Spit any thick  spit out into a tissue. °6. Rest and do the steps again 1 or 2 times as needed. °· Get all shots (vaccines) a told by your doctor. °· Learn how to manage stress. °· Schedule and go to all follow-up doctor visits. °· Go to therapy that can help you improve your lungs (pulmonary rehabilitation) as told by your doctor. °· Use oxygen at home as told by your doctor. °GET HELP RIGHT AWAY IF:  °· You can feel your heart beating really fast. °· You have shortness of breath while resting. °· You have shortness of breath that stops you from being able to talk. °· You have shortness of breath that stops you from doing normal activities. °· You have chest pain lasting longer than 5 minutes. °· You start to shake uncontrollably (seizure). °· Your family or friends notice that you are flustered or confused. °· You cough up more thick spit than usual. °· There is a change in the color or thickness of the spit. °· Breathing is more difficult than usual. °· Your breathing is faster than usual. °· Your skin color is more blueish than usual. °· You are running out of the medicine you take for breathing. °· You are anxious, uneasy, fearful, or restless. °· You have a fever. °MAKE SURE YOU:  °· Understand these instructions. °· Will watch your condition. °· Will get help right away if you are not doing well or get worse. °  Document Released: 11/13/2007 Document Revised: 05/13/2012 Document Reviewed: 01/21/2013 [facility] Patient Information 2014 [place], [place].

## 2013-06-11 NOTE — Progress Notes (Signed)
NURSING PROGRESS NOTE  [name] [id number] Discharge Data: 06/11/2013 3:28 PM Attending Provider: Kinnie Feil, MD KWI:OXBDZ Harrington Challenger, MD     [place] Doctor to be D/C'd Home per MD order.  Discussed with the patient the After Visit Summary and all questions fully answered. All IV's discontinued with no bleeding noted. All belongings returned to patient for patient to take home.   Last Vital Signs:  Blood pressure 130/61, pulse 73, temperature 98.4 F (36.9 C), temperature source Oral, resp. rate 20, height 5\' 4"  (1.626 m), weight 89.9 kg (198 lb 3.1 oz), SpO2 95.00%.  Discharge Medication List   Medication List    STOP taking these medications       pravastatin 40 MG tablet  Commonly known as:  PRAVACHOL      TAKE these medications       albuterol (2.5 MG/3ML) 0.083% nebulizer solution  Commonly known as:  PROVENTIL  Take 3 mLs (2.5 mg total) by nebulization 4 (four) times daily.     albuterol 108 (90 BASE) MCG/ACT inhaler  Commonly known as:  PROAIR HFA  Inhale 2 puffs into the lungs every 6 (six) hours as needed for wheezing or shortness of breath.     aspirin EC 81 MG tablet  Take 81 mg by mouth daily as needed.     azithromycin 500 MG tablet  Commonly known as:  ZITHROMAX  Take 1 tablet (500 mg total) by mouth daily.     BC HEADACHE POWDER PO  Take 0.5-1 packets by mouth 2 (two) times daily as needed. For pain.     diltiazem 60 MG tablet  Commonly known as:  CARDIZEM  Take 0.5 tablets (30 mg total) by mouth 2 (two) times daily.     ipratropium 0.02 % nebulizer solution  Commonly known as:  ATROVENT  Take 2.5 mLs (0.5 mg total) by nebulization 4 (four) times daily.     pantoprazole 40 MG tablet  Commonly known as:  PROTONIX  Take 1 tablet (40 mg total) by mouth 2 (two) times daily.     predniSONE 10 MG tablet  Commonly known as:  DELTASONE  50 mg bid x 1 day, then 40mg  bid on day 2, then decrease by 10mg  bid until finished.     simvastatin 40 MG  tablet  Commonly known as:  ZOCOR  Take 1 tablet (40 mg total) by mouth daily.

## 2013-06-12 NOTE — ED Provider Notes (Signed)
I saw and evaluated the patient, reviewed the resident's note and I agree with the findings and plan.  EKG Interpretation    Date/Time:  Monday June 07 2013 21:40:09 EST Ventricular Rate:  98 PR Interval:  163 QRS Duration: 84 QT Interval:  349 QTC Calculation: 446 R Axis:   -2 Text Interpretation:  Sinus rhythm Borderline T wave abnormalities No longer SVT Confirmed by Sloane Junkin  MD, Emory Gallentine ([id number]) on 06/07/2013 9:56:58 PM            Patient presents with hypoxia and SOB.  Presentation most consistent with COPD.  INcreased WOB and placed on BiPAP.  Covered for CAP.  Will admit for further management.  Merryl Hacker, MD 06/12/13 [phone]

## 2013-06-12 NOTE — Discharge Summary (Signed)
Physician Discharge Summary  [name] YHC:[id number] DOB: May 30, 1932 DOA: 06/07/2013  PCP: Dorris Carnes, MD  Admit date: 06/07/2013 Discharge date: 06/12/2013  Time spent: 25 minutes  Recommendations for Outpatient Follow-up:  1. Patient already on home oxygen, being discharged on 3 L at rest, 4 L with exertion, which is an increase from 2/3 L 2. Home health R.N. will followup  Discharge Diagnoses:  Principal Problem:   COPD exacerbation Active Problems:   HYPERLIPIDEMIA   HYPERTENSION   SVT (supraventricular tachycardia)   GERD (gastroesophageal reflux disease)   CAP (community acquired pneumonia)   Chronic diastolic heart failure   Discharge Condition: Improved, being discharged home  Diet recommendation: Heart healthy  Filed Weights   06/09/13 0419 06/10/13 0528 06/11/13 0537  Weight: 84.9 kg (187 lb 2.7 oz) 86.909 kg (191 lb 9.6 oz) 89.9 kg (198 lb 3.1 oz)    History of present illness:  80 year old female with past medical history of COPD on chronic oxygen plus hypertension who lives at home with her daughter presented on 12/29 night with complaints of shortness of breath progressively worsening and found to be in SVT and COPD exacerbation. Patient initially placed in the step down unit.  Hospital Course:  Principal Problem:   COPD exacerbation: By the following day, patient much more stable and transfer to floor. Chest x-ray suggestive of pneumonia and patient started on IV antibiotics plus steroids. Steroids on rapid taper. Oxygen recommended to increase to 3 L on rest, 4 L on exertion, increased from her baseline of 2/3 L. Patient discharged on several more days of oral antibiotics to complete a seven-day course. Active Problems:   HYPERLIPIDEMIA: Stable. Patient's statin medication changed as per her request as she was told by her primary care office that her old pravastatin was no longer covered by insurance    HYPERTENSION: Stable. Continue home meds.    SVT  (supraventricular tachycardia): Once breathing stabilized, heart rate controlled. Felt to be contributed by inhaled beta agonist bronchodilators causing tachycardia. 2-D echocardiogram unremarkable. Continued on home Cardizem.    GERD (gastroesophageal reflux disease)   CAP (community acquired pneumonia): Discharge on by mouth antibiotics    Chronic diastolic heart failure: Patient had echocardiogram ordered on admission. Echocardiogram noted grade 1 diastolic dysfunction, but BNP was normal. Patient given education. Did not order beta blocker and discharge given history of advanced COPD. Her diuretic given as patient was stable without acute failure despite no diuretic. Concerned about unstable gait and orthostatic hypotension if we did so. Patient will followup with her PCP. Likely this is cor pulmonale.  Procedures:  None  Consultations:  None  Discharge Exam: Filed Vitals:   06/11/13 0509  BP: 130/61  Pulse: 73  Temp: 98.4 F (36.9 C)  Resp: 20    General: Alert and oriented x3, dyspnea with exertion, but this is the patient's baseline Cardiovascular: Regular rate and rhythm, D1-V6, soft 2/6 systolic ejection murmur Respiratory: Decreased breath sounds throughout  Discharge Instructions  Discharge Orders   Future Orders Complete By Expires   Diet - low sodium heart healthy  As directed    Increase activity slowly  As directed        Medication List    STOP taking these medications       pravastatin 40 MG tablet  Commonly known as:  PRAVACHOL      TAKE these medications       albuterol (2.5 MG/3ML) 0.083% nebulizer solution  Commonly known as:  PROVENTIL  Take 3 mLs (2.5 mg total) by nebulization 4 (four) times daily.     albuterol 108 (90 BASE) MCG/ACT inhaler  Commonly known as:  PROAIR HFA  Inhale 2 puffs into the lungs every 6 (six) hours as needed for wheezing or shortness of breath.     aspirin EC 81 MG tablet  Take 81 mg by mouth daily as needed.      azithromycin 500 MG tablet  Commonly known as:  ZITHROMAX  Take 1 tablet (500 mg total) by mouth daily.     BC HEADACHE POWDER PO  Take 0.5-1 packets by mouth 2 (two) times daily as needed. For pain.     diltiazem 60 MG tablet  Commonly known as:  CARDIZEM  Take 0.5 tablets (30 mg total) by mouth 2 (two) times daily.     ipratropium 0.02 % nebulizer solution  Commonly known as:  ATROVENT  Take 2.5 mLs (0.5 mg total) by nebulization 4 (four) times daily.     pantoprazole 40 MG tablet  Commonly known as:  PROTONIX  Take 1 tablet (40 mg total) by mouth 2 (two) times daily.     predniSONE 10 MG tablet  Commonly known as:  DELTASONE  50 mg bid x 1 day, then 40mg  bid on day 2, then decrease by 10mg  bid until finished.     simvastatin 40 MG tablet  Commonly known as:  ZOCOR  Take 1 tablet (40 mg total) by mouth daily.       No Known Allergies     Follow-up Information   Follow up with Dorris Carnes, MD. (As scheduled in April)    Specialty:  Cardiology   Contact information:   [place] [address] [address] [phone]       Follow up with Asencion Noble, MD In 2 weeks.   Specialty:  Pulmonary Disease   Contact information:   [address] [place] [address] [phone]        The results of significant diagnostics from this hospitalization (including imaging, microbiology, ancillary and laboratory) are listed below for reference.    Significant Diagnostic Studies: Dg Chest 2 View  06/09/2013   CLINICAL DATA:  Shortness of breath.  EXAM: CHEST  2 VIEW  COMPARISON:  06/07/2013 and 03/04/2011 as well as 06/03/2009  FINDINGS: Lungs are adequately inflated and demonstrate mild bronchovascular markings in the bases which are chronic. No definite acute consolidation or effusion. Stable cardiomegaly. Calcified plaque over the aortic arch is present. There are degenerative changes of the spine.  IMPRESSION: No acute cardiopulmonary disease.   Chronic stable changes in the lung bases.  Stable cardiomegaly.   Electronically Signed   By: Marin Olp M.D.   On: 06/09/2013 15:45   Dg Chest Portable 1 View  06/07/2013   CLINICAL DATA:  Shortness of breath.  EXAM: PORTABLE CHEST - 1 VIEW  COMPARISON:  03/04/2011  FINDINGS: Shallow inspiration. Cardiac enlargement with normal pulmonary vascularity. There is infiltration in both lung bases, increasing since the previous study. Pneumonia is not excluded. Linear opacities in the right mid lung probably represents scarring. Calcified and tortuous aorta. No pneumothorax. Old postoperative change versus congenital deformity of left ribs. Degenerative changes in the spine.  IMPRESSION: Shallow inspiration with increasing infiltration in the lung bases. Possible pneumonia. Cardiac enlargement without vascular congestion.   Electronically Signed   By: Lucienne Capers M.D.   On: 06/07/2013 22:29    Microbiology: Recent Results (from the past  240 hour(s))  CULTURE, BLOOD (ROUTINE X 2)     Status: None   Collection Time    06/07/13 11:45 PM      Result Value Range Status   Specimen Description BLOOD LEFT ARM   Final   Special Requests BOTTLES DRAWN AEROBIC AND ANAEROBIC 10CC EA   Final   Culture  Setup Time     Final   Value: 06/08/2013 04:55     Performed at [facility]   Culture     Final   Value:        BLOOD CULTURE RECEIVED NO GROWTH TO DATE CULTURE WILL BE HELD FOR 5 DAYS BEFORE ISSUING A FINAL NEGATIVE REPORT     Performed at [facility]   Report Status PENDING   Incomplete  CULTURE, BLOOD (ROUTINE X 2)     Status: None   Collection Time    06/07/13 11:55 PM      Result Value Range Status   Specimen Description BLOOD RIGHT HAND   Final   Special Requests BOTTLES DRAWN AEROBIC ONLY 8CC   Final   Culture  Setup Time     Final   Value: 06/08/2013 04:54     Performed at [facility]   Culture     Final   Value:        BLOOD CULTURE RECEIVED NO GROWTH TO DATE  CULTURE WILL BE HELD FOR 5 DAYS BEFORE ISSUING A FINAL NEGATIVE REPORT     Performed at [facility]   Report Status PENDING   Incomplete  MRSA PCR SCREENING     Status: None   Collection Time    06/08/13  7:00 AM      Result Value Range Status   MRSA by PCR NEGATIVE  NEGATIVE Final   Comment:            The GeneXpert MRSA Assay (FDA     approved for NASAL specimens     only), is one component of a     comprehensive MRSA colonization     surveillance program. It is not     intended to diagnose MRSA     infection nor to guide or     monitor treatment for     MRSA infections.     Labs: Basic Metabolic Panel:  Recent Labs Lab 06/07/13 2145 06/08/13 0130 06/08/13 0434 06/09/13 0600 06/10/13 0549  NA 145 143 141 146 145  K 4.3 3.6* 3.9 3.7 3.7  CL 104 103 99 104 102  CO2 32  --  29 31 29   GLUCOSE 109* 201* 225* 174* 181*  BUN 10 11 10 13 15   CREATININE 0.78 0.80 0.71 0.68 0.75  CALCIUM 9.1  --  8.7 8.3* 8.1*   Liver Function Tests:  Recent Labs Lab 06/08/13 0434  AST 14  ALT 8  ALKPHOS 66  BILITOT 0.3  PROT 7.3  ALBUMIN 3.3*   No results found for this basename: LIPASE, AMYLASE,  in the last 168 hours No results found for this basename: AMMONIA,  in the last 168 hours CBC:  Recent Labs Lab 06/07/13 2145 06/08/13 0130 06/08/13 0434  WBC 6.7  --  7.3  NEUTROABS 2.7  --  6.9  HGB 15.3* 16.7* 14.8  HCT 46.0 49.0* 45.4  MCV 97.9  --  97.8  PLT 257  --  260   Cardiac Enzymes: No results found for this basename: CKTOTAL, CKMB, CKMBINDEX, TROPONINI,  in the last  168 hours BNP: BNP (last 3 results)  Recent Labs  06/07/13 2145  PROBNP 200.4   CBG:  Recent Labs Lab 06/08/13 0613 06/08/13 0818 06/08/13 1100  GLUCAP 195* 165* 152*       Signed:  Jensyn Shave K  [facility] 06/12/2013, 8:35 PM

## 2013-06-14 ENCOUNTER — Telehealth: Payer: Self-pay | Admitting: Internal Medicine

## 2013-06-14 LAB — CULTURE, BLOOD (ROUTINE X 2)
Culture: NO GROWTH
Culture: NO GROWTH

## 2013-06-14 NOTE — Telephone Encounter (Signed)
New Prob    Pt has a level 1 interaction b/w simvastatin and diltiazem. Please call.

## 2013-06-15 ENCOUNTER — Telehealth: Payer: Self-pay | Admitting: Critical Care Medicine

## 2013-06-15 NOTE — Telephone Encounter (Signed)
Spoke with pt and she is scheduled to come in and see TP on 06/22/13 at 10 AM. Nothing further needed

## 2013-06-15 NOTE — Telephone Encounter (Signed)
Spoke with [name], the pt was changed to simvastatin in the hosp because pravastatin was not going to be covered by her insurance. Pt to stop the simvastatin, they will find out what the insurance will cover and let us know.

## 2013-06-15 NOTE — Telephone Encounter (Signed)
Pt was d/c'd 06/12/13: Follow up with Asencion Noble, MD In 2 weeks.  --  I called spoke with pt. She is scheduled to see Dr. Joya Gaskins 07/02/13 at 45 AM. Dr. Joya Gaskins is not in [place] the week of 06/21/13 and pt does not want to see TP. I advised pt we will let Dr. Joya Gaskins know about her appt. Nothing further needed

## 2013-06-15 NOTE — Telephone Encounter (Signed)
She really needs to see the NP. Advise the pt i recommend this for post hospital care then see me later  It is very important

## 2013-06-18 ENCOUNTER — Telehealth: Payer: Self-pay | Admitting: *Deleted

## 2013-06-18 DIAGNOSIS — E7800 Pure hypercholesterolemia, unspecified: Secondary | ICD-10-CM

## 2013-06-18 NOTE — Telephone Encounter (Signed)
Patient called to see if you knew what she was to supposed to be taking to replace the simvastatin. Please advise. Thanks, MI

## 2013-06-18 NOTE — Telephone Encounter (Signed)
Spoke with pt, she does not know what her insurance will cover. Aware will discuss with dr Harrington Challenger and let the pt know. Pt agreed with this plan.

## 2013-06-22 ENCOUNTER — Ambulatory Visit (INDEPENDENT_AMBULATORY_CARE_PROVIDER_SITE_OTHER)
Admission: RE | Admit: 2013-06-22 | Discharge: 2013-06-22 | Disposition: A | Discharge status: Home / Self Care | Payer: Medicare Other | Source: Ambulatory Visit | Attending: Adult Health | Admitting: Adult Health

## 2013-06-22 ENCOUNTER — Encounter: Payer: Self-pay | Admitting: Adult Health

## 2013-06-22 ENCOUNTER — Ambulatory Visit (INDEPENDENT_AMBULATORY_CARE_PROVIDER_SITE_OTHER): Payer: Medicare Other | Admitting: Adult Health

## 2013-06-22 VITALS — BP 130/76 | HR 85 | Temp 97.4°F | Ht 64.0 in | Wt 189.2 lb

## 2013-06-22 DIAGNOSIS — J441 Chronic obstructive pulmonary disease with (acute) exacerbation: Secondary | ICD-10-CM

## 2013-06-22 DIAGNOSIS — J189 Pneumonia, unspecified organism: Secondary | ICD-10-CM

## 2013-06-22 MED ORDER — ALBUTEROL SULFATE (2.5 MG/3ML) 0.083% IN NEBU: 2.5000 mg | INHALATION_SOLUTION | Freq: Four times a day (QID) | RESPIRATORY_TRACT | Status: DC

## 2013-06-22 MED ORDER — ATORVASTATIN CALCIUM 10 MG PO TABS: 10.0000 mg | ORAL_TABLET | Freq: Every day | ORAL | Status: DC

## 2013-06-22 NOTE — Progress Notes (Signed)
   Subjective:    Patient ID: [name], female    DOB: Jun 12, 1931, 80 y.o.   MRN: [id number]  HPI 80 yo female with known hx of COPD -O2 dependent   06/22/2013 [facility] follow up  Patient returns for a post hospital followup. Patient was admitted December 29 through January 3 for a COPD exacerbation, community-acquired pneumonia , and SVT. Patient was treated with IV antibiotics, steroids, and nebulized bronchodilators. She was discharged on antibiotics, complete a seven-day course and a steroid taper.. SVT, stabilized with heart rate control. This was felt to be secondary to and held beta agonist bronchodilators. Her 2-D echo was unremarkable. Except for grade 1 diastolic dysfunction. Chest x-ray shows stable bilateral, basilar atelectasis. Remains on O2 2l /m rest , and 3 l/m act .  On atrovent and albuterol Four times a day     Review of Systems Constitutional:   No  weight loss, night sweats,  Fevers, chills,  +fatigue, or  lassitude.  HEENT:   No headaches,  Difficulty swallowing,  Tooth/dental problems, or  Sore throat,                No sneezing, itching, ear ache,  +nasal congestion, post nasal drip,   CV:  No chest pain,  Orthopnea, PND, swelling in lower extremities, anasarca, dizziness, palpitations, syncope.   GI  No heartburn, indigestion, abdominal pain, nausea, vomiting, diarrhea, change in bowel habits, loss of appetite, bloody stools.   Resp:  No chest wall deformity  Skin: no rash or lesions.  GU: no dysuria, change in color of urine, no urgency or frequency.  No flank pain, no hematuria   MS:  No joint pain or swelling.  No decreased range of motion.  No back pain.  Psych:  No change in mood or affect. No depression or anxiety.  No memory loss.         Objective:   Physical Exam GEN: A/Ox3; pleasant , NAD, chronically ill appearing , in wheelchair   HEENT:  [place]/AT,  EACs-clear, TMs-wnl, NOSE-clear, THROAT-clear, no lesions, no postnasal drip or  exudate noted.   NECK:  Supple w/ fair ROM; no JVD; normal carotid impulses w/o bruits; no thyromegaly or nodules palpated; no lymphadenopathy.  RESP  Decreased BS in bases , no  accessory muscle use, no dullness to percussion  CARD:  RRR, no m/r/g  , no peripheral edema, pulses intact, no cyanosis or clubbing.  GI:   Soft & nt; nml bowel sounds; no organomegaly or masses detected.  Musco: Warm bil, no deformities or joint swelling noted.   Neuro: alert, no focal deficits noted.    Skin: Warm, no lesions or rashes         Assessment & Plan:

## 2013-06-22 NOTE — Telephone Encounter (Signed)
Spoke with pt, Aware of dr Harrington Challenger recommendation. She will call if tolerating to schedule blood work

## 2013-06-22 NOTE — Telephone Encounter (Signed)
Left message for pt to call, dr Harrington Challenger would like the pt to try lipitor 10 mg once daily. She will need lipid and ast in 8 weeks after changing.

## 2013-06-22 NOTE — Addendum Note (Signed)
Addended by: Parke Poisson E on: 06/22/2013 10:27 AM   Modules accepted: Orders

## 2013-06-22 NOTE — Assessment & Plan Note (Signed)
Resolved on chest x-ray 

## 2013-06-22 NOTE — Patient Instructions (Signed)
Remain on Albuterol and Atrovent Neb Four times a day  .  Continue on Oxygen 2l/m rest and 3l/m with activity  Follow up Dr. Joya Gaskins  In 6-8 weeks and As needed   Please contact office for sooner follow up if symptoms do not improve or worsen or seek emergency care

## 2013-06-22 NOTE — Assessment & Plan Note (Signed)
Reason exacerbation, now resolved.   Plan Remain on Albuterol and Atrovent Neb Four times a day  .  Continue on Oxygen 2l/m rest and 3l/m with activity  Follow up Dr. Joya Gaskins  In 6-8 weeks and As needed   Please contact office for sooner follow up if symptoms do not improve or worsen or seek emergency care

## 2013-06-30 ENCOUNTER — Telehealth: Payer: Self-pay | Admitting: Internal Medicine

## 2013-06-30 NOTE — Telephone Encounter (Signed)
New message           Pt was given lipitor for cholesterol and pt is concerned about damage to her liver because of other meds that she is taking. Pt would like to hear from Dr Harrington Challenger that it is ok to ease her mind.

## 2013-06-30 NOTE — Telephone Encounter (Signed)
Spoke with [name], explained the pt needs to make sure she gets blood work checked in 8 weeks after starting the lipitor so we can monitor the liver. Home health nurse is going to discuss with the pt and if she wants to talk to dr Harrington Challenger she will let us know.

## 2013-07-02 ENCOUNTER — Inpatient Hospital Stay: Payer: Medicare Other | Admitting: Critical Care Medicine

## 2013-09-13 ENCOUNTER — Other Ambulatory Visit: Payer: Self-pay | Admitting: *Deleted

## 2013-09-13 MED ORDER — ALBUTEROL SULFATE HFA 108 (90 BASE) MCG/ACT IN AERS: 2.0000 | INHALATION_SPRAY | Freq: Four times a day (QID) | RESPIRATORY_TRACT | Status: DC | PRN

## 2013-09-13 MED ORDER — ALBUTEROL SULFATE (2.5 MG/3ML) 0.083% IN NEBU: 2.5000 mg | INHALATION_SOLUTION | Freq: Four times a day (QID) | RESPIRATORY_TRACT | Status: DC

## 2013-09-13 MED ORDER — IPRATROPIUM BROMIDE 0.02 % IN SOLN: 0.5000 mg | Freq: Four times a day (QID) | RESPIRATORY_TRACT | Status: DC

## 2013-09-13 NOTE — Telephone Encounter (Signed)
Ms Batdorf is calling to see if Dr Harrington Challenger will refill her albuterol inhaler. She states Dr Harrington Challenger name is on the box of her last refill. In the system we have Dr Bettina Gavia office and hospital Dr Maryland Pink as t he last to refill her albuterol medications. I  Let her know I will send a message to Dr Harrington Challenger nurse about this refill. I also asked her to call Dr Bettina Gavia office because they be able to refill this rx faster since she sees pulmonary.  I will forward to Dr Harrington Challenger nurse and Dr Joya Gaskins.

## 2014-01-13 ENCOUNTER — Telehealth: Payer: Self-pay | Admitting: Critical Care Medicine

## 2014-01-13 MED ORDER — ALBUTEROL SULFATE HFA 108 (90 BASE) MCG/ACT IN AERS: 2.0000 | INHALATION_SPRAY | Freq: Four times a day (QID) | RESPIRATORY_TRACT | Status: DC | PRN

## 2014-01-13 NOTE — Telephone Encounter (Signed)
rx has been sent in to the pharmacy and i have called the pt and she is aware. Nothing further is needed.

## 2014-03-31 ENCOUNTER — Emergency Department (HOSPITAL_COMMUNITY): Payer: Medicare Other

## 2014-03-31 ENCOUNTER — Emergency Department (HOSPITAL_COMMUNITY)
Admission: EM | Admit: 2014-03-31 | Discharge: 2014-03-31 | Disposition: A | Discharge status: Home / Self Care | Payer: Medicare Other | Attending: Emergency Medicine | Admitting: Emergency Medicine

## 2014-03-31 ENCOUNTER — Encounter (HOSPITAL_COMMUNITY): Payer: Self-pay | Admitting: Emergency Medicine

## 2014-03-31 DIAGNOSIS — M1990 Unspecified osteoarthritis, unspecified site: Secondary | ICD-10-CM | POA: Diagnosis not present

## 2014-03-31 DIAGNOSIS — R109 Unspecified abdominal pain: Secondary | ICD-10-CM | POA: Insufficient documentation

## 2014-03-31 DIAGNOSIS — Z87891 Personal history of nicotine dependence: Secondary | ICD-10-CM | POA: Diagnosis not present

## 2014-03-31 DIAGNOSIS — J449 Chronic obstructive pulmonary disease, unspecified: Secondary | ICD-10-CM | POA: Insufficient documentation

## 2014-03-31 DIAGNOSIS — Z8639 Personal history of other endocrine, nutritional and metabolic disease: Secondary | ICD-10-CM | POA: Insufficient documentation

## 2014-03-31 DIAGNOSIS — Z87828 Personal history of other (healed) physical injury and trauma: Secondary | ICD-10-CM | POA: Insufficient documentation

## 2014-03-31 DIAGNOSIS — I10 Essential (primary) hypertension: Secondary | ICD-10-CM | POA: Insufficient documentation

## 2014-03-31 DIAGNOSIS — Z87442 Personal history of urinary calculi: Secondary | ICD-10-CM | POA: Diagnosis not present

## 2014-03-31 DIAGNOSIS — Z79899 Other long term (current) drug therapy: Secondary | ICD-10-CM | POA: Insufficient documentation

## 2014-03-31 DIAGNOSIS — Z7982 Long term (current) use of aspirin: Secondary | ICD-10-CM | POA: Insufficient documentation

## 2014-03-31 DIAGNOSIS — Z8619 Personal history of other infectious and parasitic diseases: Secondary | ICD-10-CM | POA: Diagnosis not present

## 2014-03-31 DIAGNOSIS — R079 Chest pain, unspecified: Secondary | ICD-10-CM | POA: Insufficient documentation

## 2014-03-31 DIAGNOSIS — IMO0001 Reserved for inherently not codable concepts without codable children: Secondary | ICD-10-CM

## 2014-03-31 LAB — COMPREHENSIVE METABOLIC PANEL
ALK PHOS: 74 U/L (ref 39–117)
ALT: 12 U/L (ref 0–35)
AST: 16 U/L (ref 0–37)
Albumin: 3.8 g/dL (ref 3.5–5.2)
Anion gap: 11 (ref 5–15)
BILIRUBIN TOTAL: 0.9 mg/dL (ref 0.3–1.2)
BUN: 13 mg/dL (ref 6–23)
CHLORIDE: 102 meq/L (ref 96–112)
CO2: 29 meq/L (ref 19–32)
Calcium: 9.1 mg/dL (ref 8.4–10.5)
Creatinine, Ser: 0.73 mg/dL (ref 0.50–1.10)
GFR calc non Af Amer: 79 mL/min — ABNORMAL LOW (ref >90)
GLUCOSE: 121 mg/dL — AB (ref 70–99)
POTASSIUM: 3.6 meq/L — AB (ref 3.7–5.3)
SODIUM: 142 meq/L (ref 137–147)
TOTAL PROTEIN: 7.5 g/dL (ref 6.0–8.3)

## 2014-03-31 LAB — URINALYSIS, ROUTINE W REFLEX MICROSCOPIC
Glucose, UA: NEGATIVE mg/dL
HGB URINE DIPSTICK: NEGATIVE
Ketones, ur: NEGATIVE mg/dL
Leukocytes, UA: NEGATIVE
Nitrite: NEGATIVE
PROTEIN: NEGATIVE mg/dL
Specific Gravity, Urine: 1.03 (ref 1.005–1.030)
Urobilinogen, UA: 1 mg/dL (ref 0.0–1.0)
pH: 5.5 (ref 5.0–8.0)

## 2014-03-31 LAB — CBC WITH DIFFERENTIAL/PLATELET
Basophils Absolute: 0 10*3/uL (ref 0.0–0.1)
Basophils Relative: 0 % (ref 0–1)
EOS ABS: 0.1 10*3/uL (ref 0.0–0.7)
Eosinophils Relative: 1 % (ref 0–5)
HCT: 47.5 % — ABNORMAL HIGH (ref 36.0–46.0)
HEMOGLOBIN: 15.5 g/dL — AB (ref 12.0–15.0)
LYMPHS ABS: 1.7 10*3/uL (ref 0.7–4.0)
LYMPHS PCT: 24 % (ref 12–46)
MCH: 31.4 pg (ref 26.0–34.0)
MCHC: 32.6 g/dL (ref 30.0–36.0)
MCV: 96.3 fL (ref 78.0–100.0)
MONOS PCT: 7 % (ref 3–12)
Monocytes Absolute: 0.5 10*3/uL (ref 0.1–1.0)
NEUTROS ABS: 4.7 10*3/uL (ref 1.7–7.7)
NEUTROS PCT: 68 % (ref 43–77)
PLATELETS: 273 10*3/uL (ref 150–400)
RBC: 4.93 MIL/uL (ref 3.87–5.11)
RDW: 13.1 % (ref 11.5–15.5)
WBC: 7 10*3/uL (ref 4.0–10.5)

## 2014-03-31 LAB — TROPONIN I: Troponin I: <0.3 ng/mL (ref <0.30)

## 2014-03-31 MED ORDER — HYDROCODONE-ACETAMINOPHEN 5-325 MG PO TABS: 1.0000 | ORAL_TABLET | Freq: Four times a day (QID) | ORAL | Status: DC | PRN

## 2014-03-31 MED ORDER — MORPHINE SULFATE 4 MG/ML IJ SOLN
4.0000 mg | Freq: Once | INTRAMUSCULAR | Status: AC
  Administered 2014-03-31: 4 mg via INTRAVENOUS
  Filled 2014-03-31: qty 1

## 2014-03-31 MED ORDER — ONDANSETRON 4 MG PO TBDP
4.0000 mg | ORAL_TABLET | Freq: Once | ORAL | Status: AC
  Administered 2014-03-31: 4 mg via ORAL
  Filled 2014-03-31: qty 1

## 2014-03-31 MED ORDER — ONDANSETRON HCL 4 MG/2ML IJ SOLN
4.0000 mg | Freq: Once | INTRAMUSCULAR | Status: AC
  Administered 2014-03-31: 4 mg via INTRAVENOUS
  Filled 2014-03-31: qty 2

## 2014-03-31 MED ORDER — IBUPROFEN 400 MG PO TABS: 400.0000 mg | ORAL_TABLET | Freq: Four times a day (QID) | ORAL | Status: DC | PRN
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
## 2014-03-31 NOTE — Discharge Instructions (Signed)
We saw you in the ER for the abdominal pain, flank pain. All the results in the ER are normal, labs and imaging. We are not sure what is causing your symptoms. The workup in the ER is not complete, and is limited to screening for life threatening and emergent conditions only, so please see a primary care doctor for further evaluation.  Please return to the ER if your symptoms worsen; you have increased pain, fevers, chills, inability to keep any medications down, confusion. Otherwise see the outpatient doctor as requested.   Flank Pain Flank pain refers to pain that is located on the side of the body between the upper abdomen and the back. The pain may occur over a short period of time (acute) or may be long-term or reoccurring (chronic). It may be mild or severe. Flank pain can be caused by many things. CAUSES  Some of the more common causes of flank pain include:  Muscle strains.   Muscle spasms.   A disease of your spine (vertebral disk disease).   A lung infection (pneumonia).   Fluid around your lungs (pulmonary edema).   A kidney infection.   Kidney stones.   A very painful skin rash caused by the chickenpox virus (shingles).   Gallbladder disease.  [place] care will depend on the cause of your pain. In general,  Rest as directed by your caregiver.  Drink enough fluids to keep your urine clear or pale yellow.  Only take over-the-counter or prescription medicines as directed by your caregiver. Some medicines may help relieve the pain.  Tell your caregiver about any changes in your pain.  Follow up with your caregiver as directed. SEEK IMMEDIATE MEDICAL CARE IF:   Your pain is not controlled with medicine.   You have new or worsening symptoms.  Your pain increases.   You have abdominal pain.   You have shortness of breath.   You have persistent nausea or vomiting.   You have swelling in your abdomen.   You feel faint or  pass out.   You have blood in your urine.  You have a fever or persistent symptoms for more than 2-3 days.  You have a fever and your symptoms suddenly get worse. MAKE SURE YOU:   Understand these instructions.  Will watch your condition.  Will get help right away if you are not doing well or get worse. Document Released: 07/18/2005 Document Revised: 02/19/2012 Document Reviewed: 01/09/2012 [facility] Patient Information 2015 [place], [place]. This information is not intended to replace advice given to you by your health care provider. Make sure you discuss any questions you have with your health care provider.

## 2014-03-31 NOTE — ED Notes (Signed)
Pt c/o L rib area radiating to L abd, pt is on 3L  cont

## 2014-03-31 NOTE — ED Notes (Signed)
Patient transported to X-ray 

## 2014-04-14 ENCOUNTER — Other Ambulatory Visit: Payer: Self-pay | Admitting: Internal Medicine

## 2014-07-04 ENCOUNTER — Ambulatory Visit: Payer: Medicare Other | Admitting: Critical Care Medicine
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
## 2014-07-14 ENCOUNTER — Encounter: Payer: Self-pay | Admitting: Internal Medicine

## 2014-07-14 ENCOUNTER — Ambulatory Visit (INDEPENDENT_AMBULATORY_CARE_PROVIDER_SITE_OTHER): Payer: Medicare Other | Admitting: Internal Medicine

## 2014-07-14 VITALS — BP 150/88 | HR 80 | Ht 64.0 in | Wt 187.8 lb

## 2014-07-14 DIAGNOSIS — E785 Hyperlipidemia, unspecified: Secondary | ICD-10-CM

## 2014-07-14 DIAGNOSIS — R0600 Dyspnea, unspecified: Secondary | ICD-10-CM

## 2014-07-14 DIAGNOSIS — R7302 Impaired glucose tolerance (oral): Secondary | ICD-10-CM

## 2014-07-14 LAB — LIPID PANEL
CHOL/HDL RATIO: 4
Cholesterol: 238 mg/dL — ABNORMAL HIGH (ref 0–200)
HDL: 59.9 mg/dL (ref >39.00)
LDL CALC: 152 mg/dL — AB (ref 0–99)
NonHDL: 178.1
TRIGLYCERIDES: 133 mg/dL (ref 0.0–149.0)
VLDL: 26.6 mg/dL (ref 0.0–40.0)

## 2014-07-14 LAB — CBC WITH DIFFERENTIAL/PLATELET
BASOS PCT: 0.4 % (ref 0.0–3.0)
Basophils Absolute: 0 10*3/uL (ref 0.0–0.1)
Eosinophils Absolute: 0.2 10*3/uL (ref 0.0–0.7)
Eosinophils Relative: 3.2 % (ref 0.0–5.0)
HCT: 44 % (ref 36.0–46.0)
Hemoglobin: 14.5 g/dL (ref 12.0–15.0)
Lymphocytes Relative: 36.6 % (ref 12.0–46.0)
Lymphs Abs: 2.1 10*3/uL (ref 0.7–4.0)
MCHC: 33 g/dL (ref 30.0–36.0)
MCV: 92.3 fl (ref 78.0–100.0)
Monocytes Absolute: 0.5 10*3/uL (ref 0.1–1.0)
Monocytes Relative: 9.1 % (ref 3.0–12.0)
NEUTROS ABS: 2.9 10*3/uL (ref 1.4–7.7)
Neutrophils Relative %: 50.7 % (ref 43.0–77.0)
Platelets: 237 10*3/uL (ref 150.0–400.0)
RBC: 4.77 Mil/uL (ref 3.87–5.11)
RDW: 14.1 % (ref 11.5–15.5)
WBC: 5.7 10*3/uL (ref 4.0–10.5)

## 2014-07-14 LAB — BASIC METABOLIC PANEL
BUN: 10 mg/dL (ref 6–23)
CHLORIDE: 103 meq/L (ref 96–112)
CO2: 32 meq/L (ref 19–32)
Calcium: 9 mg/dL (ref 8.4–10.5)
Creatinine, Ser: 0.75 mg/dL (ref 0.40–1.20)
GFR: 95.4 mL/min (ref >60.00)
GLUCOSE: 90 mg/dL (ref 70–99)
Potassium: 3.6 mEq/L (ref 3.5–5.1)
SODIUM: 141 meq/L (ref 135–145)

## 2014-07-14 LAB — HEMOGLOBIN A1C: Hgb A1c MFr Bld: 5.7 % (ref 4.6–6.5)

## 2014-07-14 NOTE — Patient Instructions (Signed)
Your physician has recommended you make the following change in your medication:  1) Increase cardizem to 60 mg one whole tablet by mouth twice daily  Your physician recommends that you have lab work today: BMP/ CBC/ [place] wants you to follow-up in: December 2016 with Dr. Harrington Challenger. You will receive a reminder letter in the mail two months in advance. If you don't receive a letter, please call our office to schedule the follow-up appointment.

## 2014-07-18 ENCOUNTER — Ambulatory Visit (INDEPENDENT_AMBULATORY_CARE_PROVIDER_SITE_OTHER): Payer: Medicare Other | Admitting: Critical Care Medicine

## 2014-07-18 ENCOUNTER — Encounter: Payer: Self-pay | Admitting: Critical Care Medicine

## 2014-07-18 ENCOUNTER — Telehealth: Payer: Self-pay | Admitting: Internal Medicine

## 2014-07-18 VITALS — BP 152/94 | HR 71 | Temp 98.1°F | Ht 65.0 in | Wt 186.8 lb

## 2014-07-18 DIAGNOSIS — J9611 Chronic respiratory failure with hypoxia: Secondary | ICD-10-CM

## 2014-07-18 DIAGNOSIS — J441 Chronic obstructive pulmonary disease with (acute) exacerbation: Secondary | ICD-10-CM

## 2014-07-18 MED ORDER — ALBUTEROL SULFATE HFA 108 (90 BASE) MCG/ACT IN AERS: 2.0000 | INHALATION_SPRAY | Freq: Four times a day (QID) | RESPIRATORY_TRACT | Status: DC | PRN

## 2014-07-18 MED ORDER — PANTOPRAZOLE SODIUM 40 MG PO TBEC: 40.0000 mg | DELAYED_RELEASE_TABLET | Freq: Every day | ORAL | Status: DC

## 2014-07-18 MED ORDER — ALBUTEROL SULFATE (2.5 MG/3ML) 0.083% IN NEBU: 2.5000 mg | INHALATION_SOLUTION | Freq: Four times a day (QID) | RESPIRATORY_TRACT | Status: DC

## 2014-07-18 MED ORDER — METHYLPREDNISOLONE ACETATE 80 MG/ML IJ SUSP
120.0000 mg | Freq: Once | INTRAMUSCULAR | Status: AC
  Administered 2014-07-18: 120 mg via INTRAMUSCULAR

## 2014-07-18 NOTE — Assessment & Plan Note (Signed)
Copd with exacerbation Plan Change oxygen to 3liters rest 4Liter exertion A depomedrol 120mg  injection was given No change in nebulizer medications, refills sent Reduce pantoprazole to one daily 1/2 hour before breakfast  Return 6 months

## 2014-07-18 NOTE — Progress Notes (Signed)
Subjective:    Patient ID: [name], female    DOB: 06-03-1932, 81 y.o.   MRN: [id number]  HPI 81 yo female with known hx of COPD -O2 dependent   07/18/2014 Chief Complaint  Patient presents with  . Follow-up    o2 sat 84% on 3lpm pulsed upon arrival to exam room.  increased SOB with minimal activity, PND, and clearing throat with thick, clear mucus.  No wheezing, chest tightness, or CP.    Pt in ED 03/2014 for copd flare, no visits since. Now is more dyspneic and clearing throat a lot.  Pt with more dyspnea and cough.  Clear mucus is coming up.  No real chest pain is noted.  No real edema in feet.   Pt denies any significant sore throat, nasal congestion or excess secretions, fever, chills, sweats, unintended weight loss, pleurtic or exertional chest pain, orthopnea PND, or leg swelling Pt denies any increase in rescue therapy over baseline, denies waking up needing it or having any early am or nocturnal exacerbations of coughing/wheezing/or dyspnea. Pt also denies any obvious fluctuation in symptoms with  weather or environmental change or other alleviating or aggravating factors  Review of Systems Constitutional:   No  weight loss, night sweats,  Fevers, chills,  +fatigue, or  lassitude.  HEENT:   No headaches,  Difficulty swallowing,  Tooth/dental problems, or  Sore throat,                No sneezing, itching, ear ache,  +nasal congestion, post nasal drip,   CV:  No chest pain,  Orthopnea, PND, swelling in lower extremities, anasarca, dizziness, palpitations, syncope.   GI  No heartburn, indigestion, abdominal pain, nausea, vomiting, diarrhea, change in bowel habits, loss of appetite, bloody stools.   Resp:  No chest wall deformity  Skin: no rash or lesions.  GU: no dysuria, change in color of urine, no urgency or frequency.  No flank pain, no hematuria   MS:  No joint pain or swelling.  No decreased range of motion.  No back pain.  Psych:  No change in mood or affect.  No depression or anxiety.  No memory loss.     Objective:   Physical Exam BP 152/94 mmHg  Pulse 71  Temp(Src) 98.1 F (36.7 C) (Oral)  Ht 5\' 5"  (1.651 m)  Wt 186 lb 12.8 oz (84.732 kg)  BMI 31.09 kg/m2  SpO2 92%  GEN: A/Ox3; pleasant , NAD, chronically ill appearing , in wheelchair   HEENT:  [place]/AT,  EACs-clear, TMs-wnl, NOSE-clear, THROAT-clear, no lesions, no postnasal drip or exudate noted.   NECK:  Supple w/ fair ROM; no JVD; normal carotid impulses w/o bruits; no thyromegaly or nodules palpated; no lymphadenopathy.  RESP  Decreased BS in bases , no  accessory muscle use, no dullness to percussion  CARD:  RRR, no m/r/g  , no peripheral edema, pulses intact, no cyanosis or clubbing.  GI:   Soft & nt; nml bowel sounds; no organomegaly or masses detected.  Musco: Warm bil, no deformities or joint swelling noted.   Neuro: alert, no focal deficits noted.    Skin: Warm, no lesions or rashes         Assessment & Plan:   COPD exacerbation Copd with exacerbation Plan Change oxygen to 3liters rest 4Liter exertion A depomedrol 120mg  injection was given No change in nebulizer medications, refills sent Reduce pantoprazole to one daily 1/2 hour before breakfast  Return 6 months  Updated Medication List Outpatient Encounter Prescriptions as of 07/18/2014  Medication Sig  . albuterol (PROAIR HFA) 108 (90 BASE) MCG/ACT inhaler Inhale 2 puffs into the lungs every 6 (six) hours as needed for wheezing or shortness of breath.  Marland Kitchen albuterol (PROVENTIL) (2.5 MG/3ML) 0.083% nebulizer solution Take 3 mLs (2.5 mg total) by nebulization 4 (four) times daily.  Marland Kitchen aspirin EC 81 MG tablet Take 81 mg by mouth daily as needed.   . Aspirin-Salicylamide-Caffeine (BC HEADACHE POWDER PO) Take 0.5-1 packets by mouth 2 (two) times daily as needed (for pain).   Marland Kitchen diltiazem (CARDIZEM) 60 MG tablet Take one tablet by mouth twice daily  . pantoprazole (PROTONIX) 40 MG tablet Take 1 tablet (40 mg  total) by mouth daily.  . [DISCONTINUED] albuterol (PROAIR HFA) 108 (90 BASE) MCG/ACT inhaler Inhale 2 puffs into the lungs every 6 (six) hours as needed for wheezing or shortness of breath.  . [DISCONTINUED] albuterol (PROVENTIL) (2.5 MG/3ML) 0.083% nebulizer solution Take 3 mLs (2.5 mg total) by nebulization 4 (four) times daily.  . [DISCONTINUED] pantoprazole (PROTONIX) 40 MG tablet Take 1 tablet (40 mg total) by mouth 2 (two) times daily.  . [EXPIRED] methylPREDNISolone acetate (DEPO-MEDROL) injection 120 mg

## 2014-07-18 NOTE — Patient Instructions (Signed)
Change oxygen to 3liters rest 4Liter exertion A depomedrol 120mg  injection was given No change in nebulizer medications, refills sent Reduce pantoprazole to one daily 1/2 hour before breakfast  Return 6 months

## 2014-07-18 NOTE — Telephone Encounter (Signed)
New message  ° ° °Patient calling back to speak with nurse  °

## 2014-07-19 NOTE — Telephone Encounter (Signed)
Late entry from 07/18/14: Called patient back. Discussed lab results. Dr. Harrington Challenger rec. Lipitor Patient is reluctant to take Lipitor because she has heard it causes diabetes. Reviewed Hg A1c as well as glucose in recent labs, which are normal.  She would like Dr. Harrington Challenger to recommend a different medication for her LDL if possible. Staff message sent to Dr. Harrington Challenger.

## 2014-07-28 ENCOUNTER — Telehealth: Payer: Self-pay | Admitting: Internal Medicine

## 2014-07-28 DIAGNOSIS — E785 Hyperlipidemia, unspecified: Secondary | ICD-10-CM

## 2014-07-28 MED ORDER — EZETIMIBE 10 MG PO TABS: 10.0000 mg | ORAL_TABLET | Freq: Every day | ORAL | Status: DC

## 2014-07-28 NOTE — Telephone Encounter (Signed)
Called patient back about changes from labs. Ordering Zetia 10 mg daily by mouth and having labs done on 09/28/14. Patient verbalized understanding. Patient verbalized understanding.

## 2014-07-28 NOTE — Telephone Encounter (Signed)
New message    Patient calling stating someone called her today returning call back

## 2014-09-13 ENCOUNTER — Telehealth: Payer: Self-pay | Admitting: Critical Care Medicine

## 2014-09-13 DIAGNOSIS — J9611 Chronic respiratory failure with hypoxia: Secondary | ICD-10-CM

## 2014-09-13 NOTE — Telephone Encounter (Signed)
Called x 2 line was busy

## 2014-09-14 NOTE — Telephone Encounter (Signed)
LMTCB

## 2014-09-14 NOTE — Telephone Encounter (Signed)
Spoke with the pt She states that her o2 tank is too heavy for her to carry  She is wondering if they have anything lighter  Will send order to [facility] to see if [facility] can see about getting a lighter tank for her

## 2014-09-28 ENCOUNTER — Other Ambulatory Visit (INDEPENDENT_AMBULATORY_CARE_PROVIDER_SITE_OTHER): Payer: Medicare Other | Admitting: *Deleted

## 2014-09-28 DIAGNOSIS — E785 Hyperlipidemia, unspecified: Secondary | ICD-10-CM | POA: Diagnosis not present

## 2014-09-28 LAB — AST: AST: 16 U/L (ref 0–37)

## 2014-09-28 LAB — LIPID PANEL
Cholesterol: 205 mg/dL — ABNORMAL HIGH (ref 0–200)
HDL: 61.4 mg/dL (ref >39.00)
LDL Cholesterol: 125 mg/dL — ABNORMAL HIGH (ref 0–99)
NonHDL: 143.6
Total CHOL/HDL Ratio: 3
Triglycerides: 91 mg/dL (ref 0.0–149.0)
VLDL: 18.2 mg/dL (ref 0.0–40.0)

## 2014-11-01 ENCOUNTER — Other Ambulatory Visit: Payer: Self-pay | Admitting: Internal Medicine

## 2014-11-01 MED ORDER — DILTIAZEM HCL 60 MG PO TABS: ORAL_TABLET | ORAL | Status: DC

## 2014-11-14 ENCOUNTER — Emergency Department (HOSPITAL_COMMUNITY): Payer: Medicare Other

## 2014-11-14 ENCOUNTER — Encounter (HOSPITAL_COMMUNITY): Payer: Self-pay | Admitting: Emergency Medicine

## 2014-11-14 ENCOUNTER — Inpatient Hospital Stay (HOSPITAL_COMMUNITY)
Admission: EM | Admit: 2014-11-14 | Discharge: 2014-11-17 | DRG: 190 | Disposition: A | Discharge status: Home / Self Care | Payer: Medicare Other | Attending: Internal Medicine | Admitting: Internal Medicine

## 2014-11-14 DIAGNOSIS — Z9981 Dependence on supplemental oxygen: Secondary | ICD-10-CM | POA: Diagnosis not present

## 2014-11-14 DIAGNOSIS — N390 Urinary tract infection, site not specified: Secondary | ICD-10-CM

## 2014-11-14 DIAGNOSIS — J441 Chronic obstructive pulmonary disease with (acute) exacerbation: Principal | ICD-10-CM | POA: Diagnosis present

## 2014-11-14 DIAGNOSIS — Z79899 Other long term (current) drug therapy: Secondary | ICD-10-CM

## 2014-11-14 DIAGNOSIS — M1990 Unspecified osteoarthritis, unspecified site: Secondary | ICD-10-CM | POA: Diagnosis present

## 2014-11-14 DIAGNOSIS — J9620 Acute and chronic respiratory failure, unspecified whether with hypoxia or hypercapnia: Secondary | ICD-10-CM | POA: Diagnosis present

## 2014-11-14 DIAGNOSIS — M545 Low back pain: Secondary | ICD-10-CM

## 2014-11-14 DIAGNOSIS — Z7982 Long term (current) use of aspirin: Secondary | ICD-10-CM | POA: Diagnosis not present

## 2014-11-14 DIAGNOSIS — Z87891 Personal history of nicotine dependence: Secondary | ICD-10-CM

## 2014-11-14 DIAGNOSIS — K219 Gastro-esophageal reflux disease without esophagitis: Secondary | ICD-10-CM | POA: Diagnosis present

## 2014-11-14 DIAGNOSIS — R350 Frequency of micturition: Secondary | ICD-10-CM

## 2014-11-14 DIAGNOSIS — J9621 Acute and chronic respiratory failure with hypoxia: Secondary | ICD-10-CM | POA: Diagnosis present

## 2014-11-14 DIAGNOSIS — N3281 Overactive bladder: Secondary | ICD-10-CM | POA: Diagnosis present

## 2014-11-14 DIAGNOSIS — E785 Hyperlipidemia, unspecified: Secondary | ICD-10-CM | POA: Diagnosis present

## 2014-11-14 DIAGNOSIS — M549 Dorsalgia, unspecified: Secondary | ICD-10-CM

## 2014-11-14 DIAGNOSIS — I10 Essential (primary) hypertension: Secondary | ICD-10-CM | POA: Diagnosis present

## 2014-11-14 DIAGNOSIS — J45909 Unspecified asthma, uncomplicated: Secondary | ICD-10-CM | POA: Diagnosis present

## 2014-11-14 DIAGNOSIS — R0602 Shortness of breath: Secondary | ICD-10-CM | POA: Diagnosis present

## 2014-11-14 LAB — CBC WITH DIFFERENTIAL/PLATELET
BASOS PCT: 0 % (ref 0–1)
Basophils Absolute: 0 10*3/uL (ref 0.0–0.1)
Eosinophils Absolute: 0.1 10*3/uL (ref 0.0–0.7)
Eosinophils Relative: 2 % (ref 0–5)
HEMATOCRIT: 42 % (ref 36.0–46.0)
Hemoglobin: 13.3 g/dL (ref 12.0–15.0)
Lymphocytes Relative: 32 % (ref 12–46)
Lymphs Abs: 2.2 10*3/uL (ref 0.7–4.0)
MCH: 30.9 pg (ref 26.0–34.0)
MCHC: 31.7 g/dL (ref 30.0–36.0)
MCV: 97.7 fL (ref 78.0–100.0)
MONO ABS: 0.6 10*3/uL (ref 0.1–1.0)
MONOS PCT: 8 % (ref 3–12)
Neutro Abs: 3.9 10*3/uL (ref 1.7–7.7)
Neutrophils Relative %: 58 % (ref 43–77)
Platelets: 259 10*3/uL (ref 150–400)
RBC: 4.3 MIL/uL (ref 3.87–5.11)
RDW: 13 % (ref 11.5–15.5)
WBC: 6.9 10*3/uL (ref 4.0–10.5)

## 2014-11-14 LAB — TROPONIN I: Troponin I: <0.03 ng/mL (ref <0.031)

## 2014-11-14 LAB — URINALYSIS, ROUTINE W REFLEX MICROSCOPIC
BILIRUBIN URINE: NEGATIVE
Glucose, UA: NEGATIVE mg/dL
HGB URINE DIPSTICK: NEGATIVE
Ketones, ur: NEGATIVE mg/dL
LEUKOCYTES UA: NEGATIVE
Nitrite: NEGATIVE
PH: 5.5 (ref 5.0–8.0)
Protein, ur: NEGATIVE mg/dL
Specific Gravity, Urine: 1.004 — ABNORMAL LOW (ref 1.005–1.030)
UROBILINOGEN UA: 1 mg/dL (ref 0.0–1.0)

## 2014-11-14 LAB — COMPREHENSIVE METABOLIC PANEL
ALBUMIN: 3.4 g/dL — AB (ref 3.5–5.0)
ALK PHOS: 71 U/L (ref 38–126)
ALT: 17 U/L (ref 14–54)
ANION GAP: 9 (ref 5–15)
AST: 20 U/L (ref 15–41)
BUN: 16 mg/dL (ref 6–20)
CALCIUM: 8.7 mg/dL — AB (ref 8.9–10.3)
CHLORIDE: 104 mmol/L (ref 101–111)
CO2: 28 mmol/L (ref 22–32)
Creatinine, Ser: 0.78 mg/dL (ref 0.44–1.00)
Glucose, Bld: 126 mg/dL — ABNORMAL HIGH (ref 65–99)
Potassium: 4.2 mmol/L (ref 3.5–5.1)
Sodium: 141 mmol/L (ref 135–145)
TOTAL PROTEIN: 6.8 g/dL (ref 6.5–8.1)
Total Bilirubin: 0.5 mg/dL (ref 0.3–1.2)

## 2014-11-14 LAB — BRAIN NATRIURETIC PEPTIDE: B Natriuretic Peptide: 76.1 pg/mL (ref 0.0–100.0)

## 2014-11-14 MED ORDER — ACETAMINOPHEN 325 MG PO TABS: 650.0000 mg | ORAL_TABLET | Freq: Four times a day (QID) | ORAL | Status: DC | PRN

## 2014-11-14 MED ORDER — OXYBUTYNIN CHLORIDE 5 MG PO TABS
5.0000 mg | ORAL_TABLET | Freq: Three times a day (TID) | ORAL | Status: DC | PRN
  Filled 2014-11-14: qty 1

## 2014-11-14 MED ORDER — IPRATROPIUM-ALBUTEROL 0.5-2.5 (3) MG/3ML IN SOLN
3.0000 mL | RESPIRATORY_TRACT | Status: DC
  Administered 2014-11-15: 3 mL via RESPIRATORY_TRACT
  Filled 2014-11-14: qty 3

## 2014-11-14 MED ORDER — ACETAMINOPHEN 650 MG RE SUPP: 650.0000 mg | Freq: Four times a day (QID) | RECTAL | Status: DC | PRN

## 2014-11-14 MED ORDER — SODIUM CHLORIDE 0.9 % IV SOLN
INTRAVENOUS | Status: DC
  Administered 2014-11-15: 10:00:00 via INTRAVENOUS
  Administered 2014-11-15: 100 mL/h via INTRAVENOUS
  Administered 2014-11-15: 1000 mL via INTRAVENOUS
  Administered 2014-11-16: 100 mL/h via INTRAVENOUS
  Administered 2014-11-16: 10:00:00 via INTRAVENOUS

## 2014-11-14 MED ORDER — PHENAZOPYRIDINE HCL 100 MG PO TABS
100.0000 mg | ORAL_TABLET | Freq: Three times a day (TID) | ORAL | Status: DC
  Administered 2014-11-14 – 2014-11-17 (×8): 100 mg via ORAL
  Filled 2014-11-14 (×11): qty 1

## 2014-11-14 MED ORDER — IBUPROFEN 200 MG PO TABS: 600.0000 mg | ORAL_TABLET | Freq: Four times a day (QID) | ORAL | Status: DC | PRN

## 2014-11-14 MED ORDER — ALBUTEROL SULFATE (2.5 MG/3ML) 0.083% IN NEBU
5.0000 mg | INHALATION_SOLUTION | Freq: Once | RESPIRATORY_TRACT | Status: AC
  Administered 2014-11-14: 5 mg via RESPIRATORY_TRACT
  Filled 2014-11-14: qty 6

## 2014-11-14 MED ORDER — ONDANSETRON HCL 4 MG/2ML IJ SOLN
4.0000 mg | Freq: Once | INTRAMUSCULAR | Status: AC
  Administered 2014-11-14: 4 mg via INTRAVENOUS
  Filled 2014-11-14: qty 2

## 2014-11-14 MED ORDER — DOXYCYCLINE HYCLATE 100 MG PO TABS
100.0000 mg | ORAL_TABLET | Freq: Two times a day (BID) | ORAL | Status: DC
  Administered 2014-11-15 – 2014-11-17 (×6): 100 mg via ORAL
  Filled 2014-11-14 (×7): qty 1

## 2014-11-14 MED ORDER — SODIUM CHLORIDE 0.9 % IV SOLN: INTRAVENOUS | Status: AC

## 2014-11-14 MED ORDER — OXYCODONE HCL 5 MG PO TABS
5.0000 mg | ORAL_TABLET | ORAL | Status: DC | PRN
  Administered 2014-11-15 – 2014-11-17 (×3): 5 mg via ORAL
  Filled 2014-11-14 (×3): qty 1

## 2014-11-14 MED ORDER — MORPHINE SULFATE 4 MG/ML IJ SOLN
4.0000 mg | Freq: Once | INTRAMUSCULAR | Status: AC
  Administered 2014-11-14: 4 mg via INTRAVENOUS
  Filled 2014-11-14: qty 1

## 2014-11-14 MED ORDER — PANTOPRAZOLE SODIUM 40 MG PO TBEC
40.0000 mg | DELAYED_RELEASE_TABLET | Freq: Every day | ORAL | Status: DC
  Administered 2014-11-15 – 2014-11-17 (×4): 40 mg via ORAL
  Filled 2014-11-14 (×4): qty 1

## 2014-11-14 MED ORDER — DILTIAZEM HCL 60 MG PO TABS
60.0000 mg | ORAL_TABLET | Freq: Two times a day (BID) | ORAL | Status: DC
  Administered 2014-11-15 – 2014-11-17 (×6): 60 mg via ORAL
  Filled 2014-11-14 (×7): qty 1

## 2014-11-14 MED ORDER — METHYLPREDNISOLONE SODIUM SUCC 125 MG IJ SOLR
60.0000 mg | Freq: Four times a day (QID) | INTRAMUSCULAR | Status: DC
  Administered 2014-11-15 (×3): 60 mg via INTRAVENOUS
  Filled 2014-11-14 (×6): qty 0.96

## 2014-11-14 MED ORDER — LEVOFLOXACIN IN D5W 500 MG/100ML IV SOLN
500.0000 mg | Freq: Once | INTRAVENOUS | Status: DC
  Administered 2014-11-14: 500 mg via INTRAVENOUS
  Filled 2014-11-14: qty 100

## 2014-11-14 MED ORDER — HEPARIN SODIUM (PORCINE) 5000 UNIT/ML IJ SOLN
5000.0000 [IU] | Freq: Three times a day (TID) | INTRAMUSCULAR | Status: DC
  Administered 2014-11-15 – 2014-11-17 (×8): 5000 [IU] via SUBCUTANEOUS
  Filled 2014-11-14 (×11): qty 1

## 2014-11-14 MED ORDER — ASPIRIN EC 81 MG PO TBEC
81.0000 mg | DELAYED_RELEASE_TABLET | Freq: Every day | ORAL | Status: DC
  Administered 2014-11-15 – 2014-11-17 (×4): 81 mg via ORAL
  Filled 2014-11-14 (×5): qty 1

## 2014-11-14 MED ORDER — ALBUTEROL SULFATE (2.5 MG/3ML) 0.083% IN NEBU: 2.5000 mg | INHALATION_SOLUTION | RESPIRATORY_TRACT | Status: DC

## 2014-11-14 NOTE — ED Notes (Signed)
Bed: WA02 Expected date:  Expected time:  Means of arrival:  Comments: Ems/81f/sob/BACK PAIN
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
## 2014-11-14 NOTE — ED Notes (Signed)
Per EMS-SOB and left flank pain since Saturday. Denies fever/N/V/cough. 90% SpO2 on RA. Hx COPD. On 3 L per [place] at home. 12 lead unremarkable. 18 G PIV in left FA. No edema noted. Lung sounds clear. VSS: RR 24 SpO2 97% HR 85 BP 121/81.

## 2014-11-15 DIAGNOSIS — J441 Chronic obstructive pulmonary disease with (acute) exacerbation: Principal | ICD-10-CM

## 2014-11-15 DIAGNOSIS — R350 Frequency of micturition: Secondary | ICD-10-CM

## 2014-11-15 DIAGNOSIS — I10 Essential (primary) hypertension: Secondary | ICD-10-CM

## 2014-11-15 DIAGNOSIS — J9620 Acute and chronic respiratory failure, unspecified whether with hypoxia or hypercapnia: Secondary | ICD-10-CM | POA: Diagnosis present

## 2014-11-15 DIAGNOSIS — K219 Gastro-esophageal reflux disease without esophagitis: Secondary | ICD-10-CM

## 2014-11-15 LAB — URINE CULTURE
COLONY COUNT: NO GROWTH
CULTURE: NO GROWTH

## 2014-11-15 MED ORDER — ONDANSETRON HCL 4 MG/2ML IJ SOLN
4.0000 mg | Freq: Four times a day (QID) | INTRAMUSCULAR | Status: DC | PRN
  Administered 2014-11-15: 4 mg via INTRAVENOUS
  Filled 2014-11-15: qty 2

## 2014-11-15 MED ORDER — METHYLPREDNISOLONE SODIUM SUCC 40 MG IJ SOLR
40.0000 mg | Freq: Two times a day (BID) | INTRAMUSCULAR | Status: DC
  Administered 2014-11-15 – 2014-11-16 (×2): 40 mg via INTRAVENOUS
  Filled 2014-11-15 (×3): qty 1

## 2014-11-15 MED ORDER — IPRATROPIUM-ALBUTEROL 0.5-2.5 (3) MG/3ML IN SOLN: 3.0000 mL | Freq: Four times a day (QID) | RESPIRATORY_TRACT | Status: DC | PRN

## 2014-11-15 MED ORDER — IPRATROPIUM-ALBUTEROL 0.5-2.5 (3) MG/3ML IN SOLN
3.0000 mL | Freq: Four times a day (QID) | RESPIRATORY_TRACT | Status: DC
  Administered 2014-11-15 – 2014-11-17 (×8): 3 mL via RESPIRATORY_TRACT
  Filled 2014-11-15 (×8): qty 3

## 2014-11-15 NOTE — Progress Notes (Signed)
Patient with complaints of nausea, MD notified awaiting callback Neta Mends RN 9:38 AM 11-15-2014

## 2014-11-15 NOTE — Progress Notes (Signed)
Progress Note   [name] [id number] DOB: 08-Aug-1931 DOA: 11/14/2014 PCP: Dorris Carnes, MD   Brief Narrative:   [name] is an 81 y.o. female with a PMH of COPD/asthma, hypertension and osteoarthritis who was admitted 11/14/14 with chief complaint of shortness of breath and back pain.  Assessment/Plan:   Principal Problem:   Acute on chronic respiratory failure secondary to COPD exacerbation - Continue supplemental oxygen, broncho-dilators, Solu-Medrol, and empiric doxycycline. - Wean steroids as tolerated.  Active Problems:   Essential hypertension - Continue diltiazem.    GERD (gastroesophageal reflux disease) - Continue PPI.    Back pain - Continue pain management with Tylenol and ibuprofen.    Urinary frequency - Continue oxybutynin. Follow-up urine cultures.    DVT Prophylaxis - Continue heparin.  Code Status: Full. Family Communication: No family at bedside. Declines my offer to call her son. Disposition Plan: Home when stable.   IV Access:    Peripheral IV   Procedures and diagnostic studies:   Dg Chest 2 View  11/14/2014   CLINICAL DATA:  Shortness of breath with back pain.  Incontinent.  EXAM: CHEST  2 VIEW  COMPARISON:  Chest radiograph 03/31/2014.  FINDINGS: Cardiomegaly. Calcified tortuous aorta. Degenerative changes of the spine. Osteopenia. Vascular congestion without overt failure or infiltrates.  IMPRESSION: Cardiomegaly. No definite active infiltrates. Mild vascular congestion is stable. Improved aeration from priors.   Electronically Signed   By: Rolla Flatten M.D.   On: 11/14/2014 20:15     Medical Consultants:    None.  Anti-Infectives:    Levaquin  11/14/14  Doxycycline 11/14/14--->  Subjective:    [name] tells me she is a bit nauseated and has occasional dyspnea and hoarseness, but overall feels a bit better today than she did yesterday. Appetite poor, but she is hoping to try to eat some dinner  tonight.  Objective:    Filed Vitals:   11/14/14 2202 11/14/14 2300 11/15/14 0520 11/15/14 0531  BP: 141/62 128/68 108/91   Pulse: 63 57 67   Temp:   97.9 F (36.6 C)   TempSrc:  Oral Oral   Resp: 25 18 18    Height:    5\' 5"  (1.651 m)  Weight:   83.915 kg (185 lb)   SpO2: 97% 96% 94%     Intake/Output Summary (Last 24 hours) at 11/15/14 0933 Last data filed at 11/15/14 0608  Gross per 24 hour  Intake 588.33 ml  Output    700 ml  Net -111.67 ml    Exam: Gen:  NAD Cardiovascular:  RRR, No M/R/G Respiratory:  Lungs diminished Gastrointestinal:  Abdomen soft, NT/ND, + BS Extremities:  No C/E/C   Data Reviewed:    Labs: Basic Metabolic Panel:  Recent Labs Lab 11/14/14 1939  NA 141  K 4.2  CL 104  CO2 28  GLUCOSE 126*  BUN 16  CREATININE 0.78  CALCIUM 8.7*   GFR Estimated Creatinine Clearance: 59 mL/min (by C-G formula based on Cr of 0.78). Liver Function Tests:  Recent Labs Lab 11/14/14 1939  AST 20  ALT 17  ALKPHOS 71  BILITOT 0.5  PROT 6.8  ALBUMIN 3.4*   CBC:  Recent Labs Lab 11/14/14 1939  WBC 6.9  NEUTROABS 3.9  HGB 13.3  HCT 42.0  MCV 97.7  PLT 259   Cardiac Enzymes:  Recent Labs Lab 11/14/14 1939  TROPONINI <0.03   Microbiology No results found for this or any previous visit (  from the past 240 hour(s)).   Medications:   . sodium chloride   Intravenous STAT  . aspirin EC  81 mg Oral Daily  . diltiazem  60 mg Oral Q12H  . doxycycline  100 mg Oral Q12H  . heparin  5,000 Units Subcutaneous 3 times per day  . ipratropium-albuterol  3 mL Nebulization QID  . methylPREDNISolone (SOLU-MEDROL) injection  60 mg Intravenous Q6H  . pantoprazole  40 mg Oral Daily  . phenazopyridine  100 mg Oral TID WC   Continuous Infusions: . sodium chloride 100 mL/hr (11/15/14 0015)    Time spent: 25 minutes.   LOS: 1 day   Shaheim Mahar  [facility] Pager [phone]. If unable to reach me by pager, please call my cell phone at  [phone].  *Please refer to [email or web address], password [email or web address] to get updated schedule on who will round on this patient, as hospitalists switch teams weekly. If 7PM-7AM, please contact night-coverage at [email or web address], password [email or web address] for any overnight needs.  11/15/2014, 9:33 AM

## 2014-11-16 DIAGNOSIS — J9621 Acute and chronic respiratory failure with hypoxia: Secondary | ICD-10-CM

## 2014-11-16 MED ORDER — PREDNISONE 10 MG PO TABS: ORAL_TABLET | ORAL | Status: DC

## 2014-11-16 MED ORDER — METHYLPREDNISOLONE SODIUM SUCC 40 MG IJ SOLR
40.0000 mg | Freq: Every day | INTRAMUSCULAR | Status: DC
  Administered 2014-11-17: 40 mg via INTRAVENOUS
  Filled 2014-11-16: qty 1

## 2014-11-16 MED ORDER — IPRATROPIUM-ALBUTEROL 0.5-2.5 (3) MG/3ML IN SOLN: 3.0000 mL | Freq: Four times a day (QID) | RESPIRATORY_TRACT | Status: DC

## 2014-11-16 MED ORDER — IPRATROPIUM-ALBUTEROL 0.5-2.5 (3) MG/3ML IN SOLN: 3.0000 mL | Freq: Four times a day (QID) | RESPIRATORY_TRACT | Status: DC | PRN

## 2014-11-16 MED ORDER — DOXYCYCLINE HYCLATE 100 MG PO TABS: 100.0000 mg | ORAL_TABLET | Freq: Two times a day (BID) | ORAL | Status: DC

## 2014-11-16 MED ORDER — PHENAZOPYRIDINE HCL 100 MG PO TABS: 100.0000 mg | ORAL_TABLET | Freq: Three times a day (TID) | ORAL | Status: DC

## 2014-11-16 MED ORDER — OXYBUTYNIN CHLORIDE 5 MG PO TABS: 5.0000 mg | ORAL_TABLET | Freq: Three times a day (TID) | ORAL | Status: DC | PRN

## 2014-11-16 MED ORDER — OXYCODONE HCL 5 MG PO TABS: 5.0000 mg | ORAL_TABLET | ORAL | Status: DC | PRN

## 2014-11-16 NOTE — Progress Notes (Addendum)
Chaplain visited patient. Patient spoke of the goodness of the [place], her illness and her children. Chaplain provided a listening presence. Chaplain will follow up with patient.   11/16/14 1200  Clinical Encounter Type  Visited With Patient  Visit Type Initial;Spiritual support;Social support  Referral From [employer]

## 2014-11-16 NOTE — Discharge Instructions (Signed)

## 2014-11-16 NOTE — Progress Notes (Addendum)
Progress Note   [name] [id number] DOB: [birth year] DOA: 11/14/2014 PCP: Dorris Carnes, MD   Brief Narrative:   81 y.o. female with a PMH of COPD/asthma, hypertension and osteoarthritis who was admitted 11/14/14 with chief complaint of shortness of breath and back pain.  Assessment/Plan:   Principal Problem:   Acute on chronic respiratory failure secondary to COPD exacerbation - Mild wheezing on expiration noted on exam this morning-  - Continue supplemental oxygen, broncho-dilators, Solu-Medrol, and empiric doxycycline. - unable to wean off oxygen at this time, possibly in next 24 hours   Active Problems:   Essential hypertension - Continue diltiazem. - Reasonably stable while inpatient    GERD (gastroesophageal reflux disease) - Continue PPI.    Back pain - Continue pain management with Tylenol and ibuprofen.    Urinary frequency - Continue oxybutynin. Follow-up urine cultures.    DVT Prophylaxis - Continue heparin SQ  Code Status: Full. Family Communication: No family at bedside. Declines my offer to call her son. Disposition Plan: Home 11/17/2014  IV Access:    Peripheral IV  Procedures and diagnostic studies:   Dg Chest 2 View 11/14/2014    Cardiomegaly. No definite active infiltrates. Mild vascular congestion is stable. Improved aeration  Medical Consultants:    None.  Anti-Infectives:    Levaquin  11/14/14  Doxycycline 11/14/14--->  Subjective:   Reports exertional dyspnea this morning.   Objective:    Filed Vitals:   11/15/14 2039 11/15/14 2150 11/16/14 0647 11/16/14 1030  BP:  150/66 119/50   Pulse:  61 60   Temp:  98.2 F (36.8 C) 98.3 F (36.8 C)   TempSrc:  Oral Oral   Resp:  18 18   Height:      Weight:      SpO2: 94% 96% 98% 96%    Intake/Output Summary (Last 24 hours) at 11/16/14 1208 Last data filed at 11/16/14 6256  Gross per 24 hour  Intake 3226.67 ml  Output   3800 ml  Net -573.33 ml    Exam: Gen:   NAD Cardiovascular:  Regular rate and rhythm, no murmurs noted  Respiratory:  diminished breath sounds bilaterally with mild expiratory wheezing  Gastrointestinal:  Abdomen soft, NT/ND, + BS Extremities:  No C/E/C  Data Reviewed:    Labs: Basic Metabolic Panel:  Recent Labs Lab 11/14/14 1939  NA 141  K 4.2  CL 104  CO2 28  GLUCOSE 126*  BUN 16  CREATININE 0.78  CALCIUM 8.7*   GFR Estimated Creatinine Clearance: 59 mL/min (by C-G formula based on Cr of 0.78). Liver Function Tests:  Recent Labs Lab 11/14/14 1939  AST 20  ALT 17  ALKPHOS 71  BILITOT 0.5  PROT 6.8  ALBUMIN 3.4*   CBC:  Recent Labs Lab 11/14/14 1939  WBC 6.9  NEUTROABS 3.9  HGB 13.3  HCT 42.0  MCV 97.7  PLT 259   Cardiac Enzymes:  Recent Labs Lab 11/14/14 1939  TROPONINI <0.03   Microbiology Recent Results (from the past 240 hour(s))  Culture, Urine     Status: None   Collection Time: 11/14/14 10:10 PM  Result Value Ref Range Status   Specimen Description URINE, RANDOM  Final   Special Requests NONE  Final   Colony Count NO GROWTH Performed at [facility]   Final   Culture NO GROWTH Performed at [facility]   Final   Report Status 11/15/2014 FINAL  Final  Medications:   . aspirin EC  81 mg Oral Daily  . diltiazem  60 mg Oral Q12H  . doxycycline  100 mg Oral Q12H  . heparin  5,000 Units Subcutaneous 3 times per day  . ipratropium-albuterol  3 mL Nebulization QID  . methylPREDNISolone (SOLU-MEDROL) injection  40 mg Intravenous Q12H  . pantoprazole  40 mg Oral Daily  . phenazopyridine  100 mg Oral TID WC   Continuous Infusions: . sodium chloride 100 mL/hr at 11/16/14 0943    Time spent: 25 minutes.   LOS: 2 days   Faye Ramsay  [facility] Pager [phone]. If unable to reach me by pager, please call my cell phone at [phone]  *Please refer to [email or web address], password [email or web address] to get updated schedule on who will round on this patient,  as hospitalists switch teams weekly. If 7PM-7AM, please contact night-coverage at [email or web address], password [email or web address] for any overnight needs.  11/16/2014, 12:08 PM

## 2014-11-17 LAB — CBC
HEMATOCRIT: 38.1 % (ref 36.0–46.0)
Hemoglobin: 11.6 g/dL — ABNORMAL LOW (ref 12.0–15.0)
MCH: 30 pg (ref 26.0–34.0)
MCHC: 30.4 g/dL (ref 30.0–36.0)
MCV: 98.4 fL (ref 78.0–100.0)
Platelets: 249 10*3/uL (ref 150–400)
RBC: 3.87 MIL/uL (ref 3.87–5.11)
RDW: 13 % (ref 11.5–15.5)
WBC: 7.7 10*3/uL (ref 4.0–10.5)

## 2014-11-17 LAB — BASIC METABOLIC PANEL
Anion gap: 5 (ref 5–15)
BUN: 21 mg/dL — ABNORMAL HIGH (ref 6–20)
CO2: 33 mmol/L — AB (ref 22–32)
Calcium: 8.4 mg/dL — ABNORMAL LOW (ref 8.9–10.3)
Chloride: 104 mmol/L (ref 101–111)
Creatinine, Ser: 0.79 mg/dL (ref 0.44–1.00)
GFR calc Af Amer: >60 mL/min (ref >60)
Glucose, Bld: 118 mg/dL — ABNORMAL HIGH (ref 65–99)
POTASSIUM: 3.6 mmol/L (ref 3.5–5.1)
SODIUM: 142 mmol/L (ref 135–145)

## 2014-11-17 MED ORDER — PREDNISONE 10 MG PO TABS: ORAL_TABLET | ORAL | Status: DC

## 2014-11-17 MED ORDER — PHENAZOPYRIDINE HCL 100 MG PO TABS: 100.0000 mg | ORAL_TABLET | Freq: Three times a day (TID) | ORAL | Status: DC

## 2014-11-17 MED ORDER — DOXYCYCLINE HYCLATE 100 MG PO TABS: 100.0000 mg | ORAL_TABLET | Freq: Two times a day (BID) | ORAL | Status: DC

## 2014-11-17 MED ORDER — IPRATROPIUM-ALBUTEROL 0.5-2.5 (3) MG/3ML IN SOLN: 3.0000 mL | Freq: Four times a day (QID) | RESPIRATORY_TRACT | Status: DC

## 2014-11-17 MED ORDER — OXYBUTYNIN CHLORIDE 5 MG PO TABS: 5.0000 mg | ORAL_TABLET | Freq: Three times a day (TID) | ORAL | Status: DC | PRN

## 2014-11-17 MED ORDER — IPRATROPIUM-ALBUTEROL 0.5-2.5 (3) MG/3ML IN SOLN: 3.0000 mL | Freq: Four times a day (QID) | RESPIRATORY_TRACT | Status: DC | PRN

## 2014-11-17 NOTE — Progress Notes (Signed)
PT Cancellation Note  Patient Details Name: MILKA WINDHOLZ MRN: [id number] DOB: 22-May-1932   Cancelled Treatment:    Reason Eval/Treat Not Completed: Attempted PT eval this am-pt unavailable at this time. will check back later as schedule permits. Thanks.    Weston Anna, MPT Pager: [phone]

## 2014-11-17 NOTE — Evaluation (Signed)
Physical Therapy Evaluation Patient Details Name: [name] MRN: [id number] DOB: 05/30/1932 Today's Date: 11/17/2014   History of Present Illness  81 yo female admitted with acute on chronic respiratory failure. Hx of COPD, HTN, OA, osteopenia.   Clinical Impression  On eval, pt required Min guard assist for mobility-able to walk ~30'x2 with walker. Dyspnea 2-3/4, O2 sats 89% on 4L O2 with ambulation. Rest break needed/taken after 30 feet. Recommend HHPT and 4 wheeled walker for improved stability and option to take seated rest breaks when needed due to decreased activity tolerance.     Follow Up Recommendations Home health PT    Equipment Recommendations   (4 wheeled rolling walker)    Recommendations for Other Services       Precautions / Restrictions Precautions Precautions: Fall Precaution Comments: O2 dep Restrictions Weight Bearing Restrictions: No      Mobility  Bed Mobility               General bed mobility comments: pt sitting EOB  Transfers Overall transfer level: Needs assistance Equipment used: Rolling walker (2 wheeled) Transfers: Sit to/from Stand Sit to Stand: Min guard         General transfer comment: close guard for safety  Ambulation/Gait Ambulation/Gait assistance: Min guard Ambulation Distance (Feet): 30 Feet (x2) Assistive device: Rolling walker (2 wheeled) Gait Pattern/deviations: Step-through pattern;Decreased stride length;Trunk flexed     General Gait Details: slow gait speed. close guard for safety. dyspnea 2-3/4. O2 sats 89% 4L O2 during ambulation. Rest break needed after ~30 feet.   Stairs            Wheelchair Mobility    Modified Rankin (Stroke Patients Only)       Balance                                             Pertinent Vitals/Pain Pain Assessment: No/denies pain    Home Living Family/patient expects to be discharged to:: Private residence Living Arrangements: Children    Type of Home: Apartment Home Access: Stairs to enter   [employer] of Steps: 1 curb step Home Layout: Bed/bath upstairs;Two level Home Equipment: None (oxygen)      Prior Function Level of Independence: Independent               Hand Dominance        Extremity/Trunk Assessment   Upper Extremity Assessment: Generalized weakness           Lower Extremity Assessment: Generalized weakness      Cervical / Trunk Assessment: Kyphotic  Communication   Communication: No difficulties  Cognition Arousal/Alertness: Awake/alert Behavior During Therapy: WFL for tasks assessed/performed Overall Cognitive Status: Within Functional Limits for tasks assessed                      General Comments      Exercises        Assessment/Plan    PT Assessment All further PT needs can be met in the next venue of care (HHPT)  PT Diagnosis Difficulty walking;Generalized weakness   PT Problem List Decreased mobility;Decreased balance;Decreased activity tolerance  PT Treatment Interventions     PT Goals (Current goals can be found in the Care Plan section) Acute Rehab PT Goals Patient Stated Goal: home today PT Goal Formulation: All assessment and education complete, DC therapy  Frequency     Barriers to discharge        Co-evaluation               End of Session Equipment Utilized During Treatment: Gait belt;Oxygen Activity Tolerance: Patient limited by fatigue Patient left: in bed;with call bell/phone within reach;with family/visitor present           Time: 1124-1140 PT Time Calculation (min) (ACUTE ONLY): 16 min   Charges:   PT Evaluation $Initial PT Evaluation Tier I: 1 Procedure     PT G Codes:        Weston Anna, MPT Pager: [phone]

## 2014-11-17 NOTE — Progress Notes (Signed)
Chaplain visited patient. Patient was working with Physical Therapy and doing very well. Chaplain encouraged patient. Chaplain provided emotional support.   11/17/14 1100  Clinical Encounter Type  Visited With Patient  Visit Type Follow-up;Spiritual support;Social support  Referral From Chaplain   Patient was expecting discharge today.

## 2014-11-17 NOTE — Progress Notes (Signed)
Pt discharged, given instructions.  Verbalized understanding.  Unchanged assessment, no pain.  Via wheelchair transport.  Pt refused PT.  Pt on 3 liters of oxygen

## 2014-11-17 NOTE — Care Management Note (Signed)
Case Management Note  Patient Details  Name: [name] MRN: [id number] Date of Birth: 27-May-1932  Subjective/Objective:        Admitted with acute on chronic respiratory failure          Action/Plan: Discharge planning, spoke with patient at bedside. States she has O2 tank that her son is bringing her for transport home. She was concerned that he did not know how to fill it. Per bedside nurse tank appears to have adequate supply to get her home. Order received for RW but patient declines, she does not feel she needs it. Cancelled with [facility].  Expected Discharge Date:   (unknown)               Expected Discharge Plan:  Home/Self Care  In-House Referral:  NA  Discharge planning Services  CM Consult  Post Acute Care Choice:  Durable Medical Equipment Choice offered to:     DME Arranged:  Walker rolling DME Agency:  [place]:    [place] Agency:     Status of Service:  Completed, signed off  Medicare Important Message Given:  Yes Date Medicare IM Given:  11/17/14 Medicare IM give by:  Sunday Spillers RN CM  Date Additional Medicare IM Given:    Additional Medicare Important Message give by:     If discussed at [place] of Stay Meetings, dates discussed:    Additional Comments:  Guadalupe Maple, RN 11/17/2014, 12:12 PM

## 2014-11-17 NOTE — Discharge Summary (Signed)
Physician Discharge Summary  [name] TDD:[id number] DOB: 1931/12/08 DOA: 11/14/2014  PCP: Dorris Carnes, MD  Admit date: 11/14/2014 Discharge date: 11/17/2014  Recommendations for Outpatient Follow-up:  1. Pt will need to follow up with PCP in 2-3 weeks post discharge 2. Please obtain BMP to evaluate electrolytes and kidney function 3. Please also check CBC to evaluate Hg and Hct levels 4. Continue Prednisone taper upon discharge starting with 50 mg daily and taper down by 10 mg daily until completed 5. Also continue Doxycycline for 5 more days post discharge  6. Pt on 3 L Oxygen prior to discharge, reported she has oxygen at home   Discharge Diagnoses:  Principal Problem:   Acute on chronic respiratory failure Active Problems:   COPD exacerbation  Discharge Condition: Stable  Diet recommendation: Heart healthy diet discussed in details   Brief Narrative:   81 y.o. female with a PMH of COPD/asthma, hypertension and osteoarthritis who was admitted 11/14/14 with chief complaint of shortness of breath and back pain.  Assessment/Plan:   Principal Problem:  Acute on chronic respiratory failure secondary to COPD exacerbation - No wheezing on expiration noted on exam this morning-  - Continue broncho-dilators, prednisone taper and empiric doxycycline as noted below  - continue oxygen via [place] at home with exertion and at rest   Active Problems:  Essential hypertension - Continue diltiazem. - Reasonably stable while inpatient   GERD (gastroesophageal reflux disease) - Continue PPI.   Back pain - Continue pain management with Tylenol if needed    Urinary frequency - Continue oxybutynin. Follow-up urine cultures.   Code Status: Full. Family Communication: No family at bedside. Declines my offer to call her son. Disposition Plan: Home   IV Access:    Peripheral IV  Procedures and diagnostic studies:   Dg Chest 2 View 11/14/2014 Cardiomegaly. No  definite active infiltrates. Mild vascular congestion is stable. Improved aeration  Medical Consultants:    None.  Anti-Infectives:    Doxycycline 11/14/14--->5 more days post discharge       Discharge Exam: Filed Vitals:   11/16/14 2230  BP: 115/47  Pulse: 66  Temp: 98.9 F (37.2 C)  Resp: 18   Filed Vitals:   11/16/14 1400 11/16/14 2115 11/16/14 2230 11/17/14 0902  BP: 119/85  115/47   Pulse: 85  66   Temp: 98.8 F (37.1 C)  98.9 F (37.2 C)   TempSrc: Oral  Oral   Resp: 18  18   Height:      Weight:      SpO2: 99% 94% 96% 95%    General: Pt is alert, follows commands appropriately, not in acute distress Cardiovascular: Regular rate and rhythm, S1/S2 +, no murmurs, no rubs, no gallops Respiratory: Clear to auscultation bilaterally, no wheezing, no crackles, no rhonchi Abdominal: Soft, non tender, non distended, bowel sounds +, no guarding  Discharge Instructions  Discharge Instructions    Diet - low sodium heart healthy    Complete by:  As directed      Increase activity slowly    Complete by:  As directed             Medication List    TAKE these medications        albuterol 108 (90 BASE) MCG/ACT inhaler  Commonly known as:  PROAIR HFA  Inhale 2 puffs into the lungs every 6 (six) hours as needed for wheezing or shortness of breath.     aspirin EC 81 MG tablet  Take 81 mg by mouth daily as needed.     BC HEADACHE POWDER PO  Take 0.5-1 packets by mouth 2 (two) times daily as needed (for pain).     diltiazem 60 MG tablet  Commonly known as:  CARDIZEM  Take one tablet by mouth twice daily     doxycycline 100 MG tablet  Commonly known as:  VIBRA-TABS  Take 1 tablet (100 mg total) by mouth every 12 (twelve) hours.     ezetimibe 10 MG tablet  Commonly known as:  ZETIA  Take 1 tablet (10 mg total) by mouth daily.     ipratropium-albuterol 0.5-2.5 (3) MG/3ML Soln  Commonly known as:  DUONEB  Take 3 mLs by nebulization 4 (four) times  daily.     ipratropium-albuterol 0.5-2.5 (3) MG/3ML Soln  Commonly known as:  DUONEB  Take 3 mLs by nebulization every 6 (six) hours as needed.     oxybutynin 5 MG tablet  Commonly known as:  DITROPAN  Take 1 tablet (5 mg total) by mouth every 8 (eight) hours as needed for bladder spasms.     oxyCODONE 5 MG immediate release tablet  Commonly known as:  Oxy IR/ROXICODONE  Take 1 tablet (5 mg total) by mouth every 4 (four) hours as needed for moderate pain.     pantoprazole 40 MG tablet  Commonly known as:  PROTONIX  Take 1 tablet (40 mg total) by mouth daily.     phenazopyridine 100 MG tablet  Commonly known as:  PYRIDIUM  Take 1 tablet (100 mg total) by mouth 3 (three) times daily with meals.     predniSONE 10 MG tablet  Commonly known as:  DELTASONE  Take 50 mg tablet today and taper down by 10 mg daily until completed            Follow-up Information    Follow up with Dorris Carnes, MD.   Specialty:  Cardiology   Contact information:   [address] [place] [address] [phone]       Call Faye Ramsay, MD.   Specialty:  Internal Medicine   Why:  As needed call my cell phone [phone]   Contact information:   [address] [place] [address] [phone]        The results of significant diagnostics from this hospitalization (including imaging, microbiology, ancillary and laboratory) are listed below for reference.     Microbiology: Recent Results (from the past 240 hour(s))  Culture, Urine     Status: None   Collection Time: 11/14/14 10:10 PM  Result Value Ref Range Status   Specimen Description URINE, RANDOM  Final   Special Requests NONE  Final   Colony Count NO GROWTH Performed at [facility]   Final   Culture NO GROWTH Performed at [facility]   Final   Report Status 11/15/2014 FINAL  Final     Labs: Basic Metabolic Panel:  Recent Labs Lab 11/14/14 1939 11/17/14 0541   NA 141 142  K 4.2 3.6  CL 104 104  CO2 28 33*  GLUCOSE 126* 118*  BUN 16 21*  CREATININE 0.78 0.79  CALCIUM 8.7* 8.4*   Liver Function Tests:  Recent Labs Lab 11/14/14 1939  AST 20  ALT 17  ALKPHOS 71  BILITOT 0.5  PROT 6.8  ALBUMIN 3.4*   CBC:  Recent Labs Lab 11/14/14 1939 11/17/14 0541  WBC 6.9 7.7  NEUTROABS 3.9  --  HGB 13.3 11.6*  HCT 42.0 38.1  MCV 97.7 98.4  PLT 259 249   Cardiac Enzymes:  Recent Labs Lab 11/14/14 1939  TROPONINI <0.03   BNP: BNP (last 3 results)  Recent Labs  11/14/14 1939  BNP 76.1    SIGNED: Time coordinating discharge: 30 minutes  MAGICK-Arien Benincasa, MD  [facility] 11/17/2014, 9:30 AM Pager [phone]  If 7PM-7AM, please contact night-coverage [email or web address] Password [email or web address]

## 2014-11-22 ENCOUNTER — Telehealth: Payer: Self-pay | Admitting: *Deleted

## 2014-11-22 NOTE — Telephone Encounter (Signed)
-----   Message -----     From: Fay Records, MD    Sent: 11/21/2014 10:57 PM     To: [facility] Triage   Subject: FW: Inpatient Notes                   Pt was admitted to [facility] with COPD exacerbatoin.   I am not the primary care for pt.   Seen in IM and pulmonary   She should have f/u in one of these clinics   Please change primary listed   ----- Message -----    From: Theodis Blaze, MD    Sent: 11/21/2014 10:02 PM     To: Fay Records, MD   Subject: Inpatient Notes                     Called to speak with patient.  No answer, left message to call back. Called [facility], appointment made with Tammy Parrett for post hospital f/u on July 8, 4pm (this was their first available). As for primary care, patient was seen by [facility] during admission, not seen since 2013 by Dr. Alice Rieger (Internal Medicine).  Spoke with Maudie Mercury Mesimore, HIM re: removing Dr. Harrington Challenger from being listed as PCP, she will forward appropriately.

## 2014-11-23 NOTE — Telephone Encounter (Signed)
Spoke with patient.  Informed of appointment with Tammy Parrett with [facility].  July 8, 4PM

## 2014-12-16 ENCOUNTER — Inpatient Hospital Stay: Payer: Medicare Other | Admitting: Adult Health

## 2015-01-10 ENCOUNTER — Inpatient Hospital Stay: Payer: Medicare Other | Admitting: Adult Health

## 2015-02-06 ENCOUNTER — Ambulatory Visit (INDEPENDENT_AMBULATORY_CARE_PROVIDER_SITE_OTHER): Payer: Medicare Other | Admitting: Adult Health

## 2015-02-06 ENCOUNTER — Encounter: Payer: Self-pay | Admitting: Adult Health

## 2015-02-06 VITALS — BP 126/84 | HR 71 | Temp 98.5°F | Ht 64.0 in | Wt 185.0 lb

## 2015-02-06 DIAGNOSIS — J9611 Chronic respiratory failure with hypoxia: Secondary | ICD-10-CM

## 2015-02-06 DIAGNOSIS — J441 Chronic obstructive pulmonary disease with (acute) exacerbation: Secondary | ICD-10-CM | POA: Diagnosis not present

## 2015-02-06 MED ORDER — PREDNISONE 10 MG PO TABS: ORAL_TABLET | ORAL | Status: DC

## 2015-02-06 NOTE — Patient Instructions (Addendum)
Prednisone taper over next week.  Mucinex Twice daily  As needed  Cough/congestion .  Claritin 10mg  At bedtime  As needed  Drainage  . Saline nasal rinses As needed   Please contact office for sooner follow up if symptoms do not improve or worsen or seek emergency care  Follow up Dr. Ashok Cordia   In 3 months and As needed

## 2015-02-06 NOTE — Progress Notes (Signed)
Subjective:    Patient ID: [name], female    DOB: 03-05-1932, 81 y.o.   MRN: [id number]  HPI 81 yo female with known hx of COPD -O2 dependent   02/06/2015 Follow up : COPD /O2 dependent /Diastolic Dysfunction  Pt returns for follow up .  Says she has not been doing as well over the last week. More drainage, cough and dyspnea. No fever or discolored mucus .  Heat causing her breathing to be worse Recent admission with COPD flare 11/14/14 , tx w/ abx and steroids . CXR with no acute process.  Remains on Duoneb Four times a day  .  Not taking anything for drainage or cough .  On Oxygen 3l/m .        Review of Systems Constitutional:   No  weight loss, night sweats,  Fevers, chills,  +fatigue, or  lassitude.  HEENT:   No headaches,  Difficulty swallowing,  Tooth/dental problems, or  Sore throat,                No sneezing, itching, ear ache,  +nasal congestion, post nasal drip,   CV:  No chest pain,  Orthopnea, PND, swelling in lower extremities, anasarca, dizziness, palpitations, syncope.   GI  No heartburn, indigestion, abdominal pain, nausea, vomiting, diarrhea, change in bowel habits, loss of appetite, bloody stools.   Resp:  No chest wall deformity  Skin: no rash or lesions.  GU: no dysuria, change in color of urine, no urgency or frequency.  No flank pain, no hematuria   MS:  No joint pain or swelling.  No decreased range of motion.  No back pain.  Psych:  No change in mood or affect. No depression or anxiety.  No memory loss.     Objective:   Physical Exam BP 126/84 mmHg  Pulse 71  Temp(Src) 98.5 F (36.9 C) (Oral)  Ht 5\' 4"  (1.626 m)  Wt 185 lb (83.915 kg)  BMI 31.74 kg/m2  SpO2 92%  GEN: A/Ox3; pleasant , NAD, chronically ill appearing , in wheelchair   HEENT:  [place]/AT,  EACs-clear, TMs-wnl, NOSE-clear, THROAT-clear, no lesions, no postnasal drip or exudate noted.   NECK:  Supple w/ fair ROM; no JVD; normal carotid impulses w/o bruits; no thyromegaly  or nodules palpated; no lymphadenopathy.  RESP  Decreased BS in bases , no  accessory muscle use, no dullness to percussion  CARD:  RRR, no m/r/g  , no peripheral edema, pulses intact, no cyanosis or clubbing.  GI:   Soft & nt; nml bowel sounds; no organomegaly or masses detected.  Musco: Warm bil, no deformities or joint swelling noted.   Neuro: alert, no focal deficits noted.    Skin: Warm, no lesions or rashes         Assessment & Plan:   No problem-specific assessment & plan notes found for this encounter.   Updated Medication List Outpatient Encounter Prescriptions as of 02/06/2015  Medication Sig  . albuterol (PROAIR HFA) 108 (90 BASE) MCG/ACT inhaler Inhale 2 puffs into the lungs every 6 (six) hours as needed for wheezing or shortness of breath.  Marland Kitchen aspirin EC 81 MG tablet Take 81 mg by mouth daily as needed.   . Aspirin-Salicylamide-Caffeine (BC HEADACHE POWDER PO) Take 0.5-1 packets by mouth 2 (two) times daily as needed (for pain).   Marland Kitchen diltiazem (CARDIZEM) 60 MG tablet Take one tablet by mouth twice daily  . ipratropium-albuterol (DUONEB) 0.5-2.5 (3) MG/3ML SOLN Take 3 mLs  by nebulization 4 (four) times daily.  Marland Kitchen oxybutynin (DITROPAN) 5 MG tablet Take 1 tablet (5 mg total) by mouth every 8 (eight) hours as needed for bladder spasms.  . pantoprazole (PROTONIX) 40 MG tablet Take 1 tablet (40 mg total) by mouth daily.  Marland Kitchen doxycycline (VIBRA-TABS) 100 MG tablet Take 1 tablet (100 mg total) by mouth every 12 (twelve) hours. (Patient not taking: Reported on 02/06/2015)  . ezetimibe (ZETIA) 10 MG tablet Take 1 tablet (10 mg total) by mouth daily. (Patient not taking: Reported on 02/06/2015)  . oxyCODONE (OXY IR/ROXICODONE) 5 MG immediate release tablet Take 1 tablet (5 mg total) by mouth every 4 (four) hours as needed for moderate pain. (Patient not taking: Reported on 02/06/2015)  . phenazopyridine (PYRIDIUM) 100 MG tablet Take 1 tablet (100 mg total) by mouth 3 (three) times daily  with meals. (Patient not taking: Reported on 02/06/2015)  . predniSONE (DELTASONE) 10 MG tablet Take 50 mg tablet today and taper down by 10 mg daily until completed (Patient not taking: Reported on 02/06/2015)  . [DISCONTINUED] ipratropium-albuterol (DUONEB) 0.5-2.5 (3) MG/3ML SOLN Take 3 mLs by nebulization every 6 (six) hours as needed. (Patient not taking: Reported on 02/06/2015)   No facility-administered encounter medications on file as of 02/06/2015.

## 2015-02-07 NOTE — Assessment & Plan Note (Signed)
Exacerbation with Allergic Rhinitis   Plan  Prednisone taper over next week.  Mucinex Twice daily  As needed  Cough/congestion .  Claritin 10mg  At bedtime  As needed  Drainage  . Saline nasal rinses As needed   Please contact office for sooner follow up if symptoms do not improve or worsen or seek emergency care  Follow up Dr. Ashok Cordia   In 3 months and As needed

## 2015-02-07 NOTE — Assessment & Plan Note (Signed)
Compensated on O2  

## 2015-02-09 ENCOUNTER — Telehealth: Payer: Self-pay | Admitting: Adult Health

## 2015-02-09 NOTE — Telephone Encounter (Signed)
Spoke with pt, wanted to clarify how to take claritin and mucinex.  All questions were answered.  Nothing further needed at this time.

## 2015-02-23 ENCOUNTER — Telehealth: Payer: Self-pay | Admitting: Pulmonary Disease

## 2015-02-23 MED ORDER — ALBUTEROL SULFATE HFA 108 (90 BASE) MCG/ACT IN AERS: 2.0000 | INHALATION_SPRAY | Freq: Four times a day (QID) | RESPIRATORY_TRACT | Status: DC | PRN

## 2015-02-23 NOTE — Telephone Encounter (Signed)
Called and spoke to pt. Pt is requesting albuterol hfa refill. Rx sent to preferred pharmacy. Pt verbalized understanding and denied any further questions or concerns at this time.

## 2015-05-11 ENCOUNTER — Encounter: Payer: Self-pay | Admitting: Internal Medicine

## 2015-05-11 ENCOUNTER — Ambulatory Visit (INDEPENDENT_AMBULATORY_CARE_PROVIDER_SITE_OTHER): Payer: Medicare Other | Admitting: Internal Medicine

## 2015-05-11 VITALS — BP 140/78 | HR 75 | Ht 64.0 in | Wt 183.8 lb

## 2015-05-11 DIAGNOSIS — Z23 Encounter for immunization: Secondary | ICD-10-CM | POA: Diagnosis not present

## 2015-05-11 DIAGNOSIS — E785 Hyperlipidemia, unspecified: Secondary | ICD-10-CM | POA: Diagnosis not present

## 2015-05-11 DIAGNOSIS — I10 Essential (primary) hypertension: Secondary | ICD-10-CM

## 2015-05-11 DIAGNOSIS — I5032 Chronic diastolic (congestive) heart failure: Secondary | ICD-10-CM

## 2015-05-11 LAB — CBC
HCT: 41.7 % (ref 36.0–46.0)
HEMOGLOBIN: 13.3 g/dL (ref 12.0–15.0)
MCH: 29.6 pg (ref 26.0–34.0)
MCHC: 31.9 g/dL (ref 30.0–36.0)
MCV: 92.9 fL (ref 78.0–100.0)
MPV: 10.8 fL (ref 8.6–12.4)
PLATELETS: 304 10*3/uL (ref 150–400)
RBC: 4.49 MIL/uL (ref 3.87–5.11)
RDW: 13.1 % (ref 11.5–15.5)
WBC: 5 10*3/uL (ref 4.0–10.5)

## 2015-05-11 LAB — LIPID PANEL
CHOLESTEROL: 220 mg/dL — AB (ref 125–200)
HDL: 57 mg/dL (ref >=46)
LDL Cholesterol: 145 mg/dL — ABNORMAL HIGH (ref <130)
Total CHOL/HDL Ratio: 3.9 Ratio (ref <=5.0)
Triglycerides: 92 mg/dL (ref <150)
VLDL: 18 mg/dL (ref <30)

## 2015-05-11 NOTE — Patient Instructions (Signed)
Your physician recommends that you continue on your current medications as directed. Please refer to the Current Medication list given to you today. Your physician wants you to follow-up as needed with Dr. Harrington Challenger.  You will receive a reminder letter in the mail two months in advance. If you don't receive a letter, please call our office to schedule the follow-up appointment.
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
## 2015-05-12 ENCOUNTER — Other Ambulatory Visit: Payer: Self-pay | Admitting: *Deleted

## 2015-05-12 DIAGNOSIS — E785 Hyperlipidemia, unspecified: Secondary | ICD-10-CM

## 2015-05-12 LAB — TSH: TSH: 1.563 u[IU]/mL (ref 0.350–4.500)

## 2015-05-16 ENCOUNTER — Encounter (HOSPITAL_COMMUNITY): Payer: Self-pay | Admitting: Family Medicine

## 2015-05-16 ENCOUNTER — Emergency Department (HOSPITAL_COMMUNITY)
Admission: EM | Admit: 2015-05-16 | Discharge: 2015-05-17 | Disposition: A | Discharge status: Home / Self Care | Payer: Medicare Other | Attending: Emergency Medicine | Admitting: Emergency Medicine

## 2015-05-16 DIAGNOSIS — Z87891 Personal history of nicotine dependence: Secondary | ICD-10-CM | POA: Insufficient documentation

## 2015-05-16 DIAGNOSIS — Z8669 Personal history of other diseases of the nervous system and sense organs: Secondary | ICD-10-CM | POA: Insufficient documentation

## 2015-05-16 DIAGNOSIS — Z7982 Long term (current) use of aspirin: Secondary | ICD-10-CM | POA: Diagnosis not present

## 2015-05-16 DIAGNOSIS — Z87828 Personal history of other (healed) physical injury and trauma: Secondary | ICD-10-CM | POA: Diagnosis not present

## 2015-05-16 DIAGNOSIS — I10 Essential (primary) hypertension: Secondary | ICD-10-CM | POA: Diagnosis not present

## 2015-05-16 DIAGNOSIS — M8580 Other specified disorders of bone density and structure, unspecified site: Secondary | ICD-10-CM | POA: Diagnosis not present

## 2015-05-16 DIAGNOSIS — E785 Hyperlipidemia, unspecified: Secondary | ICD-10-CM | POA: Diagnosis not present

## 2015-05-16 DIAGNOSIS — R0602 Shortness of breath: Secondary | ICD-10-CM | POA: Diagnosis present

## 2015-05-16 DIAGNOSIS — J441 Chronic obstructive pulmonary disease with (acute) exacerbation: Secondary | ICD-10-CM

## 2015-05-16 DIAGNOSIS — Z87442 Personal history of urinary calculi: Secondary | ICD-10-CM | POA: Insufficient documentation

## 2015-05-16 DIAGNOSIS — Z8719 Personal history of other diseases of the digestive system: Secondary | ICD-10-CM | POA: Insufficient documentation

## 2015-05-16 DIAGNOSIS — J3489 Other specified disorders of nose and nasal sinuses: Secondary | ICD-10-CM

## 2015-05-16 DIAGNOSIS — Z8619 Personal history of other infectious and parasitic diseases: Secondary | ICD-10-CM | POA: Diagnosis not present

## 2015-05-16 DIAGNOSIS — M1990 Unspecified osteoarthritis, unspecified site: Secondary | ICD-10-CM | POA: Insufficient documentation

## 2015-05-16 MED ORDER — ALBUTEROL SULFATE (2.5 MG/3ML) 0.083% IN NEBU
5.0000 mg | INHALATION_SOLUTION | Freq: Once | RESPIRATORY_TRACT | Status: AC
  Administered 2015-05-16: 5 mg via RESPIRATORY_TRACT
  Filled 2015-05-16: qty 6

## 2015-05-16 NOTE — ED Notes (Addendum)
Pt is complaining of shortness of breath that has been going on all day. Pt has a history of COPD but today she has increased shortness of breath. Pt had a nasal cannula in her nostrils but her oxygen tank was empty. Pt has mild labored breathing. Pt reports she has been using her ALBUTEROL INHALER and DUONEB treatments with no relief.

## 2015-05-17 ENCOUNTER — Emergency Department (HOSPITAL_COMMUNITY): Payer: Medicare Other

## 2015-05-17 LAB — BASIC METABOLIC PANEL
ANION GAP: 9 (ref 5–15)
BUN: 14 mg/dL (ref 6–20)
CALCIUM: 9 mg/dL (ref 8.9–10.3)
CO2: 30 mmol/L (ref 22–32)
Chloride: 103 mmol/L (ref 101–111)
Creatinine, Ser: 0.77 mg/dL (ref 0.44–1.00)
GFR calc Af Amer: >60 mL/min (ref >60)
GFR calc non Af Amer: >60 mL/min (ref >60)
Glucose, Bld: 120 mg/dL — ABNORMAL HIGH (ref 65–99)
Potassium: 3.7 mmol/L (ref 3.5–5.1)
Sodium: 142 mmol/L (ref 135–145)

## 2015-05-17 LAB — CBC
HEMATOCRIT: 43.5 % (ref 36.0–46.0)
Hemoglobin: 13.6 g/dL (ref 12.0–15.0)
MCH: 30 pg (ref 26.0–34.0)
MCHC: 31.3 g/dL (ref 30.0–36.0)
MCV: 96 fL (ref 78.0–100.0)
Platelets: 282 10*3/uL (ref 150–400)
RBC: 4.53 MIL/uL (ref 3.87–5.11)
RDW: 12.8 % (ref 11.5–15.5)
WBC: 7.8 10*3/uL (ref 4.0–10.5)

## 2015-05-17 LAB — TROPONIN I

## 2015-05-17 MED ORDER — LORATADINE 10 MG PO TABS: 10.0000 mg | ORAL_TABLET | Freq: Every day | ORAL | Status: DC

## 2015-05-17 MED ORDER — PREDNISONE 20 MG PO TABS
60.0000 mg | ORAL_TABLET | Freq: Once | ORAL | Status: AC
  Administered 2015-05-17: 60 mg via ORAL
  Filled 2015-05-17: qty 3

## 2015-05-17 MED ORDER — IPRATROPIUM-ALBUTEROL 0.5-2.5 (3) MG/3ML IN SOLN
3.0000 mL | RESPIRATORY_TRACT | Status: AC
  Administered 2015-05-17 (×3): 3 mL via RESPIRATORY_TRACT
  Filled 2015-05-17 (×2): qty 3

## 2015-05-17 MED ORDER — LORATADINE 10 MG PO TABS
10.0000 mg | ORAL_TABLET | Freq: Once | ORAL | Status: AC
  Administered 2015-05-17: 10 mg via ORAL
  Filled 2015-05-17: qty 1

## 2015-05-17 MED ORDER — FLUTICASONE PROPIONATE 50 MCG/ACT NA SUSP: 1.0000 | Freq: Every day | NASAL | Status: DC

## 2015-05-17 MED ORDER — PREDNISONE 20 MG PO TABS: 60.0000 mg | ORAL_TABLET | Freq: Every day | ORAL | Status: DC

## 2015-05-17 NOTE — ED Notes (Signed)
Patient transported to X-ray 
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
## 2015-05-17 NOTE — ED Notes (Signed)
Below order not completed by EW. 

## 2015-05-17 NOTE — Discharge Instructions (Signed)
Chronic Obstructive Pulmonary Disease Chronic obstructive pulmonary disease (COPD) is a common lung condition in which airflow from the lungs is limited. COPD is a general term that can be used to describe many different lung problems that limit airflow, including both chronic bronchitis and emphysema. If you have COPD, your lung function will probably never return to normal, but there are measures you can take to improve lung function and make yourself feel better. CAUSES   Smoking (common).  Exposure to secondhand smoke.  Genetic problems.  Chronic inflammatory lung diseases or recurrent infections. SYMPTOMS  Shortness of breath, especially with physical activity.  Deep, persistent (chronic) cough with a large amount of thick mucus.  Wheezing.  Rapid breaths (tachypnea).  Gray or bluish discoloration (cyanosis) of the skin, especially in your fingers, toes, or lips.  Fatigue.  Weight loss.  Frequent infections or episodes when breathing symptoms become much worse (exacerbations).  Chest tightness. DIAGNOSIS Your health care provider will take a medical history and perform a physical examination to diagnose COPD. Additional tests for COPD may include:  Lung (pulmonary) function tests.  Chest X-ray.  CT scan.  Blood tests. TREATMENT  Treatment for COPD may include:  Inhaler and nebulizer medicines. These help manage the symptoms of COPD and make your breathing more comfortable.  Supplemental oxygen. Supplemental oxygen is only helpful if you have a low oxygen level in your blood.  Exercise and physical activity. These are beneficial for nearly all people with COPD.  Lung surgery or transplant.  Nutrition therapy to gain weight, if you are underweight.  Pulmonary rehabilitation. This may involve working with a team of health care providers and specialists, such as respiratory, occupational, and physical therapists. HOME CARE INSTRUCTIONS  Take all medicines  (inhaled or pills) as directed by your health care provider.  Avoid over-the-counter medicines or cough syrups that dry up your airway (such as antihistamines) and slow down the elimination of secretions unless instructed otherwise by your health care provider.  If you are a smoker, the most important thing that you can do is stop smoking. Continuing to smoke will cause further lung damage and breathing trouble. Ask your health care provider for help with quitting smoking. He or she can direct you to community resources or hospitals that provide support.  Avoid exposure to irritants such as smoke, chemicals, and fumes that aggravate your breathing.  Use oxygen therapy and pulmonary rehabilitation if directed by your health care provider. If you require home oxygen therapy, ask your health care provider whether you should purchase a pulse oximeter to measure your oxygen level at home.  Avoid contact with individuals who have a contagious illness.  Avoid extreme temperature and humidity changes.  Eat healthy foods. Eating smaller, more frequent meals and resting before meals may help you maintain your strength.  Stay active, but balance activity with periods of rest. Exercise and physical activity will help you maintain your ability to do things you want to do.  Preventing infection and hospitalization is very important when you have COPD. Make sure to receive all the vaccines your health care provider recommends, especially the pneumococcal and influenza vaccines. Ask your health care provider whether you need a pneumonia vaccine.  Learn and use relaxation techniques to manage stress.  Learn and use controlled breathing techniques as directed by your health care provider. Controlled breathing techniques include:  Pursed lip breathing. Start by breathing in (inhaling) through your nose for 1 second. Then, purse your lips as if you were  going to whistle and breathe out (exhale) through the  pursed lips for 2 seconds.  Diaphragmatic breathing. Start by putting one hand on your abdomen just above your waist. Inhale slowly through your nose. The hand on your abdomen should move out. Then purse your lips and exhale slowly. You should be able to feel the hand on your abdomen moving in as you exhale.  Learn and use controlled coughing to clear mucus from your lungs. Controlled coughing is a series of short, progressive coughs. The steps of controlled coughing are: 1. Lean your head slightly forward. 2. Breathe in deeply using diaphragmatic breathing. 3. Try to hold your breath for 3 seconds. 4. Keep your mouth slightly open while coughing twice. 5. Spit any mucus out into a tissue. 6. Rest and repeat the steps once or twice as needed. SEEK MEDICAL CARE IF:  You are coughing up more mucus than usual.  There is a change in the color or thickness of your mucus.  Your breathing is more labored than usual.  Your breathing is faster than usual. SEEK IMMEDIATE MEDICAL CARE IF:  You have shortness of breath while you are resting.  You have shortness of breath that prevents you from:  Being able to talk.  Performing your usual physical activities.  You have chest pain lasting longer than 5 minutes.  Your skin color is more cyanotic than usual.  You measure low oxygen saturations for longer than 5 minutes with a pulse oximeter. MAKE SURE YOU:  Understand these instructions.  Will watch your condition.  Will get help right away if you are not doing well or get worse.   This information is not intended to replace advice given to you by your health care provider. Make sure you discuss any questions you have with your health care provider.   Document Released: 03/06/2005 Document Revised: 06/17/2014 Document Reviewed: 01/21/2013 Elsevier Interactive Patient Education 2016 Elsevier Inc[employer]  Upper Respiratory Infection, Adult Most upper respiratory infections (URIs) are caused  by a virus. A URI affects the nose, throat, and upper air passages. The most common type of URI is often called "the common cold." HOME CARE   Take medicines only as told by your doctor.  Gargle warm saltwater or take cough drops to comfort your throat as told by your doctor.  Use a warm mist humidifier or inhale steam from a shower to increase air moisture. This may make it easier to breathe.  Drink enough fluid to keep your pee (urine) clear or pale yellow.  Eat soups and other clear broths.  Have a healthy diet.  Rest as needed.  Go back to work when your fever is gone or your doctor says it is okay.  You may need to stay home longer to avoid giving your URI to others.  You can also wear a face mask and wash your hands often to prevent spread of the virus.  Use your inhaler more if you have asthma.  Do not use any tobacco products, including cigarettes, chewing tobacco, or electronic cigarettes. If you need help quitting, ask your doctor. GET HELP IF:  You are getting worse, not better.  Your symptoms are not helped by medicine.  You have chills.  You are getting more short of breath.  You have brown or red mucus.  You have yellow or brown discharge from your nose.  You have pain in your face, especially when you bend forward.  You have a fever.  You have puffy (swollen)  neck glands.  You have pain while swallowing.  You have white areas in the back of your throat. GET HELP RIGHT AWAY IF:   You have very bad or constant:  Headache.  Ear pain.  Pain in your forehead, behind your eyes, and over your cheekbones (sinus pain).  Chest pain.  You have long-lasting (chronic) lung disease and any of the following:  Wheezing.  Long-lasting cough.  Coughing up blood.  A change in your usual mucus.  You have a stiff neck.  You have changes in your:  Vision.  Hearing.  Thinking.  Mood. MAKE SURE YOU:   Understand these instructions.  Will  watch your condition.  Will get help right away if you are not doing well or get worse.   This information is not intended to replace advice given to you by your health care provider. Make sure you discuss any questions you have with your health care provider.   Document Released: 11/13/2007 Document Revised: 10/11/2014 Document Reviewed: 09/01/2013 Elsevier Interactive Patient Education [employer].

## 2015-05-17 NOTE — ED Notes (Signed)
Pt says she called her son who informed her he was having trouble filling the oxygen tank and would try to make it back to ED by 7am

## 2015-05-17 NOTE — ED Notes (Signed)
PA at bedside.

## 2015-05-17 NOTE — ED Notes (Signed)
Per MD request; pt son went to get full oxygen tank ; pt otherwise ready for discharge

## 2015-05-17 NOTE — ED Notes (Signed)
Bed: WA10 Expected date:  Expected time:  Means of arrival:  Comments: Triage 3  

## 2015-05-23 ENCOUNTER — Ambulatory Visit (INDEPENDENT_AMBULATORY_CARE_PROVIDER_SITE_OTHER): Payer: Medicare Other | Admitting: Pulmonary Disease

## 2015-05-23 ENCOUNTER — Other Ambulatory Visit: Payer: Medicare Other

## 2015-05-23 ENCOUNTER — Encounter: Payer: Self-pay | Admitting: Pulmonary Disease

## 2015-05-23 ENCOUNTER — Telehealth: Payer: Self-pay | Admitting: Pulmonary Disease

## 2015-05-23 VITALS — BP 120/66 | HR 70 | Ht 65.0 in | Wt 182.2 lb

## 2015-05-23 DIAGNOSIS — K219 Gastro-esophageal reflux disease without esophagitis: Secondary | ICD-10-CM

## 2015-05-23 DIAGNOSIS — J449 Chronic obstructive pulmonary disease, unspecified: Secondary | ICD-10-CM

## 2015-05-23 DIAGNOSIS — J9611 Chronic respiratory failure with hypoxia: Secondary | ICD-10-CM | POA: Diagnosis not present

## 2015-05-23 MED ORDER — IPRATROPIUM-ALBUTEROL 0.5-2.5 (3) MG/3ML IN SOLN: 3.0000 mL | Freq: Four times a day (QID) | RESPIRATORY_TRACT | Status: DC

## 2015-05-23 MED ORDER — BUDESONIDE 0.25 MG/2ML IN SUSP: 0.2500 mg | Freq: Two times a day (BID) | RESPIRATORY_TRACT | Status: DC
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
## 2015-05-23 NOTE — Patient Instructions (Addendum)
1. I'm starting you on Duoneb (Albuterol with Ipratroprium Bromide) to use in your nebulizer every 6 hours on a daily basis. 2. Continue using your albuterol inhaler & albuterol in your nebulizer on an as needed basis every 4 hours. 3. I'm starting you on Budesonide in your nebulizer twice daily. Remember to rinse, gargle, and spit after you inhale so you don't get thrush. 4. We will do breathing tests at your next appointment. 5. Call me if you have any new breathing problems before your next appointment otherwise I will see you again in 3 months.

## 2015-05-23 NOTE — Telephone Encounter (Signed)
IMAGING CXR PA/LAT 05/17/15 (personally reviewed by me):  Borderline cardiomegaly. Rotated to the right. No appreciated opacity or pleural effusion.  CARDIAC TTE (06/08/13):  LV normal in size w/ EF 55-60%. Normal wall motion. Grade 1 diastolic dysfunction. LA & RA normal in size. Pulmonary artery normal in size. No aortic stenosis or regurg. No mitral regurg. Trivial pulmonic regurg. Trivial tricuspid regurg. No pericardial effusion.  LABS 05/17/15 BMP:  142/3.7/103/30/14/0.77/120/9.0 CBC:  7.8/13.6/43.5/282  06/08/13 ABG:  7.33 / 54 / 84 / Sat 95%

## 2015-05-29 LAB — ALPHA-1 ANTITRYPSIN PHENOTYPE: A1 ANTITRYPSIN: 136 mg/dL (ref 83–199)

## 2015-06-23 ENCOUNTER — Ambulatory Visit: Payer: Medicare Other

## 2015-06-26 ENCOUNTER — Ambulatory Visit: Payer: Medicare Other

## 2015-07-03 ENCOUNTER — Other Ambulatory Visit: Payer: Self-pay | Admitting: Internal Medicine

## 2015-07-11 ENCOUNTER — Other Ambulatory Visit (INDEPENDENT_AMBULATORY_CARE_PROVIDER_SITE_OTHER): Payer: Medicare Other

## 2015-07-11 DIAGNOSIS — E785 Hyperlipidemia, unspecified: Secondary | ICD-10-CM | POA: Diagnosis not present

## 2015-07-11 LAB — AST: AST: 16 U/L (ref 10–35)

## 2015-07-11 LAB — LIPID PANEL
CHOLESTEROL: 192 mg/dL (ref 125–200)
HDL: 62 mg/dL (ref >=46)
LDL Cholesterol: 114 mg/dL (ref <130)
TRIGLYCERIDES: 80 mg/dL (ref <150)
Total CHOL/HDL Ratio: 3.1 Ratio (ref <=5.0)
VLDL: 16 mg/dL (ref <30)

## 2015-07-12 ENCOUNTER — Telehealth: Payer: Self-pay | Admitting: Internal Medicine

## 2015-07-12 NOTE — Telephone Encounter (Signed)
Please call her again,she says it is important.

## 2015-07-12 NOTE — Telephone Encounter (Signed)
Spoke with patient and she stated that the pharmacist informed her that the rx was approved already by her insurance and that the medication was ready for pick up.

## 2015-07-12 NOTE — Telephone Encounter (Signed)
Spoke with patient and she will call to see if her insurance is requiring a prior authorization. If so, she will request that they fax [name] a form. She will call back to let us know what they tell her.

## 2015-07-12 NOTE — Telephone Encounter (Signed)
Pt says her insurance will no longer pay for her Zetia. Is there something else she can take? She took her last pill yesterday?

## 2015-07-26 ENCOUNTER — Other Ambulatory Visit: Payer: Self-pay | Admitting: Adult Health

## 2015-07-27 ENCOUNTER — Telehealth: Payer: Self-pay | Admitting: Adult Health

## 2015-07-27 MED ORDER — ALBUTEROL SULFATE HFA 108 (90 BASE) MCG/ACT IN AERS: 2.0000 | INHALATION_SPRAY | Freq: Four times a day (QID) | RESPIRATORY_TRACT | Status: DC | PRN

## 2015-07-27 NOTE — Telephone Encounter (Signed)
Called spoke with pt. She needed refill on proair. I have refilled this for pt. Nothing further needed

## 2015-08-14 ENCOUNTER — Ambulatory Visit (INDEPENDENT_AMBULATORY_CARE_PROVIDER_SITE_OTHER): Payer: Medicare Other | Admitting: Pulmonary Disease

## 2015-08-14 ENCOUNTER — Encounter: Payer: Self-pay | Admitting: Pulmonary Disease

## 2015-08-14 VITALS — BP 126/60 | HR 71 | Ht 65.0 in | Wt 182.0 lb

## 2015-08-14 DIAGNOSIS — J9611 Chronic respiratory failure with hypoxia: Secondary | ICD-10-CM

## 2015-08-14 DIAGNOSIS — M79622 Pain in left upper arm: Secondary | ICD-10-CM

## 2015-08-14 DIAGNOSIS — Z23 Encounter for immunization: Secondary | ICD-10-CM | POA: Diagnosis not present

## 2015-08-14 DIAGNOSIS — J449 Chronic obstructive pulmonary disease, unspecified: Secondary | ICD-10-CM

## 2015-08-14 DIAGNOSIS — M25522 Pain in left elbow: Secondary | ICD-10-CM

## 2015-08-14 LAB — PULMONARY FUNCTION TEST
DL/VA % pred: 47 %
DL/VA: 2.32 ml/min/mmHg/L
DLCO COR % PRED: 24 %
DLCO COR: 6.37 ml/min/mmHg
DLCO UNC % PRED: 25 %
DLCO UNC: 6.42 ml/min/mmHg
FEF 25-75 Post: 0.31 L/sec
FEF 25-75 Pre: 0.25 L/sec
FEF2575-%Change-Post: 25 %
FEF2575-%PRED-PRE: 18 %
FEF2575-%Pred-Post: 23 %
FEV1-%CHANGE-POST: 9 %
FEV1-%PRED-POST: 42 %
FEV1-%Pred-Pre: 38 %
FEV1-POST: 0.68 L
FEV1-Pre: 0.62 L
FEV1FVC-%Change-Post: 2 %
FEV1FVC-%PRED-PRE: 66 %
FEV6-%Change-Post: 7 %
FEV6-%Pred-Post: 66 %
FEV6-%Pred-Pre: 61 %
FEV6-POST: 1.31 L
FEV6-Pre: 1.22 L
FEV6FVC-%Change-Post: 0 %
FEV6FVC-%PRED-POST: 102 %
FEV6FVC-%Pred-Pre: 102 %
FVC-%Change-Post: 7 %
FVC-%PRED-POST: 64 %
FVC-%PRED-PRE: 60 %
FVC-POST: 1.33 L
FVC-PRE: 1.24 L
POST FEV1/FVC RATIO: 51 %
PRE FEV1/FVC RATIO: 50 %
Post FEV6/FVC ratio: 98 %
Pre FEV6/FVC Ratio: 98 %

## 2015-08-14 MED ORDER — IPRATROPIUM-ALBUTEROL 0.5-2.5 (3) MG/3ML IN SOLN: 3.0000 mL | Freq: Four times a day (QID) | RESPIRATORY_TRACT | Status: DC

## 2015-08-14 MED ORDER — BUDESONIDE 0.25 MG/2ML IN SUSP: 0.2500 mg | Freq: Two times a day (BID) | RESPIRATORY_TRACT | Status: DC

## 2015-08-14 MED ORDER — ALBUTEROL SULFATE (2.5 MG/3ML) 0.083% IN NEBU: 2.5000 mg | INHALATION_SOLUTION | RESPIRATORY_TRACT | Status: DC | PRN

## 2015-08-14 NOTE — Patient Instructions (Signed)
   Please get checked out if your arm pain continues.  Continue using your Budesonide/Pulmicort twice daily as prescribed.  Continue using your Duoneb 4 times a day as prescribed.  I am refilling the albuterol to use in your nebulizer on an as needed basis.  Call me if you have any new breathing problems before your next appointment.  I will see you back in 6 months or sooner if needed.
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
## 2015-08-14 NOTE — Progress Notes (Signed)
PFT done today. 

## 2015-09-28 ENCOUNTER — Other Ambulatory Visit: Payer: Self-pay | Admitting: Internal Medicine

## 2015-09-29 ENCOUNTER — Telehealth: Payer: Self-pay | Admitting: Internal Medicine

## 2015-09-29 NOTE — Telephone Encounter (Signed)
°*  STAT* If patient is at the pharmacy, call can be transferred to refill team.   1. Which medications need to be refilled? (please list name of each medication and dose if known) Zetia  2. Which pharmacy/location (including street and city if local pharmacy) is medication to be sent to?[facility] [address]   3. Do they need a 30 day or 90 day supply? 90  Please call [name] once it has been sent to the Pharmacy

## 2015-09-29 NOTE — Telephone Encounter (Signed)
Spoke with patient and made her aware that this was sent in yesterday.

## 2015-12-01 ENCOUNTER — Telehealth: Payer: Self-pay | Admitting: Pulmonary Disease

## 2015-12-01 NOTE — Telephone Encounter (Signed)
Called spoke with pt. She states she tried to receive a refill on her duoneb and albuterol at [facility] on [address]. I explained to her that I would call the pharmacy and she voiced understanding and had no further questions.   Called spoke with the pharmacy tech. She states that the medication was previously done under medicare part D but is having to be run under medicare part B now. She stated she need information on whether the patient was renting her neb machine or owned it. I explained that she received it from [facility]. She voiced understanding and had no questions. She states that the Duoneb went through but the proair will not go through until 12/07/15.  Called spoke with pt. Informed her of the above. She voiced understanding and had no further questions.

## 2015-12-26 ENCOUNTER — Telehealth: Payer: Self-pay | Admitting: Pulmonary Disease

## 2015-12-26 ENCOUNTER — Other Ambulatory Visit: Payer: Self-pay | Admitting: Pulmonary Disease

## 2015-12-26 NOTE — Telephone Encounter (Signed)
Spoke with pt. She is needing a refill on ProAir. Advised her that we sent this prescription in on 07/27/15 with 6 additional refills. Pt will check with her pharmacy about this. Nothing further was needed.

## 2016-02-19 ENCOUNTER — Encounter: Payer: Self-pay | Admitting: Pulmonary Disease

## 2016-02-19 ENCOUNTER — Ambulatory Visit (INDEPENDENT_AMBULATORY_CARE_PROVIDER_SITE_OTHER): Payer: Medicare Other | Admitting: Pulmonary Disease

## 2016-02-19 VITALS — BP 138/78 | HR 72 | Wt 187.4 lb

## 2016-02-19 DIAGNOSIS — J449 Chronic obstructive pulmonary disease, unspecified: Secondary | ICD-10-CM

## 2016-02-19 DIAGNOSIS — J9611 Chronic respiratory failure with hypoxia: Secondary | ICD-10-CM | POA: Diagnosis not present

## 2016-02-19 DIAGNOSIS — Z23 Encounter for immunization: Secondary | ICD-10-CM

## 2016-02-19 DIAGNOSIS — K219 Gastro-esophageal reflux disease without esophagitis: Secondary | ICD-10-CM | POA: Diagnosis not present

## 2016-02-19 MED ORDER — BUDESONIDE 0.25 MG/2ML IN SUSP: 0.2500 mg | Freq: Two times a day (BID) | RESPIRATORY_TRACT | 11 refills | Status: DC

## 2016-02-19 MED ORDER — IPRATROPIUM-ALBUTEROL 0.5-2.5 (3) MG/3ML IN SOLN: 3.0000 mL | Freq: Four times a day (QID) | RESPIRATORY_TRACT | 11 refills | Status: DC

## 2016-02-19 NOTE — Patient Instructions (Addendum)
   Continue using your nebulizer and rescue medications as prescribed.   Your don't need any further pneumonia vaccines.  We gave you your flu vaccine today.  Call me if you have any new breathing problems before your next appointment.  I will see you back in 6 months or sooner if needed.
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
## 2016-05-15 ENCOUNTER — Other Ambulatory Visit: Payer: Self-pay

## 2016-05-15 ENCOUNTER — Telehealth: Payer: Self-pay | Admitting: Pulmonary Disease

## 2016-05-15 MED ORDER — ALBUTEROL SULFATE HFA 108 (90 BASE) MCG/ACT IN AERS: INHALATION_SPRAY | RESPIRATORY_TRACT | 5 refills | Status: DC

## 2016-05-15 NOTE — Telephone Encounter (Signed)
Spoke with pt. And refill was sent to her pharmacy of choice. Nothing further is needed at this.

## 2016-07-01 ENCOUNTER — Encounter: Payer: Self-pay | Admitting: Internal Medicine

## 2016-07-01 ENCOUNTER — Ambulatory Visit (INDEPENDENT_AMBULATORY_CARE_PROVIDER_SITE_OTHER): Payer: Medicare Other | Admitting: Internal Medicine

## 2016-07-01 VITALS — BP 148/71 | HR 86 | Ht 65.0 in | Wt 191.4 lb

## 2016-07-01 DIAGNOSIS — I10 Essential (primary) hypertension: Secondary | ICD-10-CM

## 2016-07-01 DIAGNOSIS — I5032 Chronic diastolic (congestive) heart failure: Secondary | ICD-10-CM | POA: Diagnosis not present

## 2016-07-01 DIAGNOSIS — E785 Hyperlipidemia, unspecified: Secondary | ICD-10-CM | POA: Diagnosis not present
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
## 2016-07-01 NOTE — Patient Instructions (Signed)
Your physician recommends that you continue on your current medications as directed. Please refer to the Current Medication list given to you today.  Your physician recommends that you return for lab work (BMET, CBC, LIPIDS, TSH)  Your physician wants you to follow-up in: [place].  You will receive a reminder letter in the mail two months in advance. If you don't receive a letter, please call our office to schedule the follow-up appointment.

## 2016-07-02 LAB — BASIC METABOLIC PANEL WITH GFR
BUN/Creatinine Ratio: 11 — ABNORMAL LOW (ref 12–28)
BUN: 9 mg/dL (ref 8–27)
CO2: 28 mmol/L (ref 18–29)
Calcium: 9 mg/dL (ref 8.7–10.3)
Chloride: 102 mmol/L (ref 96–106)
Creatinine, Ser: 0.79 mg/dL (ref 0.57–1.00)
GFR calc Af Amer: 81 mL/min/{1.73_m2}
GFR calc non Af Amer: 70 mL/min/{1.73_m2}
Glucose: 91 mg/dL (ref 65–99)
Potassium: 3.7 mmol/L (ref 3.5–5.2)
Sodium: 145 mmol/L — ABNORMAL HIGH (ref 134–144)

## 2016-07-02 LAB — LIPID PANEL
CHOL/HDL RATIO: 3.4 ratio (ref 0.0–4.4)
Cholesterol, Total: 209 mg/dL — ABNORMAL HIGH (ref 100–199)
HDL: 61 mg/dL (ref >39)
LDL Calculated: 131 mg/dL — ABNORMAL HIGH (ref 0–99)
TRIGLYCERIDES: 83 mg/dL (ref 0–149)
VLDL Cholesterol Cal: 17 mg/dL (ref 5–40)

## 2016-07-02 LAB — TSH: TSH: 2.13 u[IU]/mL (ref 0.450–4.500)

## 2016-07-02 LAB — CBC
Hematocrit: 41.8 % (ref 34.0–46.6)
Hemoglobin: 13.4 g/dL (ref 11.1–15.9)
MCH: 30.8 pg (ref 26.6–33.0)
MCHC: 32.1 g/dL (ref 31.5–35.7)
MCV: 96 fL (ref 79–97)
Platelets: 306 10*3/uL (ref 150–379)
RBC: 4.35 x10E6/uL (ref 3.77–5.28)
RDW: 13.3 % (ref 12.3–15.4)
WBC: 5.7 10*3/uL (ref 3.4–10.8)

## 2016-08-12 ENCOUNTER — Encounter: Payer: Self-pay | Admitting: Pulmonary Disease

## 2016-08-12 ENCOUNTER — Ambulatory Visit (INDEPENDENT_AMBULATORY_CARE_PROVIDER_SITE_OTHER): Payer: Medicare Other | Admitting: Pulmonary Disease

## 2016-08-12 VITALS — BP 146/82 | HR 76 | Ht 65.0 in | Wt 176.0 lb

## 2016-08-12 DIAGNOSIS — J449 Chronic obstructive pulmonary disease, unspecified: Secondary | ICD-10-CM

## 2016-08-12 DIAGNOSIS — J9611 Chronic respiratory failure with hypoxia: Secondary | ICD-10-CM | POA: Diagnosis not present

## 2016-08-12 DIAGNOSIS — K219 Gastro-esophageal reflux disease without esophagitis: Secondary | ICD-10-CM

## 2016-08-12 NOTE — Patient Instructions (Addendum)
   Continue using your inhaler and nebulizer medications as prescribed.  If you notice a sour or bitter taste in your mouth in the morning this could be your reflux and you may need to go back on medication.  Check with you oxygen company but I believe you may be able to shower with your oxygen on.   Think about doing pulmonary rehab at [facility]. It may help you out significantly with your endurance.
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
## 2016-08-21 ENCOUNTER — Other Ambulatory Visit: Payer: Self-pay | Admitting: Internal Medicine

## 2016-09-27 ENCOUNTER — Telehealth: Payer: Self-pay | Admitting: Pulmonary Disease

## 2016-09-27 MED ORDER — ALBUTEROL SULFATE HFA 108 (90 BASE) MCG/ACT IN AERS: INHALATION_SPRAY | RESPIRATORY_TRACT | 5 refills | Status: DC

## 2016-09-27 NOTE — Telephone Encounter (Signed)
Rescue inhaler sent to preferred pharmacy.  Pt aware.  Nothing further needed.

## 2017-01-23 ENCOUNTER — Telehealth: Payer: Self-pay | Admitting: Pulmonary Disease

## 2017-01-23 MED ORDER — ALBUTEROL SULFATE HFA 108 (90 BASE) MCG/ACT IN AERS: INHALATION_SPRAY | RESPIRATORY_TRACT | 5 refills | Status: DC

## 2017-01-23 NOTE — Telephone Encounter (Signed)
Spoke with pt. She is needing a refill on Proair. Rx has been sent in. Nothing further was needed. 

## 2017-03-04 ENCOUNTER — Ambulatory Visit: Payer: Medicare Other | Admitting: Pulmonary Disease

## 2017-03-20 ENCOUNTER — Ambulatory Visit: Payer: Medicare Other | Admitting: Pulmonary Disease

## 2017-03-22 ENCOUNTER — Other Ambulatory Visit: Payer: Self-pay | Admitting: Pulmonary Disease

## 2017-03-26 ENCOUNTER — Ambulatory Visit (INDEPENDENT_AMBULATORY_CARE_PROVIDER_SITE_OTHER)
Admission: RE | Admit: 2017-03-26 | Discharge: 2017-03-26 | Disposition: A | Discharge status: Home / Self Care | Payer: Medicare Other | Source: Ambulatory Visit | Attending: Pulmonary Disease | Admitting: Pulmonary Disease

## 2017-03-26 ENCOUNTER — Ambulatory Visit (INDEPENDENT_AMBULATORY_CARE_PROVIDER_SITE_OTHER): Payer: Medicare Other | Admitting: Pulmonary Disease

## 2017-03-26 VITALS — BP 134/74 | HR 84 | Ht 65.0 in | Wt 185.0 lb

## 2017-03-26 DIAGNOSIS — K219 Gastro-esophageal reflux disease without esophagitis: Secondary | ICD-10-CM

## 2017-03-26 DIAGNOSIS — J9611 Chronic respiratory failure with hypoxia: Secondary | ICD-10-CM

## 2017-03-26 DIAGNOSIS — J441 Chronic obstructive pulmonary disease with (acute) exacerbation: Secondary | ICD-10-CM | POA: Diagnosis not present

## 2017-03-26 MED ORDER — BUDESONIDE 0.5 MG/2ML IN SUSP: 0.5000 mg | Freq: Two times a day (BID) | RESPIRATORY_TRACT | 6 refills | Status: DC

## 2017-03-26 MED ORDER — PREDNISONE 20 MG PO TABS: 40.0000 mg | ORAL_TABLET | Freq: Every day | ORAL | 0 refills | Status: DC

## 2017-03-26 MED ORDER — IPRATROPIUM-ALBUTEROL 0.5-2.5 (3) MG/3ML IN SOLN: 3.0000 mL | Freq: Four times a day (QID) | RESPIRATORY_TRACT | 6 refills | Status: DC
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
## 2017-03-26 NOTE — Patient Instructions (Addendum)
   Continue using your Albuterol emergency inhaler and Albuterol in your inhaler as needed for any coughing, wheezing or shortness of breath.  We are restarting your Pulmicort (also called Budesonide) in your nebulizer twice daily. Remember to remove any dentures or partials you have before you use this. Remember to brush your teeth & tongue after you use it as well as rinse, gargle & spit to keep from getting thrush in your mouth or on your tongue (a white film).   We are restarting your Duoneb in your nebulizer 4 times daily. This has both Albuterol and Atrovent (also called Ipratroprium Bromide) in it.  Call my office if you have any new breathing problems or questions before your next appointment.  I will see you back in 1 week. If you feel significantly better by then you can reschedule for a couple of months down the road.  TESTS ORDERED: 1. CXR PA/LAT TODAY

## 2017-03-31 ENCOUNTER — Telehealth: Payer: Self-pay | Admitting: Pulmonary Disease

## 2017-03-31 MED ORDER — IPRATROPIUM-ALBUTEROL 0.5-2.5 (3) MG/3ML IN SOLN: 3.0000 mL | Freq: Four times a day (QID) | RESPIRATORY_TRACT | 6 refills | Status: DC

## 2017-03-31 MED ORDER — BUDESONIDE 0.5 MG/2ML IN SUSP: 0.5000 mg | Freq: Two times a day (BID) | RESPIRATORY_TRACT | 6 refills | Status: DC

## 2017-03-31 MED ORDER — ALBUTEROL SULFATE (2.5 MG/3ML) 0.083% IN NEBU
2.5000 mg | INHALATION_SOLUTION | RESPIRATORY_TRACT | 3 refills | Status: DC | PRN
Start: 2017-03-31 — End: 2017-04-04

## 2017-03-31 NOTE — Telephone Encounter (Signed)
Spoke with pt and advised rx sent to pharmacy. Nothing further is needed.   

## 2017-04-02 ENCOUNTER — Telehealth: Payer: Self-pay | Admitting: Pulmonary Disease

## 2017-04-02 ENCOUNTER — Ambulatory Visit: Payer: Medicare Other | Admitting: Pulmonary Disease

## 2017-04-02 MED ORDER — BUDESONIDE 0.5 MG/2ML IN SUSP: 0.5000 mg | Freq: Two times a day (BID) | RESPIRATORY_TRACT | 6 refills | Status: DC

## 2017-04-02 MED ORDER — IPRATROPIUM-ALBUTEROL 0.5-2.5 (3) MG/3ML IN SOLN: 3.0000 mL | Freq: Four times a day (QID) | RESPIRATORY_TRACT | 6 refills | Status: DC

## 2017-04-02 NOTE — Telephone Encounter (Signed)
Called and spoke with pt and she is aware of rx that have been sent to the pharmacy.

## 2017-04-04 ENCOUNTER — Encounter: Payer: Self-pay | Admitting: Pulmonary Disease

## 2017-04-04 ENCOUNTER — Telehealth: Payer: Self-pay | Admitting: Pulmonary Disease

## 2017-04-04 MED ORDER — ALBUTEROL SULFATE (2.5 MG/3ML) 0.083% IN NEBU: 2.5000 mg | INHALATION_SOLUTION | RESPIRATORY_TRACT | 3 refills | Status: DC | PRN

## 2017-04-04 NOTE — Telephone Encounter (Signed)
Rec'd fax from the [facility] that the meds for budesonide neb for twice daily is needing a code for insurance to be sent.  The code provided to them was COPD Gold C J44.9 faxed over to them today. Confirmation was rec'd. Nothing further needed at this time.

## 2017-05-09 ENCOUNTER — Telehealth: Payer: Self-pay | Admitting: Pulmonary Disease

## 2017-05-09 MED ORDER — IPRATROPIUM-ALBUTEROL 0.5-2.5 (3) MG/3ML IN SOLN: 3.0000 mL | Freq: Four times a day (QID) | RESPIRATORY_TRACT | 6 refills | Status: DC

## 2017-05-09 MED ORDER — BUDESONIDE 0.5 MG/2ML IN SUSP: 0.5000 mg | Freq: Two times a day (BID) | RESPIRATORY_TRACT | 6 refills | Status: AC

## 2017-05-09 NOTE — Telephone Encounter (Signed)
Called pt letting her know I was sending in meds to preferred pharmacy [facility] choice [facility] on [address].  Pt expressed understanding. Nothing further needed.

## 2017-05-09 NOTE — Telephone Encounter (Signed)
Patient returned call - she would like [name] to [facility] [address] - pt can be reached at [phone] if necessary -pr

## 2017-05-09 NOTE — Telephone Encounter (Signed)
Called and spoke with pt asking if she wanted me to send the Rx through [facility] instead of trying to go through [facility] since they were giving her a hard time.   Pt stated to me [facility] but could not [facility].   Pt is going to call us back to let us know which [facility] to [facility] a refill of the ipratropium and pulmicort neb sol to.

## 2017-05-23 ENCOUNTER — Telehealth: Payer: Self-pay | Admitting: Adult Health

## 2017-05-23 MED ORDER — ALBUTEROL SULFATE HFA 108 (90 BASE) MCG/ACT IN AERS: INHALATION_SPRAY | RESPIRATORY_TRACT | 5 refills | Status: DC

## 2017-05-23 NOTE — Telephone Encounter (Signed)
Pt requesting refill on proair.  This has been sent to preferred pharmacy.  Pt aware.  Nothing further needed.

## 2017-07-21 ENCOUNTER — Encounter (INDEPENDENT_AMBULATORY_CARE_PROVIDER_SITE_OTHER): Payer: Self-pay

## 2017-07-21 ENCOUNTER — Encounter: Payer: Self-pay | Admitting: Internal Medicine

## 2017-07-21 ENCOUNTER — Ambulatory Visit: Payer: Medicare Other | Admitting: Internal Medicine

## 2017-07-21 VITALS — BP 142/80 | HR 78 | Ht 65.0 in | Wt 186.8 lb

## 2017-07-21 DIAGNOSIS — I10 Essential (primary) hypertension: Secondary | ICD-10-CM | POA: Diagnosis not present

## 2017-07-21 DIAGNOSIS — R0789 Other chest pain: Secondary | ICD-10-CM

## 2017-07-21 DIAGNOSIS — I471 Supraventricular tachycardia, unspecified: Secondary | ICD-10-CM

## 2017-07-21 DIAGNOSIS — I5032 Chronic diastolic (congestive) heart failure: Secondary | ICD-10-CM | POA: Diagnosis not present

## 2017-07-21 DIAGNOSIS — J449 Chronic obstructive pulmonary disease, unspecified: Secondary | ICD-10-CM

## 2017-07-21 DIAGNOSIS — E785 Hyperlipidemia, unspecified: Secondary | ICD-10-CM | POA: Diagnosis not present

## 2017-07-21 NOTE — Patient Instructions (Signed)
Your physician recommends that you continue on your current medications as directed. Please refer to the Current Medication list given to you today.  Your physician recommends that you return for lab work today ([place]).  Need follow up appointment in Pulmonary [facility].
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
## 2017-07-22 LAB — CBC
Hematocrit: 43.7 % (ref 34.0–46.6)
Hemoglobin: 14.5 g/dL (ref 11.1–15.9)
MCH: 31.4 pg (ref 26.6–33.0)
MCHC: 33.2 g/dL (ref 31.5–35.7)
MCV: 95 fL (ref 79–97)
Platelets: 255 10*3/uL (ref 150–379)
RBC: 4.62 x10E6/uL (ref 3.77–5.28)
RDW: 13.6 % (ref 12.3–15.4)
WBC: 4.3 10*3/uL (ref 3.4–10.8)

## 2017-07-22 LAB — LIPID PANEL
CHOLESTEROL TOTAL: 230 mg/dL — AB (ref 100–199)
Chol/HDL Ratio: 3.4 ratio (ref 0.0–4.4)
HDL: 68 mg/dL (ref >39)
LDL CALC: 146 mg/dL — AB (ref 0–99)
TRIGLYCERIDES: 82 mg/dL (ref 0–149)
VLDL Cholesterol Cal: 16 mg/dL (ref 5–40)

## 2017-07-25 ENCOUNTER — Telehealth: Payer: Self-pay

## 2017-07-25 DIAGNOSIS — E785 Hyperlipidemia, unspecified: Secondary | ICD-10-CM

## 2017-07-25 MED ORDER — ROSUVASTATIN CALCIUM 20 MG PO TABS: 20.0000 mg | ORAL_TABLET | Freq: Every day | ORAL | 3 refills | Status: DC

## 2017-07-25 NOTE — Telephone Encounter (Signed)
Called and made patient aware that Dr. Harrington Challenger would like her to start Crestor 20 mg QD. Will repeat LIPIDS in 8 weeks. Appointment made for 09/22/17. Patient verbalized understanding and thanked me for the call. Rx sent to preferred pharmacy.

## 2017-07-25 NOTE — Telephone Encounter (Signed)
-----   Message from Fay Records, MD sent at 07/24/2017  7:55 PM EST ----- Start Crestor 20 mg   F/U lpids in 8 wks

## 2017-08-08 ENCOUNTER — Inpatient Hospital Stay (HOSPITAL_COMMUNITY)
Admission: EM | Admit: 2017-08-08 | Discharge: 2017-08-14 | DRG: 190 | Disposition: A | Discharge status: Home Health | Payer: Medicare Other | Attending: Internal Medicine | Admitting: Internal Medicine

## 2017-08-08 DIAGNOSIS — M1990 Unspecified osteoarthritis, unspecified site: Secondary | ICD-10-CM | POA: Diagnosis present

## 2017-08-08 DIAGNOSIS — J9621 Acute and chronic respiratory failure with hypoxia: Secondary | ICD-10-CM | POA: Diagnosis present

## 2017-08-08 DIAGNOSIS — Z87891 Personal history of nicotine dependence: Secondary | ICD-10-CM

## 2017-08-08 DIAGNOSIS — I209 Angina pectoris, unspecified: Secondary | ICD-10-CM | POA: Diagnosis present

## 2017-08-08 DIAGNOSIS — Z9981 Dependence on supplemental oxygen: Secondary | ICD-10-CM

## 2017-08-08 DIAGNOSIS — J441 Chronic obstructive pulmonary disease with (acute) exacerbation: Secondary | ICD-10-CM | POA: Diagnosis not present

## 2017-08-08 DIAGNOSIS — Z7982 Long term (current) use of aspirin: Secondary | ICD-10-CM

## 2017-08-08 DIAGNOSIS — Z7951 Long term (current) use of inhaled steroids: Secondary | ICD-10-CM

## 2017-08-08 DIAGNOSIS — D481 Neoplasm of uncertain behavior of connective and other soft tissue: Secondary | ICD-10-CM | POA: Diagnosis present

## 2017-08-08 DIAGNOSIS — Z6830 Body mass index (BMI) 30.0-30.9, adult: Secondary | ICD-10-CM

## 2017-08-08 DIAGNOSIS — I10 Essential (primary) hypertension: Secondary | ICD-10-CM | POA: Diagnosis present

## 2017-08-08 DIAGNOSIS — Z66 Do not resuscitate: Secondary | ICD-10-CM | POA: Diagnosis present

## 2017-08-08 DIAGNOSIS — E669 Obesity, unspecified: Secondary | ICD-10-CM | POA: Diagnosis present

## 2017-08-08 DIAGNOSIS — Z79899 Other long term (current) drug therapy: Secondary | ICD-10-CM

## 2017-08-08 DIAGNOSIS — I4891 Unspecified atrial fibrillation: Secondary | ICD-10-CM | POA: Diagnosis present

## 2017-08-08 DIAGNOSIS — M79602 Pain in left arm: Secondary | ICD-10-CM | POA: Diagnosis present

## 2017-08-08 DIAGNOSIS — M8580 Other specified disorders of bone density and structure, unspecified site: Secondary | ICD-10-CM | POA: Diagnosis present

## 2017-08-08 DIAGNOSIS — R531 Weakness: Secondary | ICD-10-CM | POA: Diagnosis present

## 2017-08-08 DIAGNOSIS — E785 Hyperlipidemia, unspecified: Secondary | ICD-10-CM | POA: Diagnosis present

## 2017-08-08 DIAGNOSIS — I471 Supraventricular tachycardia: Secondary | ICD-10-CM | POA: Diagnosis present

## 2017-08-08 DIAGNOSIS — Z8619 Personal history of other infectious and parasitic diseases: Secondary | ICD-10-CM
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
## 2017-08-09 ENCOUNTER — Emergency Department (HOSPITAL_COMMUNITY): Payer: Medicare Other

## 2017-08-09 ENCOUNTER — Other Ambulatory Visit: Payer: Self-pay

## 2017-08-09 ENCOUNTER — Encounter (HOSPITAL_COMMUNITY): Payer: Self-pay | Admitting: Emergency Medicine

## 2017-08-09 DIAGNOSIS — Z7951 Long term (current) use of inhaled steroids: Secondary | ICD-10-CM | POA: Diagnosis not present

## 2017-08-09 DIAGNOSIS — E785 Hyperlipidemia, unspecified: Secondary | ICD-10-CM | POA: Diagnosis present

## 2017-08-09 DIAGNOSIS — I209 Angina pectoris, unspecified: Secondary | ICD-10-CM | POA: Diagnosis present

## 2017-08-09 DIAGNOSIS — J9621 Acute and chronic respiratory failure with hypoxia: Secondary | ICD-10-CM | POA: Diagnosis present

## 2017-08-09 DIAGNOSIS — E7800 Pure hypercholesterolemia, unspecified: Secondary | ICD-10-CM | POA: Diagnosis not present

## 2017-08-09 DIAGNOSIS — Z8619 Personal history of other infectious and parasitic diseases: Secondary | ICD-10-CM | POA: Diagnosis not present

## 2017-08-09 DIAGNOSIS — Z87891 Personal history of nicotine dependence: Secondary | ICD-10-CM | POA: Diagnosis not present

## 2017-08-09 DIAGNOSIS — M79602 Pain in left arm: Secondary | ICD-10-CM | POA: Diagnosis present

## 2017-08-09 DIAGNOSIS — I471 Supraventricular tachycardia: Secondary | ICD-10-CM | POA: Diagnosis present

## 2017-08-09 DIAGNOSIS — D481 Neoplasm of uncertain behavior of connective and other soft tissue: Secondary | ICD-10-CM | POA: Diagnosis present

## 2017-08-09 DIAGNOSIS — Z79899 Other long term (current) drug therapy: Secondary | ICD-10-CM | POA: Diagnosis not present

## 2017-08-09 DIAGNOSIS — Z6830 Body mass index (BMI) 30.0-30.9, adult: Secondary | ICD-10-CM | POA: Diagnosis not present

## 2017-08-09 DIAGNOSIS — M8580 Other specified disorders of bone density and structure, unspecified site: Secondary | ICD-10-CM | POA: Diagnosis present

## 2017-08-09 DIAGNOSIS — M1990 Unspecified osteoarthritis, unspecified site: Secondary | ICD-10-CM | POA: Diagnosis present

## 2017-08-09 DIAGNOSIS — E669 Obesity, unspecified: Secondary | ICD-10-CM | POA: Diagnosis present

## 2017-08-09 DIAGNOSIS — R531 Weakness: Secondary | ICD-10-CM | POA: Diagnosis present

## 2017-08-09 DIAGNOSIS — Z9981 Dependence on supplemental oxygen: Secondary | ICD-10-CM | POA: Diagnosis not present

## 2017-08-09 DIAGNOSIS — I10 Essential (primary) hypertension: Secondary | ICD-10-CM | POA: Diagnosis present

## 2017-08-09 DIAGNOSIS — I4891 Unspecified atrial fibrillation: Secondary | ICD-10-CM | POA: Diagnosis present

## 2017-08-09 DIAGNOSIS — I5030 Unspecified diastolic (congestive) heart failure: Secondary | ICD-10-CM | POA: Diagnosis not present

## 2017-08-09 DIAGNOSIS — J441 Chronic obstructive pulmonary disease with (acute) exacerbation: Secondary | ICD-10-CM | POA: Diagnosis present

## 2017-08-09 DIAGNOSIS — Z66 Do not resuscitate: Secondary | ICD-10-CM | POA: Diagnosis present

## 2017-08-09 DIAGNOSIS — Z7982 Long term (current) use of aspirin: Secondary | ICD-10-CM | POA: Diagnosis not present

## 2017-08-09 LAB — COMPREHENSIVE METABOLIC PANEL
ALT: 11 U/L — AB (ref 14–54)
AST: 17 U/L (ref 15–41)
Albumin: 3.6 g/dL (ref 3.5–5.0)
Alkaline Phosphatase: 72 U/L (ref 38–126)
Anion gap: 11 (ref 5–15)
BUN: 10 mg/dL (ref 6–20)
CO2: 28 mmol/L (ref 22–32)
CREATININE: 0.7 mg/dL (ref 0.44–1.00)
Calcium: 8.7 mg/dL — ABNORMAL LOW (ref 8.9–10.3)
Chloride: 102 mmol/L (ref 101–111)
Glucose, Bld: 120 mg/dL — ABNORMAL HIGH (ref 65–99)
Potassium: 3.7 mmol/L (ref 3.5–5.1)
Sodium: 141 mmol/L (ref 135–145)
Total Bilirubin: 1 mg/dL (ref 0.3–1.2)
Total Protein: 7.2 g/dL (ref 6.5–8.1)

## 2017-08-09 LAB — BASIC METABOLIC PANEL
Anion gap: 11 (ref 5–15)
BUN: 9 mg/dL (ref 6–20)
CALCIUM: 8.5 mg/dL — AB (ref 8.9–10.3)
CHLORIDE: 104 mmol/L (ref 101–111)
CO2: 28 mmol/L (ref 22–32)
CREATININE: 0.74 mg/dL (ref 0.44–1.00)
GFR calc Af Amer: >60 mL/min (ref >60)
GFR calc non Af Amer: >60 mL/min (ref >60)
Glucose, Bld: 192 mg/dL — ABNORMAL HIGH (ref 65–99)
Potassium: 3.6 mmol/L (ref 3.5–5.1)
Sodium: 143 mmol/L (ref 135–145)

## 2017-08-09 LAB — CBC WITH DIFFERENTIAL/PLATELET
BASOS PCT: 0 %
Basophils Absolute: 0 10*3/uL (ref 0.0–0.1)
EOS ABS: 0.1 10*3/uL (ref 0.0–0.7)
Eosinophils Relative: 2 %
HCT: 48.1 % — ABNORMAL HIGH (ref 36.0–46.0)
HEMOGLOBIN: 15.2 g/dL — AB (ref 12.0–15.0)
Lymphocytes Relative: 46 %
Lymphs Abs: 3.6 10*3/uL (ref 0.7–4.0)
MCH: 31.7 pg (ref 26.0–34.0)
MCHC: 31.6 g/dL (ref 30.0–36.0)
MCV: 100.2 fL — ABNORMAL HIGH (ref 78.0–100.0)
MONOS PCT: 8 %
Monocytes Absolute: 0.7 10*3/uL (ref 0.1–1.0)
NEUTROS PCT: 44 %
Neutro Abs: 3.4 10*3/uL (ref 1.7–7.7)
Platelets: 228 10*3/uL (ref 150–400)
RBC: 4.8 MIL/uL (ref 3.87–5.11)
RDW: 12.9 % (ref 11.5–15.5)
WBC: 7.8 10*3/uL (ref 4.0–10.5)

## 2017-08-09 LAB — TROPONIN I
Troponin I: <0.03 ng/mL (ref <0.03)
Troponin I: <0.03 ng/mL (ref <0.03)
Troponin I: <0.03 ng/mL (ref <0.03)

## 2017-08-09 LAB — CBC
HCT: 44.9 % (ref 36.0–46.0)
Hemoglobin: 14 g/dL (ref 12.0–15.0)
MCH: 31.3 pg (ref 26.0–34.0)
MCHC: 31.2 g/dL (ref 30.0–36.0)
MCV: 100.2 fL — AB (ref 78.0–100.0)
PLATELETS: 210 10*3/uL (ref 150–400)
RBC: 4.48 MIL/uL (ref 3.87–5.11)
RDW: 12.6 % (ref 11.5–15.5)
WBC: 8.3 10*3/uL (ref 4.0–10.5)

## 2017-08-09 LAB — TSH: TSH: 0.601 u[IU]/mL (ref 0.350–4.500)

## 2017-08-09 LAB — MAGNESIUM: MAGNESIUM: 1.6 mg/dL — AB (ref 1.7–2.4)

## 2017-08-09 LAB — INFLUENZA PANEL BY PCR (TYPE A & B)
INFLBPCR: NEGATIVE
Influenza A By PCR: NEGATIVE

## 2017-08-09 LAB — BRAIN NATRIURETIC PEPTIDE: B NATRIURETIC PEPTIDE 5: 121.3 pg/mL — AB (ref 0.0–100.0)

## 2017-08-09 LAB — MRSA PCR SCREENING: MRSA BY PCR: NEGATIVE

## 2017-08-09 MED ORDER — ONDANSETRON HCL 4 MG/2ML IJ SOLN: 4.0000 mg | Freq: Four times a day (QID) | INTRAMUSCULAR | Status: DC | PRN

## 2017-08-09 MED ORDER — DILTIAZEM HCL 100 MG IV SOLR: 5.0000 mg/h | INTRAVENOUS | Status: DC

## 2017-08-09 MED ORDER — DILTIAZEM LOAD VIA INFUSION
10.0000 mg | Freq: Once | INTRAVENOUS | Status: DC
  Administered 2017-08-09: 10 mg via INTRAVENOUS
  Filled 2017-08-09: qty 10

## 2017-08-09 MED ORDER — ENOXAPARIN SODIUM 40 MG/0.4ML ~~LOC~~ SOLN
40.0000 mg | SUBCUTANEOUS | Status: DC
  Filled 2017-08-09: qty 0.4

## 2017-08-09 MED ORDER — DILTIAZEM HCL 100 MG IV SOLR
5.0000 mg/h | INTRAVENOUS | Status: DC
  Administered 2017-08-09: 5 mg/h via INTRAVENOUS
  Filled 2017-08-09 (×2): qty 100

## 2017-08-09 MED ORDER — MAGNESIUM SULFATE 2 GM/50ML IV SOLN
2.0000 g | Freq: Once | INTRAVENOUS | Status: AC
  Administered 2017-08-09: 2 g via INTRAVENOUS
  Filled 2017-08-09: qty 50

## 2017-08-09 MED ORDER — IPRATROPIUM-ALBUTEROL 0.5-2.5 (3) MG/3ML IN SOLN
3.0000 mL | Freq: Four times a day (QID) | RESPIRATORY_TRACT | Status: DC | PRN
  Administered 2017-08-11: 3 mL via RESPIRATORY_TRACT
  Filled 2017-08-09: qty 3

## 2017-08-09 MED ORDER — METHYLPREDNISOLONE SODIUM SUCC 40 MG IJ SOLR
40.0000 mg | Freq: Two times a day (BID) | INTRAMUSCULAR | Status: AC
  Administered 2017-08-09 – 2017-08-12 (×8): 40 mg via INTRAVENOUS
  Filled 2017-08-09 (×8): qty 1

## 2017-08-09 MED ORDER — ASPIRIN EC 81 MG PO TBEC: 81.0000 mg | DELAYED_RELEASE_TABLET | Freq: Every day | ORAL | Status: DC | PRN

## 2017-08-09 MED ORDER — ALBUTEROL SULFATE (2.5 MG/3ML) 0.083% IN NEBU
2.5000 mg | INHALATION_SOLUTION | RESPIRATORY_TRACT | Status: DC
  Administered 2017-08-09 (×3): 2.5 mg via RESPIRATORY_TRACT
  Filled 2017-08-09 (×4): qty 3

## 2017-08-09 MED ORDER — AZITHROMYCIN 250 MG PO TABS
500.0000 mg | ORAL_TABLET | Freq: Every day | ORAL | Status: AC
  Administered 2017-08-10 – 2017-08-13 (×4): 500 mg via ORAL
  Filled 2017-08-09 (×4): qty 2

## 2017-08-09 MED ORDER — DILTIAZEM HCL 60 MG PO TABS
60.0000 mg | ORAL_TABLET | Freq: Two times a day (BID) | ORAL | Status: DC
  Administered 2017-08-09: 60 mg via ORAL
  Filled 2017-08-09: qty 1

## 2017-08-09 MED ORDER — ROSUVASTATIN CALCIUM 10 MG PO TABS
20.0000 mg | ORAL_TABLET | Freq: Every day | ORAL | Status: DC
  Administered 2017-08-09 – 2017-08-14 (×6): 20 mg via ORAL
  Filled 2017-08-09 (×4): qty 2
  Filled 2017-08-09: qty 1
  Filled 2017-08-09 (×2): qty 2

## 2017-08-09 MED ORDER — GUAIFENESIN ER 600 MG PO TB12
600.0000 mg | ORAL_TABLET | Freq: Every day | ORAL | Status: DC
  Administered 2017-08-09 – 2017-08-14 (×6): 600 mg via ORAL
  Filled 2017-08-09 (×6): qty 1

## 2017-08-09 MED ORDER — ACETAMINOPHEN 650 MG RE SUPP: 650.0000 mg | Freq: Four times a day (QID) | RECTAL | Status: DC | PRN

## 2017-08-09 MED ORDER — ASPIRIN EC 81 MG PO TBEC
81.0000 mg | DELAYED_RELEASE_TABLET | Freq: Every day | ORAL | Status: DC
  Administered 2017-08-09 – 2017-08-14 (×6): 81 mg via ORAL
  Filled 2017-08-09 (×6): qty 1

## 2017-08-09 MED ORDER — BUDESONIDE 0.5 MG/2ML IN SUSP
0.5000 mg | Freq: Two times a day (BID) | RESPIRATORY_TRACT | Status: DC
  Administered 2017-08-09 – 2017-08-14 (×11): 0.5 mg via RESPIRATORY_TRACT
  Filled 2017-08-09 (×11): qty 2

## 2017-08-09 MED ORDER — ENOXAPARIN SODIUM 80 MG/0.8ML ~~LOC~~ SOLN
80.0000 mg | Freq: Two times a day (BID) | SUBCUTANEOUS | Status: DC
  Administered 2017-08-09 – 2017-08-10 (×2): 80 mg via SUBCUTANEOUS
  Filled 2017-08-09 (×2): qty 0.8

## 2017-08-09 MED ORDER — ALBUTEROL (5 MG/ML) CONTINUOUS INHALATION SOLN
10.0000 mg/h | INHALATION_SOLUTION | RESPIRATORY_TRACT | Status: DC
  Administered 2017-08-09: 10 mg/h via RESPIRATORY_TRACT
  Filled 2017-08-09: qty 20

## 2017-08-09 MED ORDER — SODIUM CHLORIDE 0.9 % IV SOLN: 500.0000 mg | INTRAVENOUS | Status: DC

## 2017-08-09 MED ORDER — ACETAMINOPHEN 325 MG PO TABS
650.0000 mg | ORAL_TABLET | Freq: Four times a day (QID) | ORAL | Status: DC | PRN
  Administered 2017-08-09 – 2017-08-11 (×2): 650 mg via ORAL
  Filled 2017-08-09 (×2): qty 2

## 2017-08-09 MED ORDER — POTASSIUM CHLORIDE CRYS ER 20 MEQ PO TBCR
20.0000 meq | EXTENDED_RELEASE_TABLET | Freq: Every day | ORAL | Status: AC
  Administered 2017-08-09 – 2017-08-11 (×3): 20 meq via ORAL
  Filled 2017-08-09 (×3): qty 1

## 2017-08-09 MED ORDER — DILTIAZEM LOAD VIA INFUSION
10.0000 mg | Freq: Once | INTRAVENOUS | Status: DC
  Filled 2017-08-09: qty 10

## 2017-08-09 MED ORDER — IPRATROPIUM-ALBUTEROL 0.5-2.5 (3) MG/3ML IN SOLN
3.0000 mL | Freq: Four times a day (QID) | RESPIRATORY_TRACT | Status: DC
  Administered 2017-08-09 – 2017-08-11 (×8): 3 mL via RESPIRATORY_TRACT
  Filled 2017-08-09 (×8): qty 3

## 2017-08-09 MED ORDER — DILTIAZEM HCL ER 60 MG PO CP12
60.0000 mg | ORAL_CAPSULE | Freq: Two times a day (BID) | ORAL | Status: DC
  Administered 2017-08-09 – 2017-08-14 (×9): 60 mg via ORAL
  Filled 2017-08-09 (×12): qty 1

## 2017-08-09 MED ORDER — ALBUTEROL SULFATE (2.5 MG/3ML) 0.083% IN NEBU
2.5000 mg | INHALATION_SOLUTION | RESPIRATORY_TRACT | Status: DC | PRN
  Filled 2017-08-09: qty 3

## 2017-08-09 MED ORDER — SODIUM CHLORIDE 0.9 % IV SOLN
500.0000 mg | Freq: Once | INTRAVENOUS | Status: AC
  Administered 2017-08-09: 500 mg via INTRAVENOUS
  Filled 2017-08-09: qty 500

## 2017-08-09 MED ORDER — ONDANSETRON HCL 4 MG PO TABS
4.0000 mg | ORAL_TABLET | Freq: Four times a day (QID) | ORAL | Status: DC | PRN
  Administered 2017-08-14: 4 mg via ORAL
  Filled 2017-08-09 (×2): qty 1

## 2017-08-09 MED ORDER — IPRATROPIUM BROMIDE 0.02 % IN SOLN
0.5000 mg | RESPIRATORY_TRACT | Status: DC
  Administered 2017-08-09 (×3): 0.5 mg via RESPIRATORY_TRACT
  Filled 2017-08-09 (×3): qty 2.5

## 2017-08-09 NOTE — ED Notes (Signed)
Spoke with pharmacy to have med sent to [facility]

## 2017-08-09 NOTE — Progress Notes (Signed)
[facility] PROGRESS NOTE  LAFONDA PATRON JWJ:[id number] DOB: 1931/10/06 DOA: 08/08/2017 PCP: Patient, No Pcp Per  Brief summary   84 y.o. female with history of COPD, chronic respiratory failure on home oxygen, SVT, hypertension and hyperlipidemia has been having shortness of breath but last 24 hours it acutely worsened with increasing cough and productive sputum.  Last evening while at home patient also started developing left arm pain.  Denies any chest pain as such.  Denies any fever chills or diaphoresis.  Since patient's left arm discomfort persisted along with worsening shortness of breath patient called EMS.  ED Course: In the ER patient was found to be wheezing with chest x-ray was on patient was given nebulizer treatment following which patient's left arm pain resolved.  Troponin was negative.  Patient is being admitted for further management of COPD exacerbation and left arm pain concerning for angina.  EKG shows sinus tachycardia with T wave changes in anterior leads comparable to the one done in July 21, 2017.Marland Kitchen Developed a fib RVR in ED    Assessment/Plan:  COPD exacerbation. Cont IV Solu-Medrol and Zithromax, bronchodilators, oxygen.  Neg for influenza PCR.  Acute on chronic hypoxic respiratory failure due to COPD. On home oxygen 2-3 LPM. Currently, stable at higher flow at 5-6 LPM. Cont as above,. NiPPV as  needde  A fib episode of RVR in ED. Resolved with iv Cardizem bolus. chadsvasc score of 4. resume oral cardizem. Check echo. Replace electrolytes. D/w patient, her family regarding anticoagulation risks benefits. Will start anticoagulation/lovenox, transition to oral noac AM. If persistent a fib may benefit from DCCV.   Left arm pain, chest pain, dyspnea, now a fib. R/o underling angina. ecg with mild t wave changes. Trop: negative. Will ask cardiology eval for stress test. Cont aspirin, statins.  check 2D echo.  Hypertension on Cardizem.  History of hyperlipidemia  on statins.   Code Status: full; Family Communication: d/w patient, her family  (indicate person spoken with, relationship, and if by phone, the number) Disposition Plan: home pend clinical improvement    Consultants:  none  Procedures:  none  Antibiotics:  Azithromycin  (indicate start date, and stop date if known)  HPI/Subjective: Alert, mild dyspnea. Reports left arm pain earlier today.   Objective: Vitals:   08/09/17 1322 08/09/17 1437  BP:    Pulse:    Resp:    Temp:  98.1 F (36.7 C)  SpO2: 96%    No intake or output data in the 24 hours ending 08/09/17 1452 Filed Weights   08/09/17 0019  Weight: 81.6 kg (180 lb)    Exam:   General:  Alert. mild distress   Cardiovascular: s1s2 irregular   Respiratory: poor ventilation BL  Abdomen: soft, obese, nt  Musculoskeletal: no leg edema    Data Reviewed: Basic Metabolic Panel: Recent Labs  Lab 08/08/17 2350 08/09/17 0347 08/09/17 1000  NA 141 143  --   K 3.7 3.6  --   CL 102 104  --   CO2 28 28  --   GLUCOSE 120* 192*  --   BUN 10 9  --   CREATININE 0.70 0.74  --   CALCIUM 8.7* 8.5*  --   MG  --   --  1.6*   Liver Function Tests: Recent Labs  Lab 08/08/17 2350  AST 17  ALT 11*  ALKPHOS 72  BILITOT 1.0  PROT 7.2  ALBUMIN 3.6   No results for input(s): LIPASE, AMYLASE in the  last 168 hours. No results for input(s): AMMONIA in the last 168 hours. CBC: Recent Labs  Lab 08/08/17 2350 08/09/17 0347  WBC 7.8 8.3  NEUTROABS 3.4  --   HGB 15.2* 14.0  HCT 48.1* 44.9  MCV 100.2* 100.2*  PLT 228 210   Cardiac Enzymes: Recent Labs  Lab 08/08/17 2350 08/09/17 0347 08/09/17 1000  TROPONINI <0.03 <0.03 <0.03   BNP (last 3 results) Recent Labs    08/08/17 2350  BNP 121.3*    ProBNP (last 3 results) No results for input(s): PROBNP in the last 8760 hours.  CBG: No results for input(s): GLUCAP in the last 168 hours.  No results found for this or any previous visit (from the  past 240 hour(s)).   Studies: Dg Chest 2 View  Result Date: 08/09/2017 CLINICAL DATA:  Chest pain and shortness of breath. EXAM: CHEST  2 VIEW COMPARISON:  Radiograph 03/26/2017 FINDINGS: Significant patient rotation limits assessment. The heart is enlarged, similar to prior exam allowing for differences in technique. Tortuosity of the thoracic aorta. Chronic hyperinflation and interstitial coarsening. Scarring in bilateral mid lung zones. No pulmonary edema. No pneumothorax or large pleural effusion. Chronic deformity of posterior left ribs. IMPRESSION: Rotated exam with stable cardiomegaly and chronic hyperinflation. No definite acute abnormality. Electronically Signed   By: Jeb Levering M.D.   On: 08/09/2017 00:23    Scheduled Meds: . albuterol  2.5 mg Nebulization Q4H  . aspirin EC  81 mg Oral Daily  . [START ON 08/10/2017] azithromycin  500 mg Oral Daily  . budesonide  0.5 mg Nebulization BID  . diltiazem  60 mg Oral Q12H  . diltiazem  10 mg Intravenous Once  . enoxaparin (LOVENOX) injection  40 mg Subcutaneous Q24H  . guaiFENesin  600 mg Oral Daily  . ipratropium  0.5 mg Nebulization Q4H  . methylPREDNISolone (SOLU-MEDROL) injection  40 mg Intravenous Q12H  . rosuvastatin  20 mg Oral Daily   Continuous Infusions:  Principal Problem:   COPD with acute exacerbation ([place]) Active Problems:   Essential hypertension   Left arm pain   Abdominal fibromatosis    Time spent: >35 minutes     Kinnie Feil  [facility] Pager [phone]. If 7PM-7AM, please contact night-coverage at [email or web address], password [facility] 08/09/2017, 2:52 PM  LOS: 0 days

## 2017-08-09 NOTE — ED Notes (Signed)
Spoke with pharmacy for Cardiazem drip

## 2017-08-09 NOTE — ED Notes (Signed)
MD paged about Sinus Tachy with [email or web address]

## 2017-08-09 NOTE — ED Notes (Signed)
Lunch tray ordered 

## 2017-08-09 NOTE — ED Notes (Signed)
Attending MD changed order to step down and Cardiazem drip

## 2017-08-09 NOTE — ED Notes (Signed)
Purewik in place, functioning properly

## 2017-08-09 NOTE — ED Notes (Signed)
Linens changed, purwick readjusted. Pt now on NRB

## 2017-08-09 NOTE — ED Notes (Signed)
Requested pharmacy verify medications 

## 2017-08-09 NOTE — Progress Notes (Signed)
ANTICOAGULATION CONSULT NOTE - Initial Consult  Pharmacy Consult for Lovenox Indication: atrial fibrillation  Patient Measurements: Height: 5\' 5"  (165.1 cm) Weight: 180 lb (81.6 kg) IBW/kg (Calculated) : 57  Labs: Recent Labs    08/08/17 2350 08/09/17 0347 08/09/17 1000  HGB 15.2* 14.0  --   HCT 48.1* 44.9  --   PLT 228 210  --   CREATININE 0.70 0.74  --   TROPONINI <0.03 <0.03 <0.03   Estimated Creatinine Clearance: 56.2 mL/min (by C-G formula based on SCr of 0.74 mg/dL).  Assessment: 84 y/o F starting on Lovenox for anticoagulation secondary to Afib. CBC stable, has not had any bleeding, CHADS-VASc of 4. May transition to [facility] tomorrow.   Goal of Therapy:  Monitor platelets by anticoagulation protocol: Yes   Plan:  Lovenox 1mg /kg Q12hrs (80mg ) Monitor CBC, s/sx of bleeding   Patterson Hammersmith PharmD PGY1 Pharmacy Practice Resident 08/09/2017 3:19 PM Pager: [phone] Phone: [phone]

## 2017-08-09 NOTE — ED Notes (Signed)
Ordered breakfast tray  

## 2017-08-09 NOTE — ED Notes (Signed)
Pt HR 140s when RT in room attmepting to give neb tx, HR went back down to 80s-90s and med was given, will continue to monitor

## 2017-08-09 NOTE — ED Notes (Signed)
Handoff report given

## 2017-08-09 NOTE — ED Notes (Signed)
MD at bedside for re-evaluation.

## 2017-08-09 NOTE — ED Notes (Addendum)
Pt arm became red and itchy below and above IV site from IV Cardizem, infusion stopped and MD Buriev informed. Pt noted to take oral cardizem at home with no problems.

## 2017-08-09 NOTE — ED Notes (Addendum)
Pt noted to be in afib from 110s-140s. Pharmacy called to mix Cardizem ordered for pt.

## 2017-08-09 NOTE — Discharge Summary (Signed)
RT at bedside for neb

## 2017-08-09 NOTE — ED Notes (Addendum)
Pt voided in her bed, pt moved to regular hospital bed on fresh linens. Tolerated movement poorly and had increased shortness of breath.
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
## 2017-08-09 NOTE — ED Notes (Signed)
Patient placed on a monitor /pulse oximetry , assisted by NT to use a bedside commode to urinate .
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
## 2017-08-09 NOTE — ED Notes (Signed)
Dr. Daleen Bo paged to [place], RN-paged by Levada Dy

## 2017-08-09 NOTE — ED Notes (Signed)
RT at bedside d/t pt c/o increased sob, pt O2 increased to 4 L [place]

## 2017-08-09 NOTE — ED Notes (Signed)
RT called for continuous neb 

## 2017-08-10 ENCOUNTER — Inpatient Hospital Stay (HOSPITAL_COMMUNITY): Payer: Medicare Other

## 2017-08-10 DIAGNOSIS — I5030 Unspecified diastolic (congestive) heart failure: Secondary | ICD-10-CM

## 2017-08-10 DIAGNOSIS — M79602 Pain in left arm: Secondary | ICD-10-CM

## 2017-08-10 LAB — BASIC METABOLIC PANEL
ANION GAP: 7 (ref 5–15)
BUN: 14 mg/dL (ref 6–20)
CALCIUM: 8.2 mg/dL — AB (ref 8.9–10.3)
CO2: 31 mmol/L (ref 22–32)
Chloride: 101 mmol/L (ref 101–111)
Creatinine, Ser: 0.77 mg/dL (ref 0.44–1.00)
GLUCOSE: 177 mg/dL — AB (ref 65–99)
POTASSIUM: 4.1 mmol/L (ref 3.5–5.1)
SODIUM: 139 mmol/L (ref 135–145)

## 2017-08-10 LAB — ECHOCARDIOGRAM COMPLETE
HEIGHTINCHES: 65 in
Weight: 2895.9626 oz

## 2017-08-10 LAB — MAGNESIUM: MAGNESIUM: 2.2 mg/dL (ref 1.7–2.4)

## 2017-08-10 MED ORDER — ENOXAPARIN SODIUM 40 MG/0.4ML ~~LOC~~ SOLN
40.0000 mg | SUBCUTANEOUS | Status: DC
  Administered 2017-08-10 – 2017-08-13 (×4): 40 mg via SUBCUTANEOUS
  Filled 2017-08-10 (×4): qty 0.4

## 2017-08-10 NOTE — Progress Notes (Signed)
[facility] PROGRESS NOTE  [name] GUR:[id number] DOB: 02/04/1932 DOA: 08/08/2017 PCP: Patient, No Pcp Per  Brief summary   84 y.o. female with history of COPD, chronic respiratory failure on home oxygen, SVT, hypertension and hyperlipidemia has been having shortness of breath but last 24 hours it acutely worsened with increasing cough and productive sputum.  Last evening while at home patient also started developing left arm pain.  Denies any chest pain as such.  Denies any fever chills or diaphoresis.  Since patient's left arm discomfort persisted along with worsening shortness of breath patient called EMS.  ED Course: In the ER patient was found to be wheezing with chest x-ray was on patient was given nebulizer treatment following which patient's left arm pain resolved.  Troponin was negative.  Patient is being admitted for further management of COPD exacerbation and left arm pain concerning for angina.  EKG shows sinus tachycardia with T wave changes in anterior leads comparable to the one done in July 21, 2017.Marland Kitchen Developed a fib RVR in ED    Assessment/Plan:  COPD exacerbation. Cont IV Solu-Medrol and Zithromax, bronchodilators, oxygen.  Neg for influenza PCR. Will need outpatient pulmonology follow up   Acute on chronic hypoxic respiratory failure due to COPD. On home oxygen 2-3 LPM. Currently, stable at low flow oxygen. Cont as above,. NiPPV as  needde  ? A fib of RVR in ED. Resolved with iv Cardizem bolus. Per cardiology no obvious afib on admission tele. I have reviewed tele overnight, questionable recurrence of a fib. D/w cardiology, who kindly agreed to review overnight tele again. chadsvasc score of 4. resumed oral cardizem. Replace electrolytes. Will start anticoagulation/lovenox if a fib, pend cardiology review of her tele. May need outpatient holter at discharge. Pend echo    Left arm pain, chest pain, dyspnea. Atypical presentation. ecg with mild t wave changes.  Trop: negative. No need for stress test per cardiology. Will f/u echo. Cont aspirin, statins.    Hypertension on Cardizem. History of hyperlipidemia on statins.   Code Status: full; Family Communication: d/w patient, her family  (indicate person spoken with, relationship, and if by phone, the number) Disposition Plan: home pend clinical improvement    Consultants:  none  Procedures:  none  Antibiotics:  Azithromycin  (indicate start date, and stop date if known)  HPI/Subjective: Alert, mild dyspnea. Reports left arm pain earlier today.   Objective: Vitals:   08/10/17 0758 08/10/17 0800  BP:  (!) 151/66  Pulse:  77  Resp:  (!) 22  Temp:  97.9 F (36.6 C)  SpO2: 96% 96%    Intake/Output Summary (Last 24 hours) at 08/10/2017 1049 Last data filed at 08/10/2017 0600 Gross per 24 hour  Intake 535 ml  Output 600 ml  Net -65 ml   Filed Weights   08/09/17 0019 08/10/17 0500  Weight: 81.6 kg (180 lb) 82.1 kg (180 lb 16 oz)    Exam:   General:  Alert. mild distress   Cardiovascular: s1s2 irregular   Respiratory: poor ventilation BL  Abdomen: soft, obese, nt  Musculoskeletal: no leg edema    Data Reviewed: Basic Metabolic Panel: Recent Labs  Lab 08/08/17 2350 08/09/17 0347 08/09/17 1000 08/10/17 0212  NA 141 143  --  139  K 3.7 3.6  --  4.1  CL 102 104  --  101  CO2 28 28  --  31  GLUCOSE 120* 192*  --  177*  BUN 10 9  --  14  CREATININE 0.70 0.74  --  0.77  CALCIUM 8.7* 8.5*  --  8.2*  MG  --   --  1.6* 2.2   Liver Function Tests: Recent Labs  Lab 08/08/17 2350  AST 17  ALT 11*  ALKPHOS 72  BILITOT 1.0  PROT 7.2  ALBUMIN 3.6   No results for input(s): LIPASE, AMYLASE in the last 168 hours. No results for input(s): AMMONIA in the last 168 hours. CBC: Recent Labs  Lab 08/08/17 2350 08/09/17 0347  WBC 7.8 8.3  NEUTROABS 3.4  --   HGB 15.2* 14.0  HCT 48.1* 44.9  MCV 100.2* 100.2*  PLT 228 210   Cardiac Enzymes: Recent Labs  Lab  08/08/17 2350 08/09/17 0347 08/09/17 1000 08/09/17 1524  TROPONINI <0.03 <0.03 <0.03 <0.03   BNP (last 3 results) Recent Labs    08/08/17 2350  BNP 121.3*    ProBNP (last 3 results) No results for input(s): PROBNP in the last 8760 hours.  CBG: No results for input(s): GLUCAP in the last 168 hours.  Recent Results (from the past 240 hour(s))  MRSA PCR Screening     Status: None   Collection Time: 08/09/17  3:42 PM  Result Value Ref Range Status   MRSA by PCR NEGATIVE NEGATIVE Final    Comment:        The GeneXpert MRSA Assay (FDA approved for NASAL specimens only), is one component of a comprehensive MRSA colonization surveillance program. It is not intended to diagnose MRSA infection nor to guide or monitor treatment for MRSA infections. Performed at [facility], [place] [address], [place], [place] [address]      Studies: Dg Chest 2 View  Result Date: 08/09/2017 CLINICAL DATA:  Chest pain and shortness of breath. EXAM: CHEST  2 VIEW COMPARISON:  Radiograph 03/26/2017 FINDINGS: Significant patient rotation limits assessment. The heart is enlarged, similar to prior exam allowing for differences in technique. Tortuosity of the thoracic aorta. Chronic hyperinflation and interstitial coarsening. Scarring in bilateral mid lung zones. No pulmonary edema. No pneumothorax or large pleural effusion. Chronic deformity of posterior left ribs. IMPRESSION: Rotated exam with stable cardiomegaly and chronic hyperinflation. No definite acute abnormality. Electronically Signed   By: Jeb Levering M.D.   On: 08/09/2017 00:23    Scheduled Meds: . aspirin EC  81 mg Oral Daily  . azithromycin  500 mg Oral Daily  . budesonide  0.5 mg Nebulization BID  . diltiazem  60 mg Oral Q12H  . diltiazem  10 mg Intravenous Once  . enoxaparin (LOVENOX) injection  80 mg Subcutaneous Q12H  . guaiFENesin  600 mg Oral Daily  . ipratropium-albuterol  3 mL Nebulization QID  . methylPREDNISolone  (SOLU-MEDROL) injection  40 mg Intravenous Q12H  . potassium chloride  20 mEq Oral Daily  . rosuvastatin  20 mg Oral Daily   Continuous Infusions:  Principal Problem:   COPD with acute exacerbation ([place]) Active Problems:   Essential hypertension   Left arm pain   Abdominal fibromatosis    Time spent: >35 minutes     Kinnie Feil  [facility] Pager [phone]. If 7PM-7AM, please contact night-coverage at [email or web address], password [facility] 08/10/2017, 10:49 AM  LOS: 1 day

## 2017-08-10 NOTE — Progress Notes (Signed)
*  PRELIMINARY RESULTS* Echocardiogram 2D Echocardiogram has been performed.  [name] 08/10/2017, 3:49 PM

## 2017-08-10 NOTE — Progress Notes (Signed)
Subjective:  Having trouble with shortness of breath. No further arm pain.  Objective:  Vital Signs in the last 24 hours: BP (!) 120/48   Pulse 70   Temp 97.9 F (36.6 C) (Oral)   Resp 20   Ht 5\' 5"  (1.651 m)   Wt 82.1 kg (180 lb 16 oz)   SpO2 96%   BMI 30.12 kg/m   Physical Exam:  Elderly obese black female in no acute distress,pursed lip breathing Lungs:  Increased AP diameter reduced breath sounds Cardiac:  Regular rhythm with irregular beats noted , normal S1 and S2, no S3 Extremities:  No edema present  Intake/Output from previous day: 03/02 0701 - 03/03 0700 In: 535 [P.O.:480; I.V.:5; IV Piggyback:50] Out: 600 [Urine:600] Weight Filed Weights   08/09/17 0019 08/10/17 0500  Weight: 81.6 kg (180 lb) 82.1 kg (180 lb 16 oz)    Lab Results: Basic Metabolic Panel: Recent Labs    08/09/17 0347 08/10/17 0212  NA 143 139  K 3.6 4.1  CL 104 101  CO2 28 31  GLUCOSE 192* 177*  BUN 9 14  CREATININE 0.74 0.77    CBC: Recent Labs    08/08/17 2350 08/09/17 0347  WBC 7.8 8.3  NEUTROABS 3.4  --   HGB 15.2* 14.0  HCT 48.1* 44.9  MCV 100.2* 100.2*  PLT 228 210    BNP    Component Value Date/Time   BNP 121.3 (H) 08/08/2017 2350    Cardiac Panel (last 3 results) Recent Labs    08/09/17 0347 08/09/17 1000 08/09/17 1524  TROPONINI <0.03 <0.03 <0.03    PROTIME: Lab Results  Component Value Date   INR 0.96 06/08/2013    Telemetry:  Carefully reviewed by me.  She has some episodes of nonsustained supraventricular beats that look like multifocal atrial tachycardia rather than atrial fibrillation to me.  She has frequent PACs.  Assessment/Plan:  1.  Left arm pain atypical for soft 2.  Atrial arrhythmia likely multifocal atrial tachycardia history of SVT 3.  Severe COPD with exacerbation  Recommendations:  Pulmonary consultation.  Await results of echocardiogram.  Add diltiazem to regimen to help with arrhythmia.     Kerry Hough   MD [facility]  08/10/2017, 11:39 AM

## 2017-08-11 ENCOUNTER — Other Ambulatory Visit: Payer: Self-pay

## 2017-08-11 DIAGNOSIS — J441 Chronic obstructive pulmonary disease with (acute) exacerbation: Principal | ICD-10-CM

## 2017-08-11 DIAGNOSIS — E7800 Pure hypercholesterolemia, unspecified: Secondary | ICD-10-CM

## 2017-08-11 DIAGNOSIS — I10 Essential (primary) hypertension: Secondary | ICD-10-CM

## 2017-08-11 DIAGNOSIS — I471 Supraventricular tachycardia: Secondary | ICD-10-CM

## 2017-08-11 MED ORDER — METOPROLOL TARTRATE 12.5 MG HALF TABLET
12.5000 mg | ORAL_TABLET | Freq: Two times a day (BID) | ORAL | Status: DC
  Administered 2017-08-11 – 2017-08-14 (×7): 12.5 mg via ORAL
  Filled 2017-08-11 (×8): qty 1

## 2017-08-11 MED ORDER — IPRATROPIUM-ALBUTEROL 0.5-2.5 (3) MG/3ML IN SOLN
3.0000 mL | Freq: Three times a day (TID) | RESPIRATORY_TRACT | Status: DC
  Administered 2017-08-11 – 2017-08-13 (×5): 3 mL via RESPIRATORY_TRACT
  Filled 2017-08-11 (×5): qty 3

## 2017-08-11 NOTE — Care Management Note (Signed)
Case Management Note  Patient Details  Name: [name] MRN: [id number] Date of Birth: [birth year]  Subjective/Objective:    Pt admitted with CP                 Action/Plan:  PTA from home with daughter.  Pt is on home oxygen 2-3 liters - requiring 5 liters at time of assessment and in noticeable distress.  CM will re- assess pt at a later time   Expected Discharge Date:                  Expected Discharge Plan:     In-House Referral:     Discharge planning Services  CM Consult  Post Acute Care Choice:    Choice offered to:     DME Arranged:    DME Agency:     HH Arranged:    HH Agency:     Status of Service:     If discussed at [employer], dates discussed:    Additional Comments:  Maryclare Labrador, RN 08/11/2017, 4:31 PM

## 2017-08-11 NOTE — Progress Notes (Signed)
While giving pt a bath and getting her cleaned up, pts HR jumped up to 120s-130s. Pt had some shortness of breath. Oxygen was increased to 6L/min. RN administered cardizem 60mg  PO as scheduled. After pts bath was finished, HR came down to 80s-90s. Pt asked for a breathing treatment around 0545, RT notified and breathing treatment administered. Pt has no other concerns at this time. VSS. Will continue to monitor.  Clint Bolder, RN 08/11/17 6:21 AM

## 2017-08-11 NOTE — Progress Notes (Signed)
Progress Note  Patient Name: [name] Date of Encounter: 08/11/2017  Primary Cardiologist: No primary care provider on file.   Subjective   Non-productive cough this am, which is new.  Breathing a little better but not to baseline.  No recurrent arm pain.   Inpatient Medications    Scheduled Meds: . aspirin EC  81 mg Oral Daily  . azithromycin  500 mg Oral Daily  . budesonide  0.5 mg Nebulization BID  . diltiazem  60 mg Oral Q12H  . diltiazem  10 mg Intravenous Once  . enoxaparin (LOVENOX) injection  40 mg Subcutaneous Q24H  . guaiFENesin  600 mg Oral Daily  . ipratropium-albuterol  3 mL Nebulization QID  . methylPREDNISolone (SOLU-MEDROL) injection  40 mg Intravenous Q12H  . potassium chloride  20 mEq Oral Daily  . rosuvastatin  20 mg Oral Daily   Continuous Infusions:  PRN Meds: acetaminophen **OR** acetaminophen, ipratropium-albuterol, ondansetron **OR** ondansetron (ZOFRAN) IV   Vital Signs    Vitals:   08/11/17 0400 08/11/17 0431 08/11/17 0601 08/11/17 0802  BP: (!) 113/43 109/65  (!) 159/72  Pulse: 64  72 86  Resp:   (!) 21 20  Temp:    98 F (36.7 C)  TempSrc:    Axillary  SpO2: 96%  97% 93%  Weight:      Height:       No intake or output data in the 24 hours ending 08/11/17 0823 Filed Weights   08/09/17 0019 08/10/17 0500  Weight: 180 lb (81.6 kg) 180 lb 16 oz (82.1 kg)    Telemetry    MAT and ectopic atrial rhythm.  Rate 50s overnight, up to 120s in daytime. - Personally Reviewed  ECG    n/a - Personally Reviewed  Physical Exam   VS:  BP (!) 169/147   Pulse 98   Temp 98 F (36.7 C) (Axillary)   Resp (!) 30   Ht 5\' 5"  (1.651 m)   Wt 180 lb 16 oz (82.1 kg)   SpO2 92%   BMI 30.12 kg/m  , BMI Body mass index is 30.12 kg/m. GENERAL:  Chronically ill-appearing elderly woman in mild respiratory distress.  Pursed lip breathing.  HEENT: Pupils equal round and reactive, fundi not visualized, oral mucosa unremarkable NECK:  No jugular  venous distention, waveform within normal limits, carotid upstroke brisk and symmetric, no bruits LUNGS:  Clear to auscultation bilaterally HEART:  Irregularly irregular.  Tachycardic.  PMI not displaced or sustained,S1 and S2 within normal limits, no S3, no S4, no clicks, no rubs, no murmurs ABD:  Flat, positive bowel sounds normal in frequency in pitch, no bruits, no rebound, no guarding, no midline pulsatile mass, no hepatomegaly, no splenomegaly EXT:  2 plus pulses throughout, no edema, no cyanosis no clubbing SKIN:  No rashes no nodules NEURO:  Cranial nerves II through XII grossly intact, motor grossly intact throughout [facility]:  Cognitively intact, oriented to person place and time   Labs    Chemistry Recent Labs  Lab 08/08/17 2350 08/09/17 0347 08/10/17 0212  NA 141 143 139  K 3.7 3.6 4.1  CL 102 104 101  CO2 28 28 31   GLUCOSE 120* 192* 177*  BUN 10 9 14   CREATININE 0.70 0.74 0.77  CALCIUM 8.7* 8.5* 8.2*  PROT 7.2  --   --   ALBUMIN 3.6  --   --   AST 17  --   --   ALT 11*  --   --  ALKPHOS 72  --   --   BILITOT 1.0  --   --   GFRNONAA >60 >60 >60  GFRAA >60 >60 >60  ANIONGAP 11 11 7      Hematology Recent Labs  Lab 08/08/17 2350 08/09/17 0347  WBC 7.8 8.3  RBC 4.80 4.48  HGB 15.2* 14.0  HCT 48.1* 44.9  MCV 100.2* 100.2*  MCH 31.7 31.3  MCHC 31.6 31.2  RDW 12.9 12.6  PLT 228 210    Cardiac Enzymes Recent Labs  Lab 08/08/17 2350 08/09/17 0347 08/09/17 1000 08/09/17 1524  TROPONINI <0.03 <0.03 <0.03 <0.03   No results for input(s): TROPIPOC in the last 168 hours.   BNP Recent Labs  Lab 08/08/17 2350  BNP 121.3*     DDimer No results for input(s): DDIMER in the last 168 hours.   Radiology    No results found.  Cardiac Studies   Echo 08/10/17: Study Conclusions  - Left ventricle: The cavity size was normal. There was moderate   concentric hypertrophy. Systolic function was normal. The   estimated ejection fraction was in the range of  60% to 65%. Wall   motion was normal; there were no regional wall motion   abnormalities. Doppler parameters are consistent with abnormal   left ventricular relaxation (grade 1 diastolic dysfunction).   There was no evidence of elevated ventricular filling pressure by   Doppler parameters. - Aortic valve: There was no regurgitation. - Mitral valve: Calcified annulus. There was no regurgitation.   Valve area by pressure half-time: 1.83 cm^2. - Left atrium: The atrium was normal in size. - Right ventricle: Systolic function was normal. - Right atrium: The atrium was normal in size. - Tricuspid valve: There was no regurgitation. - Pulmonary arteries: Systolic pressure was within the normal   range. - Inferior vena cava: The vessel was normal in size. - Pericardium, extracardiac: There was no pericardial effusion.  Patient Profile     84 y.o. female with hypertension, hyperlipidemia, and SVT admitted with COPD exacerbation.  Cardiology was consulted 08/09/17 for arm pain and and atrial arrhythmia.  Assessment & Plan    # MAT: No atrial fibrillation noted.  Diltiazem was started 3/2.  Rates are still >120 at times during the day but in the 50s at night.  Will try adding metoprolol tartrate 12.5mg  bid.  Continue diltiazem.   # COPD Exacerbation:  On home O2.  Per IM.  # Arm pain: Atypical for ischemia and symptoms have resolved.  Cardiac enzymes have been negative.  Echo with normal systolic function.  Continue aspirin.    # Hypertension: BP labile.  Adding metoprolol as above.   # Hyperlipidemia: Continue rosuvastatin.  For questions or updates, please contact [place] Please consult [email or web address] for contact info under Cardiology/STEMI.      Signed, Skeet Latch, MD  08/11/2017, 8:23 AM

## 2017-08-11 NOTE — Progress Notes (Signed)
PT Cancellation Note  Patient Details Name: [name] MRN: [id number] DOB: 05-23-1932   Cancelled Treatment:    Reason Eval/Treat Not Completed: Other (comment)(Pt refused due to feeling SOB.  Nurse states HR incr with activity. HOLD PT today.)   Denice Paradise 08/11/2017, 4:15 PM [facility] Acute Rehabilitation [phone] (pager)

## 2017-08-11 NOTE — Progress Notes (Signed)
Patient ID: [name], female   DOB: 08-10-1931, 84 y.o.   MRN: [id number]  PROGRESS NOTE    [name]  CNO:[id number] DOB: July 19, 1931 DOA: 08/08/2017 PCP: Patient, No Pcp Per   Brief Narrative:  Patient is an 84 year old female with history of COPD on home oxygen who came into the ER with 24 hours for severe shortness of breath and cough. Symptoms did not respond to home medications.  She started having left arm pain as well as sinus tachycardia with mild chest discomfort.  Patient subsequently admitted with COPD exacerbation as well as chest pain with supraventricular tachycardia.  Assessment & Plan:   Principal Problem:   COPD with acute exacerbation ([place]) Active Problems:   Essential hypertension   Left arm pain   Abdominal fibromatosis   #1 acute on chronic respiratory failure: Secondary to COPD exacerbation.  Patient's symptoms are not improving.  She is still on oxygen per home dose.  We will consider pulmonary consultation.  Continue antibiotics with steroids and nebulizer  #2 tachycardia: patient has been evaluated by cardiology.  She has been on Cardizem and now metoprolol added.  Heart rate is slowly improving.  Patient may need to be switched to bisoprolol due to have COPD.  #3 left arm pain: Cardiac workup completed.  No evidence of ischemia.  #4 hypertension: Blood pressure appears better controlled  Continue monitoring.  #5 hyperlipidemia   DVT prophylaxis: Lovenox Code Status: full Family Communication: None at bedside Disposition Plan: to be determined   Consultants:   Cardiology Dr Wynonia Lawman  Procedures: None  Antimicrobials:  azithromycin  Subjective: Patient is still breathing or hard with significant wheezing. He has mild cough and mild shortness of breath.  Objective: Vitals:   08/11/17 1200 08/11/17 1207 08/11/17 1500 08/11/17 1635  BP: (!) 120/52 (!) 120/52 (!) 126/51 (!) 118/46  Pulse: (!) 58 71 66 72  Resp: (!) 24 (!) 22 (!) 25 (!) 22   Temp:  97.6 F (36.4 C)  98.2 F (36.8 C)  TempSrc:  Oral  Oral  SpO2: 93% 93% 95% 97%  Weight:      Height:       No intake or output data in the 24 hours ending 08/11/17 1637 Filed Weights   08/09/17 0019 08/10/17 0500  Weight: 81.6 kg (180 lb) 82.1 kg (180 lb 16 oz)    Examination:  General exam: Appears calm and comfortable  Respiratory system: Clear to auscultation. Respiratory effort normal. Cardiovascular system: S1 & S2 heard, RRR. No JVD, murmurs, rubs, gallops or clicks. No pedal edema. Gastrointestinal system: Abdomen is nondistended, soft and nontender. No organomegaly or masses felt. Normal bowel sounds heard. Central nervous system: Alert and oriented. No focal neurological deficits. Extremities: Symmetric 5 x 5 power. Skin: No rashes, lesions or ulcers Psychiatry: Judgement and insight appear normal. Mood & affect appropriate.     Data Reviewed: I have personally reviewed following labs and imaging studies  CBC: Recent Labs  Lab 08/08/17 2350 08/09/17 0347  WBC 7.8 8.3  NEUTROABS 3.4  --   HGB 15.2* 14.0  HCT 48.1* 44.9  MCV 100.2* 100.2*  PLT 228 294   Basic Metabolic Panel: Recent Labs  Lab 08/08/17 2350 08/09/17 0347 08/09/17 1000 08/10/17 0212  NA 141 143  --  139  K 3.7 3.6  --  4.1  CL 102 104  --  101  CO2 28 28  --  31  GLUCOSE 120* 192*  --  177*  BUN 10 9  --  14  CREATININE 0.70 0.74  --  0.77  CALCIUM 8.7* 8.5*  --  8.2*  MG  --   --  1.6* 2.2   GFR: Estimated Creatinine Clearance: 56.4 mL/min (by C-G formula based on SCr of 0.77 mg/dL). Liver Function Tests: Recent Labs  Lab 08/08/17 2350  AST 17  ALT 11*  ALKPHOS 72  BILITOT 1.0  PROT 7.2  ALBUMIN 3.6   No results for input(s): LIPASE, AMYLASE in the last 168 hours. No results for input(s): AMMONIA in the last 168 hours. Coagulation Profile: No results for input(s): INR, PROTIME in the last 168 hours. Cardiac Enzymes: Recent Labs  Lab 08/08/17 2350  08/09/17 0347 08/09/17 1000 08/09/17 1524  TROPONINI <0.03 <0.03 <0.03 <0.03   BNP (last 3 results) No results for input(s): PROBNP in the last 8760 hours. HbA1C: No results for input(s): HGBA1C in the last 72 hours. CBG: No results for input(s): GLUCAP in the last 168 hours. Lipid Profile: No results for input(s): CHOL, HDL, LDLCALC, TRIG, CHOLHDL, LDLDIRECT in the last 72 hours. Thyroid Function Tests: Recent Labs    08/09/17 1000  TSH 0.601   Anemia Panel: No results for input(s): VITAMINB12, FOLATE, FERRITIN, TIBC, IRON, RETICCTPCT in the last 72 hours. Urine analysis:    Component Value Date/Time   COLORURINE YELLOW 11/14/2014 2021   APPEARANCEUR CLEAR 11/14/2014 2021   LABSPEC 1.004 (L) 11/14/2014 2021   PHURINE 5.5 11/14/2014 2021   GLUCOSEU NEGATIVE 11/14/2014 2021   HGBUR NEGATIVE 11/14/2014 2021   [place] NEGATIVE 11/14/2014 2021   KETONESUR NEGATIVE 11/14/2014 2021   PROTEINUR NEGATIVE 11/14/2014 2021   UROBILINOGEN 1.0 11/14/2014 2021   NITRITE NEGATIVE 11/14/2014 2021   LEUKOCYTESUR NEGATIVE 11/14/2014 2021   Sepsis Labs: @LABRCNTIP (procalcitonin:4,lacticidven:4)  ) Recent Results (from the past 240 hour(s))  MRSA PCR Screening     Status: None   Collection Time: 08/09/17  3:42 PM  Result Value Ref Range Status   MRSA by PCR NEGATIVE NEGATIVE Final    Comment:        The GeneXpert MRSA Assay (FDA approved for NASAL specimens only), is one component of a comprehensive MRSA colonization surveillance program. It is not intended to diagnose MRSA infection nor to guide or monitor treatment for MRSA infections. Performed at [facility], [place] [address], [place], [place] [address]          Radiology Studies: No results found.      Scheduled Meds: . aspirin EC  81 mg Oral Daily  . azithromycin  500 mg Oral Daily  . budesonide  0.5 mg Nebulization BID  . diltiazem  60 mg Oral Q12H  . diltiazem  10 mg Intravenous Once  .  enoxaparin (LOVENOX) injection  40 mg Subcutaneous Q24H  . guaiFENesin  600 mg Oral Daily  . ipratropium-albuterol  3 mL Nebulization TID  . methylPREDNISolone (SOLU-MEDROL) injection  40 mg Intravenous Q12H  . metoprolol tartrate  12.5 mg Oral BID  . rosuvastatin  20 mg Oral Daily   Continuous Infusions:   LOS: 2 days    Time spent:25 Harriett Sine, MD [facility] Pager [phone]   If 7PM-7AM, please contact night-coverage [email or web address] Password [facility] 08/11/2017, 4:37 PM

## 2017-08-12 DIAGNOSIS — J441 Chronic obstructive pulmonary disease with (acute) exacerbation: Secondary | ICD-10-CM

## 2017-08-12 MED ORDER — PREDNISONE 20 MG PO TABS
50.0000 mg | ORAL_TABLET | Freq: Every day | ORAL | Status: DC
  Administered 2017-08-13 – 2017-08-14 (×2): 50 mg via ORAL
  Filled 2017-08-12 (×2): qty 2

## 2017-08-12 MED ORDER — ORAL CARE MOUTH RINSE
15.0000 mL | Freq: Two times a day (BID) | OROMUCOSAL | Status: DC
  Administered 2017-08-12 – 2017-08-14 (×5): 15 mL via OROMUCOSAL
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
## 2017-08-12 NOTE — Progress Notes (Signed)
PT Cancellation Note  Patient Details Name: MOLLIE ROSSANO MRN: [id number] DOB: [birth year]   Cancelled Treatment:    Reason Eval/Treat Not Completed: Medical issues which prohibited therapy(Tachycardia with any movement.  Nurse asked to HOLD.)   Denice Paradise 08/12/2017, 9:28 AM  [facility] [phone] (pager)

## 2017-08-12 NOTE — Progress Notes (Signed)
Progress Note  Patient Name: [name] Date of Encounter: 08/12/2017  Primary Cardiologist: Dorris Carnes, MD   Subjective   Feeling much better.  Still get short of breath with ambulation.    Inpatient Medications    Scheduled Meds: . aspirin EC  81 mg Oral Daily  . azithromycin  500 mg Oral Daily  . budesonide  0.5 mg Nebulization BID  . diltiazem  60 mg Oral Q12H  . diltiazem  10 mg Intravenous Once  . enoxaparin (LOVENOX) injection  40 mg Subcutaneous Q24H  . guaiFENesin  600 mg Oral Daily  . ipratropium-albuterol  3 mL Nebulization TID  . methylPREDNISolone (SOLU-MEDROL) injection  40 mg Intravenous Q12H  . metoprolol tartrate  12.5 mg Oral BID  . rosuvastatin  20 mg Oral Daily   Continuous Infusions:  PRN Meds: acetaminophen **OR** acetaminophen, ipratropium-albuterol, ondansetron **OR** ondansetron (ZOFRAN) IV   Vital Signs    Vitals:   08/12/17 0500 08/12/17 0748 08/12/17 0800 08/12/17 0900  BP: (!) 136/58  122/62 (!) 134/118  Pulse: (!) 58  66 81  Resp: 20  (!) 22 (!) 25  Temp:   (!) 96.9 F (36.1 C)   TempSrc:   Axillary   SpO2: 96% 95% (!) 89% 90%  Weight:      Height:        Intake/Output Summary (Last 24 hours) at 08/12/2017 0948 Last data filed at 08/12/2017 0902 Gross per 24 hour  Intake 360 ml  Output 1550 ml  Net -1190 ml   Filed Weights   08/09/17 0019 08/10/17 0500 08/12/17 0405  Weight: 180 lb (81.6 kg) 180 lb 16 oz (82.1 kg) 183 lb 10.3 oz (83.3 kg)    Telemetry    MAT and ectopic atrial rhythm. Rate mostly <100 bpm.- Personally Reviewed  ECG    n/a - Personally Reviewed  Physical Exam   VS:  BP (!) 134/118   Pulse 81   Temp (!) 96.9 F (36.1 C) (Axillary)   Resp (!) 25   Ht 5\' 5"  (1.651 m)   Wt 183 lb 10.3 oz (83.3 kg)   SpO2 90%   BMI 30.56 kg/m  , BMI Body mass index is 30.56 kg/m. GENERAL:  Chronically ill-appearing elderly woman in mild respiratory distress.  Pursed lip breathing.  HEENT: Pupils equal round and  reactive, fundi not visualized, oral mucosa unremarkable NECK:  No jugular venous distention, waveform within normal limits, carotid upstroke brisk and symmetric, no bruits LUNGS:  Clear to auscultation bilaterally HEART:  Irregularly irregular.  PMI not displaced or sustained,S1 and S2 within normal limits, no S3, no S4, no clicks, no rubs, no murmurs ABD:  Flat, positive bowel sounds normal in frequency in pitch, no bruits, no rebound, no guarding, no midline pulsatile mass, no hepatomegaly, no splenomegaly EXT:  2 plus pulses throughout, no edema, no cyanosis no clubbing SKIN:  No rashes no nodules NEURO:  Cranial nerves II through XII grossly intact, motor grossly intact throughout PSYCH:  Cognitively intact, oriented to person place and time   Labs    Chemistry Recent Labs  Lab 08/08/17 2350 08/09/17 0347 08/10/17 0212  NA 141 143 139  K 3.7 3.6 4.1  CL 102 104 101  CO2 28 28 31   GLUCOSE 120* 192* 177*  BUN 10 9 14   CREATININE 0.70 0.74 0.77  CALCIUM 8.7* 8.5* 8.2*  PROT 7.2  --   --   ALBUMIN 3.6  --   --  AST 17  --   --   ALT 11*  --   --   ALKPHOS 72  --   --   BILITOT 1.0  --   --   GFRNONAA >60 >60 >60  GFRAA >60 >60 >60  ANIONGAP 11 11 7      Hematology Recent Labs  Lab 08/08/17 2350 08/09/17 0347  WBC 7.8 8.3  RBC 4.80 4.48  HGB 15.2* 14.0  HCT 48.1* 44.9  MCV 100.2* 100.2*  MCH 31.7 31.3  MCHC 31.6 31.2  RDW 12.9 12.6  PLT 228 210    Cardiac Enzymes Recent Labs  Lab 08/08/17 2350 08/09/17 0347 08/09/17 1000 08/09/17 1524  TROPONINI <0.03 <0.03 <0.03 <0.03   No results for input(s): TROPIPOC in the last 168 hours.   BNP Recent Labs  Lab 08/08/17 2350  BNP 121.3*     DDimer No results for input(s): DDIMER in the last 168 hours.   Radiology    No results found.  Cardiac Studies   Echo 08/10/17: Study Conclusions  - Left ventricle: The cavity size was normal. There was moderate   concentric hypertrophy. Systolic function was  normal. The   estimated ejection fraction was in the range of 60% to 65%. Wall   motion was normal; there were no regional wall motion   abnormalities. Doppler parameters are consistent with abnormal   left ventricular relaxation (grade 1 diastolic dysfunction).   There was no evidence of elevated ventricular filling pressure by   Doppler parameters. - Aortic valve: There was no regurgitation. - Mitral valve: Calcified annulus. There was no regurgitation.   Valve area by pressure half-time: 1.83 cm^2. - Left atrium: The atrium was normal in size. - Right ventricle: Systolic function was normal. - Right atrium: The atrium was normal in size. - Tricuspid valve: There was no regurgitation. - Pulmonary arteries: Systolic pressure was within the normal   range. - Inferior vena cava: The vessel was normal in size. - Pericardium, extracardiac: There was no pericardial effusion.  Patient Profile     84 y.o. female with hypertension, hyperlipidemia, and SVT admitted with COPD exacerbation.  Cardiology was consulted 08/09/17 for arm pain and and atrial arrhythmia.  Assessment & Plan    # MAT: No atrial fibrillation noted.  Diltiazem was started 3/2.  Rates better since adding metoprolol.  Given that this is a cardioselective beta blocker, no need to switch to an alternative agent.    # COPD Exacerbation:  On home O2.  Symptoms better but still has dyspnea on exertion.  Per IM.  # Arm pain:  Atypical for ischemia and symptoms have resolved.  Cardiac enzymes have been negative.  Echo with normal systolic function.  Continue aspirin.    # Hypertension: BP better controlled.  Continue diltiazem and metoprolol.  # Hyperlipidemia: Continue rosuvastatin.  No additional cardiac recommendations at this time.  We will sign off.   For questions or updates, please contact [place] Please consult [email or web address] for contact info under Cardiology/STEMI.      Signed, Skeet Latch, MD    08/12/2017, 9:48 AM

## 2017-08-12 NOTE — Progress Notes (Addendum)
Patient ID: [name], female   DOB: 1932/03/14, 84 y.o.   MRN: [id number]  PROGRESS NOTE    [name]  ZDG:[id number] DOB: 07-20-1931 DOA: 08/08/2017 PCP: Patient, No Pcp Per   Brief Narrative:  Patient is an 84 year old female with history of COPD on home oxygen who came into the ER with 24 hours for severe shortness of breath and cough. Symptoms did not respond to home medications.  She started having left arm pain as well as sinus tachycardia with mild chest discomfort.  Patient subsequently admitted with COPD exacerbation as well as chest pain with supraventricular tachycardia.  Assessment & Plan:   Principal Problem:   COPD with acute exacerbation ([place]) Active Problems:   Essential hypertension   Left arm pain   Abdominal fibromatosis   #1 acute on chronic respiratory failure: Secondary to COPD exacerbation.  Patient's symptoms have improved today. She becomes hypoxemic with slight movement and also tachycardic.  She is still on oxygen per home dose.  We will consult pulmonary.  Continue antibiotics with steroids and nebulizer  #2 sinus tachycardia: patient has been evaluated by cardiology.  She has been on Cardizem and now metoprolol added.  Heart rate is slowly improving. Still tachycardic with activities however.   #3 left arm pain: Cardiac workup completed.  No evidence of ischemia.  #4 hypertension: Blood pressure appears better controlled  Continue monitoring.  #5 hyperlipidemia: Continue statin   DVT prophylaxis: Lovenox Code Status: full Family Communication: None at bedside Disposition Plan: to be determined   Consultants:   Cardiology Dr Wynonia Lawman  Procedures: None  Antimicrobials:  azithromycin  Subjective: Patient is still breathing or hard with significant wheezing. He has mild cough and mild shortness of breath.  Objective: Vitals:   08/12/17 0800 08/12/17 0900 08/12/17 1000 08/12/17 1242  BP: 122/62 (!) 134/118  (!) 142/119  Pulse: 66 81 99  65  Resp: (!) 22 (!) 25 (!) 25 19  Temp: (!) 96.9 F (36.1 C)   97.9 F (36.6 C)  TempSrc: Axillary   Oral  SpO2: (!) 89% 90% 90% 94%  Weight:      Height:        Intake/Output Summary (Last 24 hours) at 08/12/2017 1252 Last data filed at 08/12/2017 1251 Gross per 24 hour  Intake 720 ml  Output 1550 ml  Net -830 ml   Filed Weights   08/09/17 0019 08/10/17 0500 08/12/17 0405  Weight: 81.6 kg (180 lb) 82.1 kg (180 lb 16 oz) 83.3 kg (183 lb 10.3 oz)    Examination:  General exam: Appears calm and comfortable  Respiratory system: Clear to auscultation. Respiratory effort normal. Cardiovascular system: S1 & S2 heard, RRR. No JVD, murmurs, rubs, gallops or clicks. No pedal edema. Gastrointestinal system: Abdomen is nondistended, soft and nontender. No organomegaly or masses felt. Normal bowel sounds heard. Central nervous system: Alert and oriented. No focal neurological deficits. Extremities: Symmetric 5 x 5 power. Skin: No rashes, lesions or ulcers Psychiatry: Judgement and insight appear normal. Mood & affect appropriate.     Data Reviewed: I have personally reviewed following labs and imaging studies  CBC: Recent Labs  Lab 08/08/17 2350 08/09/17 0347  WBC 7.8 8.3  NEUTROABS 3.4  --   HGB 15.2* 14.0  HCT 48.1* 44.9  MCV 100.2* 100.2*  PLT 228 595   Basic Metabolic Panel: Recent Labs  Lab 08/08/17 2350 08/09/17 0347 08/09/17 1000 08/10/17 0212  NA 141 143  --  139  K  3.7 3.6  --  4.1  CL 102 104  --  101  CO2 28 28  --  31  GLUCOSE 120* 192*  --  177*  BUN 10 9  --  14  CREATININE 0.70 0.74  --  0.77  CALCIUM 8.7* 8.5*  --  8.2*  MG  --   --  1.6* 2.2   GFR: Estimated Creatinine Clearance: 56.8 mL/min (by C-G formula based on SCr of 0.77 mg/dL). Liver Function Tests: Recent Labs  Lab 08/08/17 2350  AST 17  ALT 11*  ALKPHOS 72  BILITOT 1.0  PROT 7.2  ALBUMIN 3.6   No results for input(s): LIPASE, AMYLASE in the last 168 hours. No results for  input(s): AMMONIA in the last 168 hours. Coagulation Profile: No results for input(s): INR, PROTIME in the last 168 hours. Cardiac Enzymes: Recent Labs  Lab 08/08/17 2350 08/09/17 0347 08/09/17 1000 08/09/17 1524  TROPONINI <0.03 <0.03 <0.03 <0.03   BNP (last 3 results) No results for input(s): PROBNP in the last 8760 hours. HbA1C: No results for input(s): HGBA1C in the last 72 hours. CBG: No results for input(s): GLUCAP in the last 168 hours. Lipid Profile: No results for input(s): CHOL, HDL, LDLCALC, TRIG, CHOLHDL, LDLDIRECT in the last 72 hours. Thyroid Function Tests: No results for input(s): TSH, T4TOTAL, FREET4, T3FREE, THYROIDAB in the last 72 hours. Anemia Panel: No results for input(s): VITAMINB12, FOLATE, FERRITIN, TIBC, IRON, RETICCTPCT in the last 72 hours. Urine analysis:    Component Value Date/Time   COLORURINE YELLOW 11/14/2014 2021   APPEARANCEUR CLEAR 11/14/2014 2021   LABSPEC 1.004 (L) 11/14/2014 2021   PHURINE 5.5 11/14/2014 2021   GLUCOSEU NEGATIVE 11/14/2014 2021   HGBUR NEGATIVE 11/14/2014 2021   [place] NEGATIVE 11/14/2014 2021   KETONESUR NEGATIVE 11/14/2014 2021   PROTEINUR NEGATIVE 11/14/2014 2021   UROBILINOGEN 1.0 11/14/2014 2021   NITRITE NEGATIVE 11/14/2014 2021   LEUKOCYTESUR NEGATIVE 11/14/2014 2021   Sepsis Labs: @LABRCNTIP (procalcitonin:4,lacticidven:4)  ) Recent Results (from the past 240 hour(s))  MRSA PCR Screening     Status: None   Collection Time: 08/09/17  3:42 PM  Result Value Ref Range Status   MRSA by PCR NEGATIVE NEGATIVE Final    Comment:        The GeneXpert MRSA Assay (FDA approved for NASAL specimens only), is one component of a comprehensive MRSA colonization surveillance program. It is not intended to diagnose MRSA infection nor to guide or monitor treatment for MRSA infections. Performed at [facility], [place] [address] [place], [place] [address]          Radiology Studies: No results  found.      Scheduled Meds: . aspirin EC  81 mg Oral Daily  . azithromycin  500 mg Oral Daily  . budesonide  0.5 mg Nebulization BID  . diltiazem  60 mg Oral Q12H  . diltiazem  10 mg Intravenous Once  . enoxaparin (LOVENOX) injection  40 mg Subcutaneous Q24H  . guaiFENesin  600 mg Oral Daily  . ipratropium-albuterol  3 mL Nebulization TID  . mouth rinse  15 mL Mouth Rinse BID  . methylPREDNISolone (SOLU-MEDROL) injection  40 mg Intravenous Q12H  . metoprolol tartrate  12.5 mg Oral BID  . [START ON 08/13/2017] predniSONE  50 mg Oral Q breakfast  . rosuvastatin  20 mg Oral Daily   Continuous Infusions:   LOS: 3 days    Time spent:25 Harriett Sine, MD [facility]  Pager [phone]   If 7PM-7AM, please contact night-coverage [email or web address] Password [facility] 08/12/2017, 12:52 PM

## 2017-08-13 MED ORDER — ROFLUMILAST 500 MCG PO TABS
500.0000 ug | ORAL_TABLET | Freq: Every day | ORAL | Status: DC
  Administered 2017-08-13 – 2017-08-14 (×2): 500 ug via ORAL
  Filled 2017-08-13 (×2): qty 1

## 2017-08-13 MED ORDER — IPRATROPIUM-ALBUTEROL 0.5-2.5 (3) MG/3ML IN SOLN: 3.0000 mL | RESPIRATORY_TRACT | Status: DC | PRN

## 2017-08-13 NOTE — Progress Notes (Signed)
Name: [name] MRN: [id number] DOB: 07/10/1931    ADMISSION DATE:  08/08/2017 CONSULTATION DATE:  08/12/2017  REFERRING MD :  Dr. Jonelle Sidle  CHIEF COMPLAINT:  dyspnea  HISTORY OF PRESENT ILLNESS: 84 year old female with past medical history as below, which is significant for end-stage COPD on 3 L of O2 at home, hypertension, and tobacco abuse.  She was followed by Dr. Ashok Cordia in the pulmonary clinic it is now a patient of Dr. Lamonte Sakai, however, she has not seen him as of yet.  She presented to [facility] emergency department 3/1 with complaints of left arm pain in 24 hours of breath not relieved by rescue medications at home.  In the ED she was dyspneic with an obvious wheeze.  Chest pain workup was negative.  She was admitted for COPD exacerbation and treated with nebulized bronchodilators and systemic steroids.  She was also found to be tachycardic and cardiology was consulted.  This is felt to represent multifocal atrial tachycardia and she was started on diltiazem and metoprolol with rate control achieved.  Despite the aforementioned therapies it is felt as though she is been slow to improve and PCCM has been consulted on 3/5.  SIGNIFICANT EVENTS  3/1 admit for COPD exacerbation 3/3 dilt started but HR remained high 3/4 Metoprolol started 3/5 Pulm consult  STUDIES:   SUBJECTIVE:  Much improved this AM.  No complaints.  VITAL SIGNS: Temp:  [97.3 F (36.3 C)-98 F (36.7 C)] 97.5 F (36.4 C) (03/06 0700) Pulse Rate:  [31-94] 77 (03/06 0700) Resp:  [14-30] 18 (03/06 0700) BP: (92-153)/(30-119) 122/72 (03/06 0700) SpO2:  [92 %-98 %] 96 % (03/06 0732) Weight:  [82.9 kg (182 lb 12.2 oz)] 82.9 kg (182 lb 12.2 oz) (03/06 0500)  PHYSICAL EXAMINATION: General:  Chronically ill appearing elderly female, resting in bed watching TV, in NAD Neuro:  Alert, oriented, non-focal HEENT:  [place]/AT, EOMI, no JVD, Cardiovascular:  RRR, no MRG Lungs:  Poor air movement. No wheeze.  Abdomen:  Soft,  non-tender, non-distended Musculoskeletal:  No acute deformity or ROM limitation.  Skin:  Grossly intact  Recent Labs  Lab 08/08/17 2350 08/09/17 0347 08/10/17 0212  NA 141 143 139  K 3.7 3.6 4.1  CL 102 104 101  CO2 28 28 31   BUN 10 9 14   CREATININE 0.70 0.74 0.77  GLUCOSE 120* 192* 177*   Recent Labs  Lab 08/08/17 2350 08/09/17 0347  HGB 15.2* 14.0  HCT 48.1* 44.9  WBC 7.8 8.3  PLT 228 210   No results found.  ASSESSMENT / PLAN:  Discussion: 84 year old female with known severe COPD. On budesonide and scheduled duoneb at home. At baseline she has very poor activity tolerance. Takes her 3-4 hours to get ready for a doctors appt. She feels much better since coming in and feels like she is essentially at her baseline, but she is not given sufficient time to recover her breathing with how often hospital staff are working with her. She wants to go home.   COPD with acute exacerbation - Continue steroids with gradual taper over 2 weeks - Continue budesonide and duoneb scheduled. This is essentially her home regimen - PRN albuterol neb - Not much more we could offer her besides chronic steroids at this point  - Will need quick outpatient follow up post discharge  Acute on chronic hypoxemic respiratory failure - Continue supplemental O2 to keep SpO2 88-92%  Tachycardia; MAT - Per primary, cardiology  Global: discussed severity  and progression of COPD. She is clear she would never want to be on life support under any circumstances. I asked if she wanted to discuss this with her daughter before making any changes in the health record. She says no. Will change code status to DNR.    Montey Hora, [facility] Pager: [phone]  or [phone] 08/13/2017, 11:27 AM

## 2017-08-13 NOTE — Progress Notes (Signed)
Patient ID: [name], female   DOB: 1932/05/01, 84 y.o.   MRN: [id number]  PROGRESS NOTE    ADRYANA MOGENSEN  UYQ:[id number] DOB: 08/23/1931 DOA: 08/08/2017 PCP: Patient, No Pcp Per   Brief Narrative:  Patient is an 84 year old female with history of COPD on home oxygen who came into the ER with 24 hours for severe shortness of breath and cough. Symptoms did not respond to home medications.  She started having left arm pain as well as sinus tachycardia with mild chest discomfort.  Patient subsequently admitted with COPD exacerbation as well as chest pain with supraventricular tachycardia. Has improved and seen by Pulmonary.  Assessment & Plan:   Principal Problem:   COPD with acute exacerbation ([place]) Active Problems:   Essential hypertension   Left arm pain   Abdominal fibromatosis   #1 acute on chronic respiratory failure: Secondary to COPD exacerbation.  Patient's symptoms have improved today. She becomes hypoxemic with slight movement and also tachycardic.  She is still on oxygen per home dose.  We will continue per pulmonary.  Continue antibiotics with steroids and nebulizer. No change in home regimen.  #2 sinus tachycardia: patient has been evaluated by cardiology.  She has been on Cardizem and now metoprolol added.  Heart rate is slowly improving. Still tachycardic with activities however. Patient very deconditioned but does not want to go to skilled facility  #3 left arm pain: Cardiac workup completed.  No evidence of ischemia.  #4 hypertension: Blood pressure appears better controlled  Continue monitoring.  #5 hyperlipidemia: Continue statin  #6 generalized weakness: Patient being seen by physical therapy and occupational therapy. She will likely go home with physical therapy in the morning   DVT prophylaxis: Lovenox Code Status: full Family Communication: None at bedside Disposition Plan: to be determined   Consultants:   Cardiology Dr Wynonia Lawman  Procedures:  None  Antimicrobials:  azithromycin  Subjective: Patient is still breathing hard with significant hypoxia and tachycardia with little activity  Objective: Vitals:   08/13/17 1400 08/13/17 1500 08/13/17 1600 08/13/17 1700  BP: (!) 130/37 (!) 145/89 (!) 125/33 (!) 153/86  Pulse: (!) 42 67 60 83  Resp: 18 (!) 25 20 (!) 31  Temp:  98.1 F (36.7 C)    TempSrc:  Oral    SpO2: 94% 94% 96% 97%  Weight:      Height:        Intake/Output Summary (Last 24 hours) at 08/13/2017 1846 Last data filed at 08/13/2017 1700 Gross per 24 hour  Intake 600 ml  Output -  Net 600 ml   Filed Weights   08/10/17 0500 08/12/17 0405 08/13/17 0500  Weight: 82.1 kg (180 lb 16 oz) 83.3 kg (183 lb 10.3 oz) 82.9 kg (182 lb 12.2 oz)    Examination:  General exam: Appears calm and comfortable  Respiratory system: Clear to auscultation. Respiratory effort normal. Cardiovascular system: S1 & S2 heard, RRR. No JVD, murmurs, rubs, gallops or clicks. No pedal edema. Gastrointestinal system: Abdomen is nondistended, soft and nontender. No organomegaly or masses felt. Normal bowel sounds heard. Central nervous system: Alert and oriented. No focal neurological deficits. Extremities: Symmetric 5 x 5 power. Skin: No rashes, lesions or ulcers Psychiatry: Judgement and insight appear normal. Mood & affect appropriate.     Data Reviewed: I have personally reviewed following labs and imaging studies  CBC: Recent Labs  Lab 08/08/17 2350 08/09/17 0347  WBC 7.8 8.3  NEUTROABS 3.4  --   HGB 15.2*  14.0  HCT 48.1* 44.9  MCV 100.2* 100.2*  PLT 228 536   Basic Metabolic Panel: Recent Labs  Lab 08/08/17 2350 08/09/17 0347 08/09/17 1000 08/10/17 0212  NA 141 143  --  139  K 3.7 3.6  --  4.1  CL 102 104  --  101  CO2 28 28  --  31  GLUCOSE 120* 192*  --  177*  BUN 10 9  --  14  CREATININE 0.70 0.74  --  0.77  CALCIUM 8.7* 8.5*  --  8.2*  MG  --   --  1.6* 2.2   GFR: Estimated Creatinine Clearance: 56.7  mL/min (by C-G formula based on SCr of 0.77 mg/dL). Liver Function Tests: Recent Labs  Lab 08/08/17 2350  AST 17  ALT 11*  ALKPHOS 72  BILITOT 1.0  PROT 7.2  ALBUMIN 3.6   No results for input(s): LIPASE, AMYLASE in the last 168 hours. No results for input(s): AMMONIA in the last 168 hours. Coagulation Profile: No results for input(s): INR, PROTIME in the last 168 hours. Cardiac Enzymes: Recent Labs  Lab 08/08/17 2350 08/09/17 0347 08/09/17 1000 08/09/17 1524  TROPONINI <0.03 <0.03 <0.03 <0.03   BNP (last 3 results) No results for input(s): PROBNP in the last 8760 hours. HbA1C: No results for input(s): HGBA1C in the last 72 hours. CBG: No results for input(s): GLUCAP in the last 168 hours. Lipid Profile: No results for input(s): CHOL, HDL, LDLCALC, TRIG, CHOLHDL, LDLDIRECT in the last 72 hours. Thyroid Function Tests: No results for input(s): TSH, T4TOTAL, FREET4, T3FREE, THYROIDAB in the last 72 hours. Anemia Panel: No results for input(s): VITAMINB12, FOLATE, FERRITIN, TIBC, IRON, RETICCTPCT in the last 72 hours. Urine analysis:    Component Value Date/Time   COLORURINE YELLOW 11/14/2014 2021   APPEARANCEUR CLEAR 11/14/2014 2021   LABSPEC 1.004 (L) 11/14/2014 2021   PHURINE 5.5 11/14/2014 2021   GLUCOSEU NEGATIVE 11/14/2014 2021   HGBUR NEGATIVE 11/14/2014 2021   [place] NEGATIVE 11/14/2014 2021   KETONESUR NEGATIVE 11/14/2014 2021   PROTEINUR NEGATIVE 11/14/2014 2021   UROBILINOGEN 1.0 11/14/2014 2021   NITRITE NEGATIVE 11/14/2014 2021   LEUKOCYTESUR NEGATIVE 11/14/2014 2021   Sepsis Labs: @LABRCNTIP (procalcitonin:4,lacticidven:4)  ) Recent Results (from the past 240 hour(s))  MRSA PCR Screening     Status: None   Collection Time: 08/09/17  3:42 PM  Result Value Ref Range Status   MRSA by PCR NEGATIVE NEGATIVE Final    Comment:        The GeneXpert MRSA Assay (FDA approved for NASAL specimens only), is one component of a comprehensive MRSA  colonization surveillance program. It is not intended to diagnose MRSA infection nor to guide or monitor treatment for MRSA infections. Performed at [facility], [place] [address], [place], [place] [address]          Radiology Studies: No results found.      Scheduled Meds: . aspirin EC  81 mg Oral Daily  . budesonide  0.5 mg Nebulization BID  . diltiazem  60 mg Oral Q12H  . diltiazem  10 mg Intravenous Once  . enoxaparin (LOVENOX) injection  40 mg Subcutaneous Q24H  . guaiFENesin  600 mg Oral Daily  . mouth rinse  15 mL Mouth Rinse BID  . metoprolol tartrate  12.5 mg Oral BID  . predniSONE  50 mg Oral Q breakfast  . roflumilast  500 mcg Oral Daily  . rosuvastatin  20 mg Oral Daily  Continuous Infusions:   LOS: 4 days    Time spent:25 Harriett Sine, MD [facility] Pager [phone]   If 7PM-7AM, please contact night-coverage [email or web address] Password [facility] 08/13/2017, 6:46 PM

## 2017-08-13 NOTE — Evaluation (Signed)
Physical Therapy Evaluation Patient Details Name: [name] MRN: [id number] DOB: August 11, 1931 Today's Date: 08/13/2017   History of Present Illness  Pt is an 84 y/o female admitted secondary to SOB and L UE pain. Pt found to have an acute COPD exacerbation. PMH including but not limited to COPD (on home O2), HTN and HLD.     Clinical Impression  Pt presented supine in bed with HOB elevated, awake and willing to participate in therapy session. Prior to admission, pt reported that she was independent with all functional mobility and ADLs. Pt lives with her daughter who works during the day, at which time her son is in the home with her until her daughter returns. Pt currently very limited secondary to fatigue and poor cardiopulmonary tolerance. Pt performed bed mobility with supervision, transfers with min A and ambulated a very short distance within her room with RW and min guard to min A. Of note, pt's HR increased to as high as 151bpm and SPO2 decreased to as low as 87% with activity on 4L of supplemental O2.   Pt would continue to benefit from skilled physical therapy services at this time while admitted and after d/c to address the below listed limitations in order to improve overall safety and independence with functional mobility.     Follow Up Recommendations SNF;Supervision/Assistance - 24 hour;Other (comment)(pt likely to refuse, wanting to return home with HHPT)    Equipment Recommendations  Other (comment);3in1 (PT)(rollator)    Recommendations for Other Services       Precautions / Restrictions Precautions Precautions: Fall Precaution Comments: watch HR, SPO2 Restrictions Weight Bearing Restrictions: No      Mobility  Bed Mobility Overal bed mobility: Needs Assistance Bed Mobility: Supine to Sit     Supine to sit: Supervision     General bed mobility comments: supervision for safety, increased time  Transfers Overall transfer level: Needs assistance Equipment  used: None;Rolling walker (2 wheeled) Transfers: Sit to/from [facility] Sit to Stand: Min assist Stand pivot transfers: Min assist       General transfer comment: pt unsteady with transitional movement and needing min A  Ambulation/Gait Ambulation/Gait assistance: Min guard;Min assist Ambulation Distance (Feet): 10 Feet Assistive device: Rolling walker (2 wheeled) Gait Pattern/deviations: Step-through pattern;Decreased step length - right;Decreased step length - left;Decreased stride length Gait velocity: decreased Gait velocity interpretation: Below normal speed for age/gender General Gait Details: pt very limited secondary to rapid fatigue; pt ambulated with RW and min guard with occasional min A secondary to unsteadiness  Stairs            Wheelchair Mobility    Modified Rankin (Stroke Patients Only)       Balance Overall balance assessment: Needs assistance Sitting-balance support: Feet supported Sitting balance-Leahy Scale: Good     Standing balance support: During functional activity;Bilateral upper extremity supported Standing balance-Leahy Scale: Poor Standing balance comment: reliant on UE supports                             Pertinent Vitals/Pain Pain Assessment: No/denies pain    Home Living Family/patient expects to be discharged to:: Private residence Living Arrangements: Children Available Help at Discharge: Family;Available 24 hours/day Type of Home: Apartment Home Access: Level entry     Home Layout: Two level Home Equipment: Shower seat;Cane - single point      Prior Function Level of Independence: Independent  Hand Dominance        Extremity/Trunk Assessment   Upper Extremity Assessment Upper Extremity Assessment: Overall WFL for tasks assessed    Lower Extremity Assessment Lower Extremity Assessment: Overall WFL for tasks assessed       Communication   Communication: No  difficulties  Cognition Arousal/Alertness: Awake/alert Behavior During Therapy: WFL for tasks assessed/performed Overall Cognitive Status: Impaired/Different from baseline Area of Impairment: Safety/judgement;Problem solving                         Safety/Judgement: Decreased awareness of deficits;Decreased awareness of safety   Problem Solving: Difficulty sequencing;Requires verbal cues        General Comments      Exercises     Assessment/Plan    PT Assessment Patient needs continued PT services  PT Problem List Decreased activity tolerance;Decreased balance;Decreased mobility;Decreased coordination;Decreased knowledge of use of DME;Decreased safety awareness;Decreased knowledge of precautions;Cardiopulmonary status limiting activity       PT Treatment Interventions DME instruction;Gait training;Functional mobility training;Therapeutic activities;Stair training;Therapeutic exercise;Balance training;Neuromuscular re-education;Patient/family education    PT Goals (Current goals can be found in the Care Plan section)  Acute Rehab PT Goals Patient Stated Goal: return home PT Goal Formulation: With patient Time For Goal Achievement: 08/27/17 Potential to Achieve Goals: Good    Frequency Min 3X/week   Barriers to discharge        Co-evaluation               AM-PAC PT "6 Clicks" Daily Activity  Outcome Measure Difficulty turning over in bed (including adjusting bedclothes, sheets and blankets)?: None Difficulty moving from lying on back to sitting on the side of the bed? : None Difficulty sitting down on and standing up from a chair with arms (e.g., wheelchair, bedside commode, etc,.)?: Unable Help needed moving to and from a bed to chair (including a wheelchair)?: A Little Help needed walking in hospital room?: A Little Help needed climbing 3-5 steps with a railing? : A Lot 6 Click Score: 17    End of Session Equipment Utilized During Treatment:  Oxygen Activity Tolerance: Patient limited by fatigue Patient left: in bed;with call bell/phone within reach;with bed alarm set;Other (comment)(sitting EOB) Nurse Communication: Mobility status PT Visit Diagnosis: Other abnormalities of gait and mobility (R26.89);Unsteadiness on feet (R26.81)    Time: 0539-7673 PT Time Calculation (min) (ACUTE ONLY): 27 min   Charges:   PT Evaluation $PT Eval Moderate Complexity: 1 Mod PT Treatments $Therapeutic Activity: 8-22 mins   PT G Codes:        Gary, PT, DPT [place] 08/13/2017, 3:17 PM

## 2017-08-13 NOTE — Progress Notes (Addendum)
Per attending MD, pt needs to be evaluated by PT before d/c w/ Home Health. Unable to eval yesterday due to HR and RR. Paged covering PT who states she will see pt today

## 2017-08-14 LAB — BASIC METABOLIC PANEL
Anion gap: 9 (ref 5–15)
BUN: 22 mg/dL — AB (ref 6–20)
CALCIUM: 8.4 mg/dL — AB (ref 8.9–10.3)
CO2: 35 mmol/L — ABNORMAL HIGH (ref 22–32)
CREATININE: 0.74 mg/dL (ref 0.44–1.00)
Chloride: 99 mmol/L — ABNORMAL LOW (ref 101–111)
GFR calc Af Amer: >60 mL/min (ref >60)
GLUCOSE: 94 mg/dL (ref 65–99)
Potassium: 3.8 mmol/L (ref 3.5–5.1)
Sodium: 143 mmol/L (ref 135–145)

## 2017-08-14 LAB — CBC
HCT: 49.1 % — ABNORMAL HIGH (ref 36.0–46.0)
Hemoglobin: 15.2 g/dL — ABNORMAL HIGH (ref 12.0–15.0)
MCH: 31.4 pg (ref 26.0–34.0)
MCHC: 31 g/dL (ref 30.0–36.0)
MCV: 101.4 fL — ABNORMAL HIGH (ref 78.0–100.0)
PLATELETS: 269 10*3/uL (ref 150–400)
RBC: 4.84 MIL/uL (ref 3.87–5.11)
RDW: 12.7 % (ref 11.5–15.5)
WBC: 7.2 10*3/uL (ref 4.0–10.5)

## 2017-08-14 MED ORDER — PREDNISONE 10 MG PO TABS: ORAL_TABLET | ORAL | 0 refills | Status: DC

## 2017-08-14 MED ORDER — METOPROLOL TARTRATE 25 MG PO TABS: 12.5000 mg | ORAL_TABLET | Freq: Two times a day (BID) | ORAL | 0 refills | Status: AC

## 2017-08-14 MED ORDER — SALINE SPRAY 0.65 % NA SOLN
2.0000 | Freq: Two times a day (BID) | NASAL | Status: DC
  Administered 2017-08-14: 2 via NASAL
  Filled 2017-08-14: qty 44

## 2017-08-14 MED ORDER — ONDANSETRON HCL 4 MG PO TABS: 4.0000 mg | ORAL_TABLET | Freq: Four times a day (QID) | ORAL | 0 refills | Status: DC | PRN

## 2017-08-14 MED ORDER — AZITHROMYCIN 500 MG PO TABS: 500.0000 mg | ORAL_TABLET | Freq: Every day | ORAL | 0 refills | Status: AC

## 2017-08-14 MED ORDER — FLUTICASONE PROPIONATE 50 MCG/ACT NA SUSP
1.0000 | Freq: Every day | NASAL | Status: DC
  Administered 2017-08-14: 1 via NASAL
  Filled 2017-08-14: qty 16

## 2017-08-14 NOTE — Progress Notes (Signed)
1225: Contacted by [facility] in regards to azithromycin prescription, verified that AVS reads 3 tablets.  1231: Telephoned patients home, call answered by James-patients son, he states that either Jenny Reichmann or Vaughan Basta will pick up patient within the next few hours. This RN advised that family will need to provide oxygen for patient to transport home with, Jeneen Rinks verbalized understanding.  1345: discharge instructions reviewed with patient, patient verbalize understanding. Will review again when daughter arrives to take home. Patient dc'd from tele, IV removed. MD, Soot at bedside, VSS, no c/o pain. Awaiting arrival of rolling Dajaun Goldring and patients daughter so that she can go home.

## 2017-08-14 NOTE — Progress Notes (Signed)
Physical Therapy Treatment Patient Details Name: [name] MRN: [id number] DOB: [birth year] Today's Date: 08/14/2017    History of Present Illness Pt is an 84 y/o female admitted secondary to SOB and L UE pain. Pt found to have an acute COPD exacerbation. PMH including but not limited to COPD (on home O2), HTN and HLD.     PT Comments    Pt was seen for shorter session as she had just been assisted to [facility] and was too SOB to attempt gait.  Did do well with exercises and her effort was good.  Had one episode of pulse up to 166, and O2 sats were as low as 88% with effort to work.  Will continue to work on mobility as tolerated to shorten stay need for SNF.   Follow Up Recommendations  SNF     Equipment Recommendations  3in1 (PT)    Recommendations for Other Services       Precautions / Restrictions Precautions Precautions: Fall Precaution Comments: watch HR, SPO2 Restrictions Weight Bearing Restrictions: No    Mobility  Bed Mobility Overal bed mobility: Needs Assistance Bed Mobility: Rolling Rolling: Min guard;Min assist(scooting up bed with supervision to maintain herbalance)         General bed mobility comments: SOB when PT arrived  Transfers                 General transfer comment: unable as she had just gotten up to [facility]  Ambulation/Gait                 Stairs            Wheelchair Mobility    Modified Rankin (Stroke Patients Only)       Balance Overall balance assessment: Needs assistance                                          Cognition Arousal/Alertness: Awake/alert Behavior During Therapy: WFL for tasks assessed/performed Overall Cognitive Status: Impaired/Different from baseline Area of Impairment: Safety/judgement;Problem solving                         Safety/Judgement: Decreased awareness of safety;Decreased awareness of deficits   Problem Solving: Difficulty sequencing;Requires verbal  cues;Decreased initiation General Comments: reminders for all exercise techniques but is willing to follow instructions      Exercises General Exercises - Lower Extremity Ankle Circles/Pumps: AROM;AAROM;Both;5 reps Quad Sets: AROM;Both;10 reps Gluteal Sets: AROM;Both;10 reps Heel Slides: AROM;Both;10 reps Hip ABduction/ADduction: AAROM;Both;20 reps Straight Leg Raises: AROM;AAROM;Both;10 reps    General Comments        Pertinent Vitals/Pain Pain Assessment: No/denies pain    Home Living                      Prior Function            PT Goals (current goals can now be found in the care plan section) Acute Rehab PT Goals Patient Stated Goal: return home Progress towards PT goals: Progressing toward goals(lesser function as pt has just gotten OOB)    Frequency    Min 3X/week      PT Plan Current plan remains appropriate    Co-evaluation              AM-PAC PT "6 Clicks" Daily Activity  Outcome Measure  Difficulty turning over  in bed (including adjusting bedclothes, sheets and blankets)?: A Little Difficulty moving from lying on back to sitting on the side of the bed? : A Little Difficulty sitting down on and standing up from a chair with arms (e.g., wheelchair, bedside commode, etc,.)?: Unable Help needed moving to and from a bed to chair (including a wheelchair)?: A Lot Help needed walking in hospital room?: A Lot Help needed climbing 3-5 steps with a railing? : A Lot 6 Click Score: 13    End of Session Equipment Utilized During Treatment: Oxygen Activity Tolerance: Patient limited by fatigue Patient left: in bed;with call bell/phone within reach;with bed alarm set Nurse Communication: Mobility status PT Visit Diagnosis: Other abnormalities of gait and mobility (R26.89);Unsteadiness on feet (R26.81)     Time: 0211-1552 PT Time Calculation (min) (ACUTE ONLY): 17 min  Charges:  $Therapeutic Exercise: 8-22 mins                    G Codes:   Functional Assessment Tool Used: AM-PAC 6 Clicks Basic Mobility     Ramond Dial 08/14/2017, 1:22 PM   Mee Hives, PT MS Acute Rehab Dept. Number: [place]

## 2017-08-14 NOTE — Discharge Summary (Signed)
Physician Discharge Summary  [name]:[id number] DOB: Oct 08, 1931 DOA: 08/08/2017  PCP: Patient, No Pcp Per  Admit date: 08/08/2017 Discharge date: 08/14/2017  Time spent: 35 minutes  Recommendations for Outpatient Follow-up:  1. Follow-up post pulmonary critical care as scheduled  2. Complete prednisone dose taper and antibiotics  3. Follow-up with primary care physician.   Discharge Diagnoses:  Principal Problem:   COPD with acute exacerbation [facility]) Active Problems:   Essential hypertension   Left arm pain   Abdominal fibromatosis   Discharge Condition: Fair  Diet recommendation: Heart healthy  Filed Weights   08/12/17 0405 08/13/17 0500 08/14/17 0402  Weight: 83.3 kg (183 lb 10.3 oz) 82.9 kg (182 lb 12.2 oz) 83.2 kg (183 lb 6.8 oz)    History of present illness:   Patient is an 84 year old female with history of COPD on home oxygen who came into the ER with 24 hours for severe shortness of breath and cough. Symptoms did not respond to home medications.  She started having left arm pain as well as sinus tachycardia with mild chest discomfort.  Patient subsequently admitted with COPD exacerbation as well as chest pain with supraventricular tachycardia. Has improved and seen by Pulmonary.   Hospital Course:  Patient was admitted with acute on chronic respiratory failure but also significant sinus tachycardia consistent with multiple atrial tachycardia, MAT. She had multiple other medical problems including left arm pain suspected to be angina equivalent. She was seen by cardiology and workup for acute coronary syndrome was completed and was negative. Subsequently patient had pulmonary consultation. She was following up with pulmonology in the outpatient setting. Recommendations when noted no significant change but she is quite to follow-up in the outpatient setting with pulmonology again. We have offered patient skilled nursing facility but she declines. Physical therapy  or occupational therapy have seen the patient at this point she will be discharged home with home health PT and OT. Also respiratory therapy. She is continuing to take prednisone taper dose with antibiotics and nebulizer at home. Patient also had cardiac medications adjusted. She is on both Cardizem and metoprolol.  Procedures:  None  Consultations:  Cardiology: Dr Skeet Latch  Pulmonary: Dr Marlise Eves  Discharge Exam: Vitals:   08/14/17 0721 08/14/17 0812  BP: (!) 127/54   Pulse: (!) 51   Resp: (!) 23   Temp: 97.6 F (36.4 C)   SpO2: 98% 96%    General: Stable, weak but no acute distress Cardiovascular: Regular rate and rhythm Respiratory: Good air entry bilaterally with some mild extremity wheezing  Discharge Instructions    Allergies as of 08/14/2017   No Known Allergies     Medication List    TAKE these medications   albuterol (2.5 MG/3ML) 0.083% nebulizer solution Commonly known as:  PROVENTIL Take 3 mLs (2.5 mg total) by nebulization every 4 (four) hours as needed. DX J44.9   albuterol 108 (90 Base) MCG/ACT inhaler Commonly known as:  PROAIR HFA INHALE TWO PUFFS BY MOUTH EVERY 6 HOURS AS NEEDED FOR WHEEZING OR SHORTNESS OF BREATH   aspirin EC 81 MG tablet Take 81 mg by mouth daily as needed for moderate pain.   azithromycin 500 MG tablet Commonly known as:  ZITHROMAX Take 1 tablet (500 mg total) by mouth daily for 3 days. Take 1 tablet daily for 3 days.   budesonide 0.5 MG/2ML nebulizer solution Commonly known as:  PULMICORT Take 2 mLs (0.5 mg total) by nebulization 2 (two) times daily.  diltiazem 60 MG tablet Commonly known as:  CARDIZEM Take 60 mg by mouth 2 (two) times daily.   guaiFENesin 600 MG 12 hr tablet Commonly known as:  MUCINEX Take 600 mg by mouth daily.   ipratropium-albuterol 0.5-2.5 (3) MG/3ML Soln Commonly known as:  DUONEB Take 3 mLs by nebulization 4 (four) times daily.   MUCINEX FAST-MAX 10-650-400 MG/20ML  Liqd Generic drug:  Phenylephrine-APAP-guaiFENesin Take 20 mLs by mouth every 6 (six) hours as needed (congestion).   ondansetron 4 MG tablet Commonly known as:  ZOFRAN Take 1 tablet (4 mg total) by mouth every 6 (six) hours as needed for nausea.   predniSONE 10 MG tablet Commonly known as:  DELTASONE 40 mg BID x 3 days then 20 mg BID x 3 days then 10 mg BID x 3 days then 10 mg QD x 3 days   rosuvastatin 20 MG tablet Commonly known as:  CRESTOR Take 1 tablet (20 mg total) by mouth daily.   tetrahydrozoline 0.05 % ophthalmic solution Place 1 drop into both eyes as needed (dry eyes).            Durable Medical Equipment  (From admission, onward)        Start     Ordered   08/14/17 1136  For home use only DME Walker rolling  Once    Comments:  Rollator  Question Answer Comment  Patient needs a walker to treat with the following condition Other abnormalities of gait and mobility   Patient needs a walker to treat with the following condition Unsteadiness on feet      08/14/17 1137     No Known Allergies Follow-up Information    [facility].   Specialty:  [place] Why:  will call you to set up visits Contact information: [place] [address] [place]. Call.   Why:  Can establish PCP Contact information: [place] [phone] [place]. Call.   Why:  Can establish PCP Contact information: [address] [place] [phone]           The results of significant diagnostics from this hospitalization (including imaging, microbiology, ancillary and laboratory) are listed below for reference.    Significant Diagnostic Studies: Dg Chest 2 View  Result Date: 08/09/2017 CLINICAL DATA:  Chest pain and shortness of breath. EXAM: CHEST  2  VIEW COMPARISON:  Radiograph 03/26/2017 FINDINGS: Significant patient rotation limits assessment. The heart is enlarged, similar to prior exam allowing for differences in technique. Tortuosity of the thoracic aorta. Chronic hyperinflation and interstitial coarsening. Scarring in bilateral mid lung zones. No pulmonary edema. No pneumothorax or large pleural effusion. Chronic deformity of posterior left ribs. IMPRESSION: Rotated exam with stable cardiomegaly and chronic hyperinflation. No definite acute abnormality. Electronically Signed   By: Jeb Levering M.D.   On: 08/09/2017 00:23    Microbiology: Recent Results (from the past 240 hour(s))  MRSA PCR Screening     Status: None   Collection Time: 08/09/17  3:42 PM  Result Value Ref Range Status   MRSA by PCR NEGATIVE NEGATIVE Final    Comment:        The GeneXpert MRSA Assay (FDA approved for NASAL specimens only), is one component of a comprehensive MRSA colonization surveillance program. It is not intended to diagnose  MRSA infection nor to guide or monitor treatment for MRSA infections. Performed at [facility], [place] [address], [place], [place] [address]      Labs: Basic Metabolic Panel: Recent Labs  Lab 08/08/17 2350 08/09/17 0347 08/09/17 1000 08/10/17 0212 08/14/17 0818  NA 141 143  --  139 143  K 3.7 3.6  --  4.1 3.8  CL 102 104  --  101 99*  CO2 28 28  --  31 35*  GLUCOSE 120* 192*  --  177* 94  BUN 10 9  --  14 22*  CREATININE 0.70 0.74  --  0.77 0.74  CALCIUM 8.7* 8.5*  --  8.2* 8.4*  MG  --   --  1.6* 2.2  --    Liver Function Tests: Recent Labs  Lab 08/08/17 2350  AST 17  ALT 11*  ALKPHOS 72  BILITOT 1.0  PROT 7.2  ALBUMIN 3.6   No results for input(s): LIPASE, AMYLASE in the last 168 hours. No results for input(s): AMMONIA in the last 168 hours. CBC: Recent Labs  Lab 08/08/17 2350 08/09/17 0347 08/14/17 0818  WBC 7.8 8.3 7.2  NEUTROABS 3.4  --   --   HGB 15.2* 14.0 15.2*  HCT 48.1*  44.9 49.1*  MCV 100.2* 100.2* 101.4*  PLT 228 210 269   Cardiac Enzymes: Recent Labs  Lab 08/08/17 2350 08/09/17 0347 08/09/17 1000 08/09/17 1524  TROPONINI <0.03 <0.03 <0.03 <0.03   BNP: BNP (last 3 results) Recent Labs    08/08/17 2350  BNP 121.3*    ProBNP (last 3 results) No results for input(s): PROBNP in the last 8760 hours.  CBG: No results for input(s): GLUCAP in the last 168 hours.     SignedBarbette Merino MD.  [facility] 08/14/2017, 11:38 AM

## 2017-08-14 NOTE — Progress Notes (Signed)
PCCM Progress Note  Subjective: Breathing better.  Vitals: BP (!) 112/38   Pulse (!) 50   Temp 98.2 F (36.8 C) (Oral)   Resp 18   Ht 5\' 5"  (1.651 m)   Wt 183 lb 6.8 oz (83.2 kg)   SpO2 95%   BMI 30.52 kg/m    General - pleasant Eyes - pupils reactive ENT - no sinus tenderness, no oral exudate, no LAN Cardiac - regular, no murmur Chest - no wheeze, rales Abd - soft, non tender Ext - no edema Skin - no rashes Neuro - normal strength Psych - normal mood  CMP Latest Ref Rng & Units 08/14/2017 08/10/2017 08/09/2017  Glucose 65 - 99 mg/dL 94 177(H) 192(H)  BUN 6 - 20 mg/dL 22(H) 14 9  Creatinine 0.44 - 1.00 mg/dL 0.74 0.77 0.74  Sodium 135 - 145 mmol/L 143 139 143  Potassium 3.5 - 5.1 mmol/L 3.8 4.1 3.6  Chloride 101 - 111 mmol/L 99(L) 101 104  CO2 22 - 32 mmol/L 35(H) 31 28  Calcium 8.9 - 10.3 mg/dL 8.4(L) 8.2(L) 8.5(L)  Total Protein 6.5 - 8.1 g/dL - - -  Total Bilirubin 0.3 - 1.2 mg/dL - - -  Alkaline Phos 38 - 126 U/L - - -  AST 15 - 41 U/L - - -  ALT 14 - 54 U/L - - -   CBC Latest Ref Rng & Units 08/14/2017 08/09/2017 08/08/2017  WBC 4.0 - 10.5 K/uL 7.2 8.3 7.8  Hemoglobin 12.0 - 15.0 g/dL 15.2(H) 14.0 15.2(H)  Hematocrit 36.0 - 46.0 % 49.1(H) 44.9 48.1(H)  Platelets 150 - 400 K/uL 269 210 228   Assessment/plan:  Acute COPD exacerbation. Acute on chronic respiratory failure. - wean prednisone - continue daliresp - continue BDs - oxygen to keep SpO2 88 to 95%  Upper airway cough. - nasal irrigation and flonase  Goals of care - DNR  She has f/u with Dr. Lamonte Sakai on 09/08/17.  Chesley Mires, MD [facility] Pulmonary/Critical Care 08/14/2017, 1:35 PM Pager:  [phone] After 3pm call: [phone]

## 2017-08-14 NOTE — Care Management Note (Addendum)
Case Management Note  Patient Details  Name: [name] MRN: [id number] Date of Birth: 1932-02-24  Action/Plan: In to speak with patient.  Discussed recommendations of Home Health PT/OT/RN, patient offered choice and selected [facility]; wants to remain with [facility] for DME: oxygen.  States she is active with them for DME: oxygen, will like to continue using them. Home Dme includeds: 3-in1.  Discussed recommendation of DME : Rollator and patient wants item.  Arranged for DME: rollator to be delivered to patient room prior to discharge.Referral for DME: Rollator called to [facility] with [facility] and accepted. Referral for HH: PT/OT/RN called to [facility] discharge transition liaison with [facility] Also discussed that patient has no pcp/offered to set-up appointment to establish care.  Patient declined appointment assistants and would like some names only.  NCM located (2) providers for patient to pick from to establish primary care provider; listed on discharge summary and patient made aware. Verbalized understanding. Prior to admission patient lived at home.  Will be returning to the same living situation after discharge.  At discharge, patient has transportation home (daughter). Patient has the ability to pay for your medication/food.  Daughter does shopping and able to take patient to medical appointments.  Expected Discharge Date:   08/14/2017               Expected Discharge Plan:  [place]  Discharge planning Services  CM Consult  Choice offered to:  Patient  HH Arranged:  PT, OT, RN [facility] Agency:    [place].  Status of Service:  Completed, signed off  Kristen Cardinal, RN  Nurse case [place] 08/14/2017, 11:16 AM

## 2017-08-18 ENCOUNTER — Telehealth: Payer: Self-pay | Admitting: Internal Medicine

## 2017-08-18 NOTE — Telephone Encounter (Signed)
New Message    Needs an order for speech therapy - can you follow this patient until she is established with a PCP

## 2017-08-25 NOTE — Telephone Encounter (Signed)
Called [facility] care service. Advised that pt was last admitted for COPD exacerbation.  She currently uses [facility] for O2 supplies.   Will review with Dr. Harrington Challenger when she returns to office.

## 2017-08-26 NOTE — Telephone Encounter (Signed)
Follow-up   Izora Gala is calling to check in on whether Dr. Harrington Challenger will sign health orders for Nursing, PT, and OT. Please call back at [phone]

## 2017-08-26 NOTE — Telephone Encounter (Signed)
Will forward to Dr. Ross and her nurse. 

## 2017-08-28 ENCOUNTER — Telehealth: Payer: Self-pay | Admitting: Emergency Medicine

## 2017-08-28 NOTE — Telephone Encounter (Signed)
Spoke with nurse manager at [facility], advised that Dr. Harrington Challenger recommends patient obtain a PCP and have home care orders managed by PCP, or possibly by pulmonary.

## 2017-08-28 NOTE — Telephone Encounter (Signed)
Called and spoke to [place] with [facility]. Jenny Reichmann is requesting verbal order for home health nurse once weekly for the next three weeks.  RB please advise. Thanks.

## 2017-08-29 NOTE — Telephone Encounter (Signed)
Provided [name] with verbal for home health nurse. Nothing further is needed.

## 2017-08-29 NOTE — Telephone Encounter (Signed)
Ok to order 

## 2017-09-03 ENCOUNTER — Telehealth: Payer: Self-pay | Admitting: Emergency Medicine

## 2017-09-03 NOTE — Telephone Encounter (Signed)
Called and spoke with [facility] letting her know it was okay for [name] to give the verbal order for pt to do PT.  Kaitlyn expressed understanding. Nothing further needed at this time.

## 2017-09-03 NOTE — Telephone Encounter (Signed)
OK to order 

## 2017-09-03 NOTE — Telephone Encounter (Signed)
Spoke with [facility]. She was asking for verbal orders for the patient for the next 8 weeks, twice weekly. Explained to her that the patient is a former [facility] pt and has not met RB yet, therefore I will need to ask him first. She verbalized understanding.   RB, patient is scheduled to see you on 09/11/17. Please advise if you are ok with verbal orders for PT. Thanks!

## 2017-09-08 ENCOUNTER — Encounter: Payer: Self-pay | Admitting: Emergency Medicine

## 2017-09-08 ENCOUNTER — Ambulatory Visit: Payer: Medicare Other | Admitting: Emergency Medicine

## 2017-09-08 VITALS — BP 138/82 | HR 83 | Ht 65.0 in | Wt 185.0 lb

## 2017-09-08 DIAGNOSIS — I471 Supraventricular tachycardia: Secondary | ICD-10-CM

## 2017-09-08 DIAGNOSIS — J449 Chronic obstructive pulmonary disease, unspecified: Secondary | ICD-10-CM

## 2017-09-08 DIAGNOSIS — J9611 Chronic respiratory failure with hypoxia: Secondary | ICD-10-CM | POA: Diagnosis not present

## 2017-09-08 MED ORDER — IPRATROPIUM-ALBUTEROL 0.5-2.5 (3) MG/3ML IN SOLN: 3.0000 mL | Freq: Four times a day (QID) | RESPIRATORY_TRACT | 6 refills | Status: AC

## 2017-09-08 MED ORDER — ALBUTEROL SULFATE HFA 108 (90 BASE) MCG/ACT IN AERS: INHALATION_SPRAY | RESPIRATORY_TRACT | 5 refills | Status: DC

## 2017-09-08 MED ORDER — ALBUTEROL SULFATE (2.5 MG/3ML) 0.083% IN NEBU: 2.5000 mg | INHALATION_SOLUTION | RESPIRATORY_TRACT | 3 refills | Status: AC | PRN

## 2017-09-08 NOTE — Addendum Note (Signed)
Addended by: Madolyn Frieze on: 09/08/2017 04:30 PM   Modules accepted: Orders

## 2017-09-08 NOTE — Patient Instructions (Signed)
We will continue ipratropium/albuterol nebulized 4 times a day on a schedule. Please keep albuterol (ProAir) available to use 2 puffs up to every 4 hours if needed for shortness of breath, wheezing, chest tightness. Continue your oxygen at 3-4 L/min Do not restart Pulmicort (budesonide) nebulizers for now.  We will decide in the future whether we want to restart this medication depending on how you do off of it. Please talk with Dr. Harrington Challenger in cardiology about the metoprolol and rosuvastatin to see if she wants her to be on these medications long-term. Follow with Dr Lamonte Sakai in 6 months or sooner if you have any problems
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
## 2017-09-08 NOTE — Assessment & Plan Note (Signed)
Continue your oxygen at 3-4 L/min

## 2017-09-08 NOTE — Assessment & Plan Note (Signed)
Continue diltiazem.  She questions whether she needs to continue the metoprolol.  I have asked her to discuss this with Dr. Harrington Challenger.  Unclear to me that she needs restarted at this time.

## 2017-09-08 NOTE — Assessment & Plan Note (Signed)
We will continue ipratropium/albuterol nebulized 4 times a day on a schedule. Please keep albuterol (ProAir) available to use 2 puffs up to every 4 hours if needed for shortness of breath, wheezing, chest tightness. Do not restart Pulmicort (budesonide) nebulizers for now.  We will decide in the future whether we want to restart this medication depending on how you do off of it. I confirmed DNR status and placed orders on the chartr Follow with Dr Lamonte Sakai in 6 months or sooner if you have any problems

## 2017-09-17 ENCOUNTER — Telehealth: Payer: Self-pay | Admitting: Internal Medicine

## 2017-09-17 DIAGNOSIS — E785 Hyperlipidemia, unspecified: Secondary | ICD-10-CM

## 2017-09-17 NOTE — Telephone Encounter (Signed)
New Message   Pt c/o medication issue:  1. Name of Medication: rosuvastatin (CRESTOR) 20 MG tablet  2. How are you currently taking this medication (dosage and times per day)? 20mg  once a day  3. Are you having a reaction (difficulty breathing--STAT)?   4. What is your medication issue? Patient states that she is uncomfortable taking rosuvastatin or anything that ends in statin. She wants to know if she can get something else for cholesterol but definitely not Lipitor either. Please call to discuss,

## 2017-09-17 NOTE — Telephone Encounter (Signed)
Will route to Dr. Harrington Challenger. According to ov with Dr. Lamonte Sakai pt is not taking rosuvastatin, was on zetia in past. Also she questioned if she needed to be on metoprolol and reported is not taking either medication as of 09/08/17. Will route to Dr. Harrington Challenger to review and advise.

## 2017-09-18 NOTE — Telephone Encounter (Signed)
I would recomm taht the pt take Crestor 10 mg    F/U lipid panel in 3 months

## 2017-09-18 NOTE — Telephone Encounter (Signed)
Reviewed with patient. She was worried about a possible interaction with this and mucinex.  Advised I do not see a listed interaction between these medications.  She was reading "inactive ingredient list" on mucinex packaging.   Advised patient to take at different times of day.  She verbalizes understanding and will start Crestor. Rescheduled labs for in 3 months.  Pt aware of appointment date.

## 2017-09-22 ENCOUNTER — Other Ambulatory Visit: Payer: Medicare Other

## 2017-10-02 NOTE — Telephone Encounter (Signed)
Error

## 2017-10-06 ENCOUNTER — Other Ambulatory Visit: Payer: Self-pay | Admitting: *Deleted

## 2017-10-06 ENCOUNTER — Telehealth: Payer: Self-pay | Admitting: Internal Medicine

## 2017-10-06 MED ORDER — DILTIAZEM HCL 60 MG PO TABS: 60.0000 mg | ORAL_TABLET | Freq: Two times a day (BID) | ORAL | 2 refills | Status: DC

## 2017-10-06 NOTE — Telephone Encounter (Signed)
Do not need this encounter °

## 2017-10-06 NOTE — Telephone Encounter (Signed)
This medication was removed from patients current med list at 07/21/17 office visit with Dr Harrington Challenger with a reason of patient reported not taking but then put back on her list on 08/09/17. She is requesting a refill on it. Okay to refill? Please advise. Thanks, MI

## 2017-10-06 NOTE — Telephone Encounter (Signed)
New Message     *STAT* If patient is at the pharmacy, call can be transferred to refill team.   1. Which medications need to be refilled? (please list name of each medication and dose if known) diltiazem (CARDIZEM) 60 MG tablet  2. Which pharmacy/location (including street and city if local pharmacy) is medication to be sent to? [id number] - [place], [place] - [address]  3. Do they need a 30 day or 90 day supply? [place]

## 2017-10-06 NOTE — Telephone Encounter (Signed)
Ok to refill, thanks

## 2017-10-10 ENCOUNTER — Telehealth: Payer: Self-pay | Admitting: Emergency Medicine

## 2017-10-10 NOTE — Telephone Encounter (Signed)
Spoke with pt, aware of recs.  States she will call back if this regimen does not improve her dyspnea.  Will close encounter.

## 2017-10-10 NOTE — Telephone Encounter (Signed)
As of our last visit, I though she was on Duoneb qid on a schedule. I would recommend that she be back on this - reorder if we need to

## 2017-10-10 NOTE — Telephone Encounter (Signed)
Spoke with patient. She stated that she only has a ProAir inhaler and she has been using it a lot. So much so that she in the past few months, she is running out as much as 8-10 days before she is due for a refill. She has to use it with ADL, walking around and just to function without getting out of breath.   She has a history of COPD and is not using a maintenance inhaler. She wants to know if RB would be willing to prescribe one since the pharmacy suggested this.   [facility] [address]   RB, please advise. Thanks!

## 2017-12-22 ENCOUNTER — Other Ambulatory Visit: Payer: Medicare Other

## 2017-12-23 ENCOUNTER — Other Ambulatory Visit: Payer: Self-pay | Admitting: Emergency Medicine

## 2017-12-26 ENCOUNTER — Telehealth: Payer: Self-pay | Admitting: Emergency Medicine

## 2017-12-26 MED ORDER — AEROCHAMBER MV MISC: 0 refills | Status: AC

## 2017-12-26 NOTE — Telephone Encounter (Signed)
Received a fax from [facility]. Pt is requesting a prescription for an Aerochamber Spacer.  RB - please advise if you are okay with prescribing this. Thanks!

## 2017-12-26 NOTE — Telephone Encounter (Signed)
Called and spoke with patient she does not want to come to the office to pick up spacer. [facility]. Spacer sent in. Nothing further needed.

## 2017-12-26 NOTE — Telephone Encounter (Signed)
Yes ok to prescribe

## 2018-03-03 ENCOUNTER — Ambulatory Visit: Payer: Medicare Other | Admitting: Emergency Medicine

## 2018-03-11 ENCOUNTER — Ambulatory Visit: Payer: Medicare Other | Admitting: Emergency Medicine

## 2018-03-23 ENCOUNTER — Telehealth: Payer: Self-pay | Admitting: Emergency Medicine

## 2018-03-23 MED ORDER — ALBUTEROL SULFATE HFA 108 (90 BASE) MCG/ACT IN AERS: INHALATION_SPRAY | RESPIRATORY_TRACT | 5 refills | Status: AC

## 2018-03-23 NOTE — Telephone Encounter (Signed)
Called and spoke with pt letting her know Rx of proair was sent to her preferred pharmacy.  Pt expressed understanding. Nothing further needed.

## 2018-04-08 ENCOUNTER — Encounter: Payer: Self-pay | Admitting: Emergency Medicine

## 2018-04-08 ENCOUNTER — Ambulatory Visit: Payer: Medicare Other | Admitting: Emergency Medicine

## 2018-04-08 DIAGNOSIS — J449 Chronic obstructive pulmonary disease, unspecified: Secondary | ICD-10-CM

## 2018-04-08 DIAGNOSIS — Z23 Encounter for immunization: Secondary | ICD-10-CM

## 2018-04-08 DIAGNOSIS — J9611 Chronic respiratory failure with hypoxia: Secondary | ICD-10-CM

## 2018-04-08 NOTE — Assessment & Plan Note (Addendum)
Quite limited currently.  She notes that her mucus burden, thickness is contributing to her dyspnea.  She has not noted any desaturations at home on her current 3 to 4 L/min.  She is on a good bronchodilator regimen, scheduled nebs.  I'd like to try and more aggressively treat her secretions.  Change her fluticasone nasal spray to once daily from as needed, increased Mucinex to 600 mg twice daily.  If this is ineffective then we may need to consider starting low-dose daily prednisone at her next visit.  Flu shot today.

## 2018-04-08 NOTE — Patient Instructions (Signed)
Please continue your ipratropium/albuterol nebulizer treatments 4 times a day on a schedule. Use your albuterol inhaler 2 puffs up to every 4 hours if needed for shortness of breath, wheezing, chest tightness. Continue your oxygen at 3 to 4 L/min at all times. Start taking your Flonase nasal spray, 2 sprays each nostril every day (instead of just occasionally) Increase your Mucinex 12H to 600 mg twice a day every day. Flu shot today. Pneumonia shot up-to-date. Follow with Dr Lamonte Sakai in 2 months or sooner if you have any problems.

## 2018-04-08 NOTE — Assessment & Plan Note (Signed)
Continue oxygen at 3 to 4 L/min at all times
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
## 2018-04-10 ENCOUNTER — Telehealth: Payer: Self-pay | Admitting: Emergency Medicine

## 2018-04-10 NOTE — Telephone Encounter (Signed)
Called pt's pharmacy in regards to pt's proair Rx and spoke with Ander Purpura who stated pt had her Proair inhaler refilled October 3 and October 17.  Due to this, if pt is wanting to get a refill of her inhaler, insurance may not cover it if she had it filled before 11/28 due to already having two refills of the inhaler so close together in the month of October.  Called and spoke with pt stating the above info to her. Pt expressed understanding nothing further needed.

## 2018-06-02 ENCOUNTER — Telehealth: Payer: Self-pay | Admitting: Emergency Medicine

## 2018-06-02 MED ORDER — DOXYCYCLINE HYCLATE 100 MG PO TABS: 100.0000 mg | ORAL_TABLET | Freq: Two times a day (BID) | ORAL | 0 refills | Status: DC

## 2018-06-02 NOTE — Telephone Encounter (Signed)
Pt is aware of below recommendations and voiced her understanding. Nothing further is needed.  

## 2018-06-02 NOTE — Telephone Encounter (Signed)
RB pt. Last seen 04/08/18 with pending OV for 06/08/18. Called and spoke to pt.  Pt reports of nasal congestion, prod cough with clear mucus & sore throat x3d Sob is baseline per pt. Denied fever, chills or sweats.  Pt started taking mucinex cold and flu q4h yesterday with no improvement in sx. Pt stated she is using albuterol inhaler 5-6 times daily and albuterol neb 4 times daily. Pt stated that albuterol usage is this much daily even prior to being sick.  Pt declined appt.    Tonya please advise. Thanks

## 2018-06-02 NOTE — Telephone Encounter (Signed)
I went ahead and called her in doxycycline. Please call if symptoms do not improve.

## 2018-06-04 ENCOUNTER — Telehealth: Payer: Self-pay | Admitting: Emergency Medicine

## 2018-06-04 NOTE — Telephone Encounter (Signed)
Patient called and made aware that she was not being treated for the flu but a flare up she is taking the ABX nothing further needed at this time.

## 2018-06-04 NOTE — Telephone Encounter (Signed)
Left message to call back  

## 2018-06-04 NOTE — Telephone Encounter (Signed)
Patient returned call, CB is [phone]

## 2018-06-05 ENCOUNTER — Telehealth: Payer: Self-pay | Admitting: Emergency Medicine

## 2018-06-05 NOTE — Telephone Encounter (Signed)
I am sorry to hear the patient is not feeling well.  Patient will need office visit for further evaluation.  Patient will need flu swab in order to diagnose flu.  Unfortunately with her symptoms starting so far out she still would probably not be a candidate for Tamiflu.  So patient has only been on 3 days of doxycycline?Patient has office visit with Dr. Lamonte Sakai on 06/08/2018 which I recommend that she keep.  If patient is too weak to present for an office visit that she can present to an emergency room via EMS.  Wyn Quaker FNP

## 2018-06-05 NOTE — Telephone Encounter (Signed)
Called and spoke with pt. Pt states she believes she has the flu. Pt was prescribed abx last week based on her symptoms.  Pt said the abx said it as for a bacterial infection that she had been on but stated if this is in deed the flu, the abx of doxy that she was prescribed, she states she read that this will not treat the flu.  I asked pt if she has been tested to see if her symptoms are the flu but pt stated she has not but states she knows that she has the flu. Pt has had nasal congestion, productive cough with green mucus now, and also a sore throat. Pt was prescribed the doxy 06/02/18 after she first called in and was told to call back if no improvement.  I stated to pt that we would need her to come in for an appt so we could to the flu swab to see if she did have the flu but pt stated to me that she is too weak to be able to come in for an appt. Pt would like to know what we recommended to help with her symptoms.  Aaron Edelman, please advise on this for pt. Thanks!

## 2018-06-05 NOTE — Telephone Encounter (Signed)
Called pt and spoke with her daughter in regards to the information stated by Wyn Quaker. Pt is going to be taken to hospital by family for further evaluation due to becoming worse since being prescribed abx 06/02/18. Nothing further needed.

## 2018-06-08 ENCOUNTER — Ambulatory Visit: Payer: Medicare Other | Admitting: Emergency Medicine

## 2018-06-18 ENCOUNTER — Encounter (HOSPITAL_COMMUNITY): Payer: Self-pay

## 2018-06-18 ENCOUNTER — Emergency Department (HOSPITAL_COMMUNITY): Payer: Medicare Other

## 2018-06-18 ENCOUNTER — Inpatient Hospital Stay (HOSPITAL_COMMUNITY)
Admission: EM | Admit: 2018-06-18 | Discharge: 2018-07-02 | DRG: 190 | Disposition: A | Discharge status: Skilled Nursing Facility | Payer: Medicare Other | Attending: Family Medicine | Admitting: Family Medicine

## 2018-06-18 ENCOUNTER — Other Ambulatory Visit: Payer: Self-pay

## 2018-06-18 DIAGNOSIS — J441 Chronic obstructive pulmonary disease with (acute) exacerbation: Secondary | ICD-10-CM | POA: Diagnosis not present

## 2018-06-18 DIAGNOSIS — Z9981 Dependence on supplemental oxygen: Secondary | ICD-10-CM

## 2018-06-18 DIAGNOSIS — R5381 Other malaise: Secondary | ICD-10-CM | POA: Diagnosis present

## 2018-06-18 DIAGNOSIS — Z825 Family history of asthma and other chronic lower respiratory diseases: Secondary | ICD-10-CM

## 2018-06-18 DIAGNOSIS — F419 Anxiety disorder, unspecified: Secondary | ICD-10-CM | POA: Diagnosis present

## 2018-06-18 DIAGNOSIS — Z66 Do not resuscitate: Secondary | ICD-10-CM | POA: Diagnosis not present

## 2018-06-18 DIAGNOSIS — I493 Ventricular premature depolarization: Secondary | ICD-10-CM | POA: Diagnosis present

## 2018-06-18 DIAGNOSIS — Z79899 Other long term (current) drug therapy: Secondary | ICD-10-CM

## 2018-06-18 DIAGNOSIS — I10 Essential (primary) hypertension: Secondary | ICD-10-CM | POA: Diagnosis not present

## 2018-06-18 DIAGNOSIS — Z90710 Acquired absence of both cervix and uterus: Secondary | ICD-10-CM

## 2018-06-18 DIAGNOSIS — M8580 Other specified disorders of bone density and structure, unspecified site: Secondary | ICD-10-CM | POA: Diagnosis present

## 2018-06-18 DIAGNOSIS — Z823 Family history of stroke: Secondary | ICD-10-CM

## 2018-06-18 DIAGNOSIS — Z8249 Family history of ischemic heart disease and other diseases of the circulatory system: Secondary | ICD-10-CM

## 2018-06-18 DIAGNOSIS — I700 Atherosclerosis of aorta: Secondary | ICD-10-CM | POA: Diagnosis present

## 2018-06-18 DIAGNOSIS — E785 Hyperlipidemia, unspecified: Secondary | ICD-10-CM | POA: Diagnosis present

## 2018-06-18 DIAGNOSIS — J9621 Acute and chronic respiratory failure with hypoxia: Secondary | ICD-10-CM | POA: Diagnosis present

## 2018-06-18 DIAGNOSIS — Z87442 Personal history of urinary calculi: Secondary | ICD-10-CM

## 2018-06-18 DIAGNOSIS — I471 Supraventricular tachycardia, unspecified: Secondary | ICD-10-CM | POA: Diagnosis present

## 2018-06-18 DIAGNOSIS — E872 Acidosis: Secondary | ICD-10-CM | POA: Diagnosis not present

## 2018-06-18 DIAGNOSIS — K8020 Calculus of gallbladder without cholecystitis without obstruction: Secondary | ICD-10-CM | POA: Diagnosis present

## 2018-06-18 DIAGNOSIS — Z7982 Long term (current) use of aspirin: Secondary | ICD-10-CM

## 2018-06-18 DIAGNOSIS — I160 Hypertensive urgency: Secondary | ICD-10-CM | POA: Diagnosis not present

## 2018-06-18 DIAGNOSIS — J9610 Chronic respiratory failure, unspecified whether with hypoxia or hypercapnia: Secondary | ICD-10-CM | POA: Diagnosis present

## 2018-06-18 DIAGNOSIS — R05 Cough: Secondary | ICD-10-CM

## 2018-06-18 DIAGNOSIS — I2721 Secondary pulmonary arterial hypertension: Secondary | ICD-10-CM | POA: Diagnosis present

## 2018-06-18 DIAGNOSIS — I2510 Atherosclerotic heart disease of native coronary artery without angina pectoris: Secondary | ICD-10-CM | POA: Diagnosis present

## 2018-06-18 DIAGNOSIS — M1990 Unspecified osteoarthritis, unspecified site: Secondary | ICD-10-CM | POA: Diagnosis present

## 2018-06-18 DIAGNOSIS — Z6830 Body mass index (BMI) 30.0-30.9, adult: Secondary | ICD-10-CM

## 2018-06-18 DIAGNOSIS — Z87891 Personal history of nicotine dependence: Secondary | ICD-10-CM

## 2018-06-18 DIAGNOSIS — Z833 Family history of diabetes mellitus: Secondary | ICD-10-CM

## 2018-06-18 DIAGNOSIS — I5032 Chronic diastolic (congestive) heart failure: Secondary | ICD-10-CM | POA: Diagnosis present

## 2018-06-18 DIAGNOSIS — Z5320 Procedure and treatment not carried out because of patient's decision for unspecified reasons: Secondary | ICD-10-CM | POA: Diagnosis not present

## 2018-06-18 DIAGNOSIS — R059 Cough, unspecified: Secondary | ICD-10-CM

## 2018-06-18 DIAGNOSIS — Z09 Encounter for follow-up examination after completed treatment for conditions other than malignant neoplasm: Secondary | ICD-10-CM

## 2018-06-18 DIAGNOSIS — J449 Chronic obstructive pulmonary disease, unspecified: Secondary | ICD-10-CM | POA: Diagnosis present

## 2018-06-18 DIAGNOSIS — I110 Hypertensive heart disease with heart failure: Secondary | ICD-10-CM | POA: Diagnosis present

## 2018-06-18 DIAGNOSIS — Z841 Family history of disorders of kidney and ureter: Secondary | ICD-10-CM

## 2018-06-18 DIAGNOSIS — J9622 Acute and chronic respiratory failure with hypercapnia: Secondary | ICD-10-CM | POA: Diagnosis present

## 2018-06-18 DIAGNOSIS — K219 Gastro-esophageal reflux disease without esophagitis: Secondary | ICD-10-CM | POA: Diagnosis present

## 2018-06-18 DIAGNOSIS — J9611 Chronic respiratory failure with hypoxia: Secondary | ICD-10-CM | POA: Diagnosis not present

## 2018-06-18 LAB — CBC WITH DIFFERENTIAL/PLATELET
ABS IMMATURE GRANULOCYTES: 0.03 10*3/uL (ref 0.00–0.07)
BASOS ABS: 0 10*3/uL (ref 0.0–0.1)
BASOS PCT: 1 %
EOS PCT: 2 %
Eosinophils Absolute: 0.1 10*3/uL (ref 0.0–0.5)
HCT: 51.4 % — ABNORMAL HIGH (ref 36.0–46.0)
HEMOGLOBIN: 15 g/dL (ref 12.0–15.0)
Immature Granulocytes: 1 %
LYMPHS PCT: 34 %
Lymphs Abs: 1.3 10*3/uL (ref 0.7–4.0)
MCH: 31.2 pg (ref 26.0–34.0)
MCHC: 29.2 g/dL — AB (ref 30.0–36.0)
MCV: 106.9 fL — ABNORMAL HIGH (ref 80.0–100.0)
Monocytes Absolute: 0.4 10*3/uL (ref 0.1–1.0)
Monocytes Relative: 11 %
NEUTROS ABS: 2 10*3/uL (ref 1.7–7.7)
Neutrophils Relative %: 51 %
PLATELETS: 241 10*3/uL (ref 150–400)
RBC: 4.81 MIL/uL (ref 3.87–5.11)
RDW: 12.6 % (ref 11.5–15.5)
WBC: 3.9 10*3/uL — AB (ref 4.0–10.5)
nRBC: 0 % (ref 0.0–0.2)

## 2018-06-18 LAB — COMPREHENSIVE METABOLIC PANEL
ALBUMIN: 3.9 g/dL (ref 3.5–5.0)
ALK PHOS: 52 U/L (ref 38–126)
ALT: 52 U/L — ABNORMAL HIGH (ref 0–44)
ANION GAP: 5 (ref 5–15)
AST: 44 U/L — ABNORMAL HIGH (ref 15–41)
BUN: 9 mg/dL (ref 8–23)
CALCIUM: 8.6 mg/dL — AB (ref 8.9–10.3)
CO2: 36 mmol/L — AB (ref 22–32)
Chloride: 101 mmol/L (ref 98–111)
Creatinine, Ser: 0.69 mg/dL (ref 0.44–1.00)
GFR calc non Af Amer: >60 mL/min (ref >60)
GLUCOSE: 125 mg/dL — AB (ref 70–99)
Potassium: 4.1 mmol/L (ref 3.5–5.1)
Sodium: 142 mmol/L (ref 135–145)
Total Bilirubin: 1 mg/dL (ref 0.3–1.2)
Total Protein: 7.5 g/dL (ref 6.5–8.1)

## 2018-06-18 MED ORDER — SENNOSIDES-DOCUSATE SODIUM 8.6-50 MG PO TABS: 1.0000 | ORAL_TABLET | Freq: Every evening | ORAL | Status: DC | PRN

## 2018-06-18 MED ORDER — METOPROLOL TARTRATE 25 MG PO TABS
12.5000 mg | ORAL_TABLET | Freq: Two times a day (BID) | ORAL | Status: DC
  Administered 2018-06-18 – 2018-07-02 (×27): 12.5 mg via ORAL
  Filled 2018-06-18 (×27): qty 1

## 2018-06-18 MED ORDER — TRAZODONE HCL 50 MG PO TABS: 25.0000 mg | ORAL_TABLET | Freq: Every evening | ORAL | Status: DC | PRN

## 2018-06-18 MED ORDER — METHYLPREDNISOLONE SODIUM SUCC 125 MG IJ SOLR
60.0000 mg | Freq: Four times a day (QID) | INTRAMUSCULAR | Status: DC
  Administered 2018-06-18 – 2018-06-24 (×24): 60 mg via INTRAVENOUS
  Filled 2018-06-18 (×24): qty 2

## 2018-06-18 MED ORDER — METHYLPREDNISOLONE SODIUM SUCC 125 MG IJ SOLR
125.0000 mg | Freq: Once | INTRAMUSCULAR | Status: AC
  Administered 2018-06-18: 125 mg via INTRAVENOUS
  Filled 2018-06-18: qty 2

## 2018-06-18 MED ORDER — SODIUM CHLORIDE 0.9 % IV SOLN: INTRAVENOUS | Status: DC

## 2018-06-18 MED ORDER — ENOXAPARIN SODIUM 40 MG/0.4ML ~~LOC~~ SOLN
40.0000 mg | SUBCUTANEOUS | Status: DC
  Administered 2018-06-18 – 2018-07-01 (×14): 40 mg via SUBCUTANEOUS
  Filled 2018-06-18 (×14): qty 0.4

## 2018-06-18 MED ORDER — SODIUM CHLORIDE 0.9 % IV SOLN
INTRAVENOUS | Status: DC
  Administered 2018-06-18: 13:00:00 via INTRAVENOUS

## 2018-06-18 MED ORDER — IPRATROPIUM-ALBUTEROL 0.5-2.5 (3) MG/3ML IN SOLN
3.0000 mL | Freq: Once | RESPIRATORY_TRACT | Status: AC
  Administered 2018-06-18: 3 mL via RESPIRATORY_TRACT
  Filled 2018-06-18: qty 3

## 2018-06-18 MED ORDER — IPRATROPIUM BROMIDE 0.02 % IN SOLN: 0.5000 mg | Freq: Four times a day (QID) | RESPIRATORY_TRACT | Status: DC

## 2018-06-18 MED ORDER — ONDANSETRON HCL 4 MG PO TABS: 4.0000 mg | ORAL_TABLET | Freq: Four times a day (QID) | ORAL | Status: DC | PRN

## 2018-06-18 MED ORDER — ACETAMINOPHEN 650 MG RE SUPP: 650.0000 mg | Freq: Four times a day (QID) | RECTAL | Status: DC | PRN

## 2018-06-18 MED ORDER — ALBUTEROL SULFATE (2.5 MG/3ML) 0.083% IN NEBU: 2.5000 mg | INHALATION_SOLUTION | Freq: Four times a day (QID) | RESPIRATORY_TRACT | Status: DC

## 2018-06-18 MED ORDER — TRAMADOL HCL 50 MG PO TABS: 50.0000 mg | ORAL_TABLET | Freq: Four times a day (QID) | ORAL | Status: DC | PRN

## 2018-06-18 MED ORDER — IPRATROPIUM-ALBUTEROL 0.5-2.5 (3) MG/3ML IN SOLN
3.0000 mL | Freq: Four times a day (QID) | RESPIRATORY_TRACT | Status: DC
  Administered 2018-06-18: 3 mL via RESPIRATORY_TRACT
  Filled 2018-06-18: qty 3

## 2018-06-18 MED ORDER — FLUTICASONE PROPIONATE 50 MCG/ACT NA SUSP: 1.0000 | Freq: Every day | NASAL | Status: DC | PRN

## 2018-06-18 MED ORDER — NAPHAZOLINE-GLYCERIN 0.012-0.2 % OP SOLN: 1.0000 [drp] | Freq: Four times a day (QID) | OPHTHALMIC | Status: DC | PRN

## 2018-06-18 MED ORDER — DOXYCYCLINE HYCLATE 100 MG PO TABS
100.0000 mg | ORAL_TABLET | Freq: Two times a day (BID) | ORAL | Status: DC
  Administered 2018-06-18 – 2018-06-28 (×20): 100 mg via ORAL
  Filled 2018-06-18 (×20): qty 1

## 2018-06-18 MED ORDER — GUAIFENESIN ER 600 MG PO TB12
600.0000 mg | ORAL_TABLET | Freq: Two times a day (BID) | ORAL | Status: DC
  Administered 2018-06-18 – 2018-06-25 (×14): 600 mg via ORAL
  Filled 2018-06-18 (×14): qty 1

## 2018-06-18 MED ORDER — IPRATROPIUM-ALBUTEROL 0.5-2.5 (3) MG/3ML IN SOLN
3.0000 mL | Freq: Three times a day (TID) | RESPIRATORY_TRACT | Status: DC
  Administered 2018-06-19 – 2018-06-22 (×10): 3 mL via RESPIRATORY_TRACT
  Filled 2018-06-18 (×9): qty 3

## 2018-06-18 MED ORDER — ACETAMINOPHEN 325 MG PO TABS
650.0000 mg | ORAL_TABLET | Freq: Four times a day (QID) | ORAL | Status: DC | PRN
  Administered 2018-06-19 (×2): 650 mg via ORAL
  Filled 2018-06-18 (×2): qty 2

## 2018-06-18 MED ORDER — DILTIAZEM HCL 60 MG PO TABS
60.0000 mg | ORAL_TABLET | Freq: Two times a day (BID) | ORAL | Status: DC
  Administered 2018-06-18 – 2018-07-01 (×26): 60 mg via ORAL
  Filled 2018-06-18 (×28): qty 1

## 2018-06-18 MED ORDER — ALBUTEROL SULFATE (2.5 MG/3ML) 0.083% IN NEBU
2.5000 mg | INHALATION_SOLUTION | RESPIRATORY_TRACT | Status: DC | PRN
  Administered 2018-06-18 – 2018-06-26 (×5): 2.5 mg via RESPIRATORY_TRACT
  Filled 2018-06-18 (×6): qty 3

## 2018-06-18 MED ORDER — ONDANSETRON HCL 4 MG/2ML IJ SOLN
4.0000 mg | Freq: Four times a day (QID) | INTRAMUSCULAR | Status: DC | PRN
  Administered 2018-06-29 – 2018-07-01 (×2): 4 mg via INTRAVENOUS
  Filled 2018-06-18 (×2): qty 2

## 2018-06-18 MED ORDER — ASPIRIN EC 81 MG PO TBEC: 81.0000 mg | DELAYED_RELEASE_TABLET | Freq: Every day | ORAL | Status: DC | PRN

## 2018-06-18 MED ORDER — ADULT MULTIVITAMIN W/MINERALS CH
1.0000 | ORAL_TABLET | Freq: Every day | ORAL | Status: DC
  Administered 2018-06-19 – 2018-07-02 (×14): 1 via ORAL
  Filled 2018-06-18 (×15): qty 1
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
## 2018-06-18 NOTE — ED Triage Notes (Signed)
Pt BIB EMS from home. Pt reports respiratory symptoms x2 weeks. Finished antibiotics recently. Pt reports over last few days productive cough and started to get worse again. Pt denies pain and fever. Pt is SHOB at this time. Hx of COPD and on 3L O2 normally.   140/77 HR 66 Afib RR 26 98% 3L

## 2018-06-18 NOTE — ED Notes (Signed)
Pt attempted to ambulate to bathroom. Pt became very SHOB and SpO2 dropped to 88% on 3L O2.
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
## 2018-06-19 DIAGNOSIS — J9611 Chronic respiratory failure with hypoxia: Secondary | ICD-10-CM | POA: Diagnosis not present

## 2018-06-19 DIAGNOSIS — I5032 Chronic diastolic (congestive) heart failure: Secondary | ICD-10-CM | POA: Diagnosis not present

## 2018-06-19 DIAGNOSIS — J441 Chronic obstructive pulmonary disease with (acute) exacerbation: Secondary | ICD-10-CM | POA: Diagnosis not present

## 2018-06-19 LAB — CBC
HCT: 45.8 % (ref 36.0–46.0)
Hemoglobin: 13.8 g/dL (ref 12.0–15.0)
MCH: 32 pg (ref 26.0–34.0)
MCHC: 30.1 g/dL (ref 30.0–36.0)
MCV: 106.3 fL — ABNORMAL HIGH (ref 80.0–100.0)
Platelets: 204 10*3/uL (ref 150–400)
RBC: 4.31 MIL/uL (ref 3.87–5.11)
RDW: 12.5 % (ref 11.5–15.5)
WBC: 3.2 10*3/uL — ABNORMAL LOW (ref 4.0–10.5)
nRBC: 0 % (ref 0.0–0.2)

## 2018-06-19 LAB — BASIC METABOLIC PANEL
Anion gap: 6 (ref 5–15)
BUN: 8 mg/dL (ref 8–23)
CO2: 35 mmol/L — ABNORMAL HIGH (ref 22–32)
Calcium: 8.4 mg/dL — ABNORMAL LOW (ref 8.9–10.3)
Chloride: 102 mmol/L (ref 98–111)
Creatinine, Ser: 0.54 mg/dL (ref 0.44–1.00)
GFR calc Af Amer: >60 mL/min (ref >60)
GFR calc non Af Amer: >60 mL/min (ref >60)
Glucose, Bld: 164 mg/dL — ABNORMAL HIGH (ref 70–99)
Potassium: 4.5 mmol/L (ref 3.5–5.1)
Sodium: 143 mmol/L (ref 135–145)

## 2018-06-19 MED ORDER — GUAIFENESIN-DM 100-10 MG/5ML PO SYRP
10.0000 mL | ORAL_SOLUTION | ORAL | Status: DC | PRN
  Administered 2018-06-20: 10 mL via ORAL
  Filled 2018-06-19: qty 10

## 2018-06-19 NOTE — Care Management Note (Signed)
Case Management Note  Patient Details  Name: AZLEE MONFORTE MRN: [id number] Date of Birth: 16-Sep-1931  Subjective/Objective:   COPD. From home w/daughter. Has home 02-[facility]-patient states she will need home 02 travel tank-but hopes daughter will bring it from home to hospital @ dc. Patient staes she plans to d/c home @ d/c. [facility] chosen if [facility] ordered-recc HHRN/PT/OT/aide/sw,she declines going to a SNF.[place] rep Santiago Glad already following.                Action/Plan:d/c plan home w/HHC.   Expected Discharge Date:  (unknown)               Expected Discharge Plan:  [place]  In-House Referral:     Discharge planning Services  CM Consult  Post Acute Care Choice:  Durable Medical Equipment([facility] home 02,rw) Choice offered to:  Patient  DME Arranged:    DME Agency:     HH Arranged:    [place] Agency:     Status of Service:  In process, will continue to follow  If discussed at Long Length of Stay Meetings, dates discussed:    Additional Comments:  Dessa Phi, RN 06/19/2018, 3:40 PM

## 2018-06-19 NOTE — Progress Notes (Signed)
Nutrition Brief Note  Patient identified on the Malnutrition Screening Tool (MST) Report  Wt Readings from Last 15 Encounters:  04/08/18 83.5 kg  09/08/17 83.9 kg  08/14/17 83.2 kg  07/21/17 84.7 kg  03/26/17 83.9 kg  08/12/16 79.8 kg  07/01/16 86.8 kg  02/19/16 85 kg  08/14/15 82.6 kg  05/23/15 82.6 kg  05/16/15 83 kg  05/11/15 83.4 kg  02/06/15 83.9 kg  11/15/14 83.9 kg  07/18/14 84.7 kg   Patients calculated BMI is 30.6 There is no height or weight on file to calculate BMI.   Patient meets criteria for obese based on current BMI.   Current diet order is regular, patient is consuming approximately 80% of meals prior to admission. Patient had received tray as RD entered the room. Labs and medications reviewed.   No nutrition interventions warranted at this time. If nutrition issues arise, please consult RD.   Lajuan Lines, RD, LDN  After Hours/Weekend Pager: [phone]

## 2018-06-19 NOTE — Progress Notes (Signed)
PCP-Dr. Rama until established w/pcp-[facility] provider list given to patient.

## 2018-06-19 NOTE — Progress Notes (Signed)
  PROGRESS NOTE  AKITA MAXIM MKL:[id number] DOB: 04-06-1932 DOA: 06/18/2018 PCP: Patient, No Pcp Per  Brief History   85 year old woman PMH COPD on 3 L nasal cannula at baseline, chronic diastolic CHF presented with shortness of breath worsening over the last several days.  Admitted for COPD exacerbation.  A & P  COPD exacerbation --No significant improvement yet, still short of breath, pursed lip breathing, talking in short sentences. --No opportunity to wean steroids yet.  Continue bronchodilators, supplemental oxygen and doxycycline given productive cough  Chronic hypoxic respiratory failure on 3 L nasal cannula at home --Oxygen requirement stable.  Chronic diastolic CHF --Appears compensated  DVT prophylaxis: enoxaparin Code Status: Full Family Communication: none Disposition Plan: home with sister    Murray Hodgkins, MD  [facility] Direct contact: see [email or web address]  7PM-7AM contact night coverage as above 06/19/2018, 2:34 PM  LOS: 0 days   Consultants  .   Procedures  .   Antibiotics  . Doxycycline 1/9 >  Interval History/Subjective  Feels "sick".  Short of breath.  Worse than usual.  Not much improvement.  No nausea or vomiting.  Very poor appetite.  No other complaints.  Objective   Vitals:  Vitals:   06/19/18 0844 06/19/18 1244  BP:  138/77  Pulse:  72  Resp:  16  Temp:  97.8 F (36.6 C)  SpO2: 100% 90%    Exam:  Constitutional:  . Appears calm, uncomfortable, ill but not toxic. Eyes:  . pupils and irises appear normal ENMT:  . grossly normal hearing  Respiratory:  . Poor air movement, tachypneic, no frank wheezes, rales or rhonchi. Marland Kitchen Respiratory effort mild to moderate increase, speaks in short sentences, pursed lip breathing. Cardiovascular:  . RRR, no m/r/g . No LE extremity edema   Abdomen:  . Soft, nontender Musculoskeletal:  . RUE, LUE, RLE, LLE   . Moves all extremities Psychiatric:  . Mental status o Mood, affect  appropriate  I have personally reviewed the following:   Today's Data  . Basic metabolic panel unremarkable . WBC 3.2, remainder CBC unremarkable  Lab Data  .   Micro Data  .   Imaging  . Chest x-ray independently reviewed, no acute disease  Cardiology Data  . EKG sinus tachycardia, prolonged QT  Other Data  .   Scheduled Meds: . diltiazem  60 mg Oral BID  . doxycycline  100 mg Oral Q12H  . enoxaparin (LOVENOX) injection  40 mg Subcutaneous Q24H  . guaiFENesin  600 mg Oral BID  . ipratropium-albuterol  3 mL Nebulization TID  . methylPREDNISolone (SOLU-MEDROL) injection  60 mg Intravenous Q6H  . metoprolol tartrate  12.5 mg Oral BID  . multivitamin with minerals  1 tablet Oral Daily   Continuous Infusions:  Principal Problem:   COPD exacerbation (HCC) Active Problems:   Dyslipidemia   Essential hypertension   SVT (supraventricular tachycardia) (HCC)   GERD (gastroesophageal reflux disease)   Chronic respiratory failure (HCC)   Chronic diastolic heart failure ([place])   LOS: 0 days

## 2018-06-19 NOTE — Care Management Obs Status (Signed)
[place] NOTIFICATION   Patient Details  Name: LEILA SCHUFF MRN: [id number] Date of Birth: 21-Dec-1931   Medicare Observation Status Notification Given:  Yes    MahabirJuliann Pulse, RN 06/19/2018, 4:23 PM

## 2018-06-20 DIAGNOSIS — I2721 Secondary pulmonary arterial hypertension: Secondary | ICD-10-CM | POA: Diagnosis present

## 2018-06-20 DIAGNOSIS — J9612 Chronic respiratory failure with hypercapnia: Secondary | ICD-10-CM | POA: Diagnosis not present

## 2018-06-20 DIAGNOSIS — Z8249 Family history of ischemic heart disease and other diseases of the circulatory system: Secondary | ICD-10-CM | POA: Diagnosis not present

## 2018-06-20 DIAGNOSIS — Z825 Family history of asthma and other chronic lower respiratory diseases: Secondary | ICD-10-CM | POA: Diagnosis not present

## 2018-06-20 DIAGNOSIS — J431 Panlobular emphysema: Secondary | ICD-10-CM | POA: Diagnosis not present

## 2018-06-20 DIAGNOSIS — Z87442 Personal history of urinary calculi: Secondary | ICD-10-CM | POA: Diagnosis not present

## 2018-06-20 DIAGNOSIS — Z833 Family history of diabetes mellitus: Secondary | ICD-10-CM | POA: Diagnosis not present

## 2018-06-20 DIAGNOSIS — Z87891 Personal history of nicotine dependence: Secondary | ICD-10-CM | POA: Diagnosis not present

## 2018-06-20 DIAGNOSIS — J9621 Acute and chronic respiratory failure with hypoxia: Secondary | ICD-10-CM | POA: Diagnosis present

## 2018-06-20 DIAGNOSIS — J9622 Acute and chronic respiratory failure with hypercapnia: Secondary | ICD-10-CM | POA: Diagnosis present

## 2018-06-20 DIAGNOSIS — I700 Atherosclerosis of aorta: Secondary | ICD-10-CM | POA: Diagnosis present

## 2018-06-20 DIAGNOSIS — M1990 Unspecified osteoarthritis, unspecified site: Secondary | ICD-10-CM | POA: Diagnosis present

## 2018-06-20 DIAGNOSIS — I471 Supraventricular tachycardia: Secondary | ICD-10-CM | POA: Diagnosis present

## 2018-06-20 DIAGNOSIS — E785 Hyperlipidemia, unspecified: Secondary | ICD-10-CM | POA: Diagnosis not present

## 2018-06-20 DIAGNOSIS — I110 Hypertensive heart disease with heart failure: Secondary | ICD-10-CM | POA: Diagnosis present

## 2018-06-20 DIAGNOSIS — M8580 Other specified disorders of bone density and structure, unspecified site: Secondary | ICD-10-CM | POA: Diagnosis present

## 2018-06-20 DIAGNOSIS — J9611 Chronic respiratory failure with hypoxia: Secondary | ICD-10-CM | POA: Diagnosis not present

## 2018-06-20 DIAGNOSIS — I10 Essential (primary) hypertension: Secondary | ICD-10-CM | POA: Diagnosis not present

## 2018-06-20 DIAGNOSIS — I5032 Chronic diastolic (congestive) heart failure: Secondary | ICD-10-CM | POA: Diagnosis present

## 2018-06-20 DIAGNOSIS — Z823 Family history of stroke: Secondary | ICD-10-CM | POA: Diagnosis not present

## 2018-06-20 DIAGNOSIS — K219 Gastro-esophageal reflux disease without esophagitis: Secondary | ICD-10-CM | POA: Diagnosis not present

## 2018-06-20 DIAGNOSIS — E872 Acidosis: Secondary | ICD-10-CM | POA: Diagnosis not present

## 2018-06-20 DIAGNOSIS — J441 Chronic obstructive pulmonary disease with (acute) exacerbation: Secondary | ICD-10-CM | POA: Diagnosis present

## 2018-06-20 DIAGNOSIS — I160 Hypertensive urgency: Secondary | ICD-10-CM | POA: Diagnosis not present

## 2018-06-20 DIAGNOSIS — Z841 Family history of disorders of kidney and ureter: Secondary | ICD-10-CM | POA: Diagnosis not present

## 2018-06-20 DIAGNOSIS — I493 Ventricular premature depolarization: Secondary | ICD-10-CM | POA: Diagnosis present

## 2018-06-20 DIAGNOSIS — K8020 Calculus of gallbladder without cholecystitis without obstruction: Secondary | ICD-10-CM | POA: Diagnosis present

## 2018-06-20 DIAGNOSIS — Z9981 Dependence on supplemental oxygen: Secondary | ICD-10-CM | POA: Diagnosis not present

## 2018-06-20 MED ORDER — LORAZEPAM 2 MG/ML IJ SOLN
0.5000 mg | Freq: Once | INTRAMUSCULAR | Status: AC
  Administered 2018-06-20: 0.5 mg via INTRAVENOUS
  Filled 2018-06-20: qty 1

## 2018-06-20 MED ORDER — ENSURE ENLIVE PO LIQD
237.0000 mL | Freq: Two times a day (BID) | ORAL | Status: DC
  Administered 2018-06-20 – 2018-07-02 (×18): 237 mL via ORAL

## 2018-06-20 NOTE — Progress Notes (Signed)
Called to the bedside by RN with concerns for respiratory distress, anxiety and hypertensive crisis (BP 206/134). On arrival, pt is SOB but able to hold a conversation. VSS.and improvement is noted in anxiety level. She states that her breathing has improved on 5L [place]. A discussion was had about pt code status at time of assessment. At this time she would like to be a partial code with no intubation, CPR, or BiPAP. Will give pt anxiolytic and increase oxygen on nasal cannula. No further intervention will be needed at this time.  Anxiety - Ativan 0.5mg  IV given at bedside  COPD exacerbation - Continue pt on 5L [place].  Code Status -Changed to partial code at the request of patient.   Lovey Newcomer, NP [facility] 7p-7a [phone]

## 2018-06-20 NOTE — Progress Notes (Signed)
Called to room stat for patient who is having distress after movement for ADL activity.  Saturations upon entering room are in the low 80s, HR 110 and RR in the 40s.  HHN tx given and patients saturations are slowly climbing.  Placed back on [place] 4 lpm and requested the NP be called for anxiety medications.  NP came to room and eval patient and ordered medications.  Saturations 92% and HR 82 when this writer left patient who states that she is much improved.

## 2018-06-20 NOTE — Progress Notes (Addendum)
PROGRESS NOTE  [name]:[id number] DOB: 1931-07-27 DOA: 06/18/2018 PCP: Patient, No Pcp Per  HPI/Recap of past 24 hours: Patient admitted with COPD exacerbation.  [name] is an 85 year old African-American female with past medical history significant for chronic COPD O2 dependent on 3 L/min nasal cannula at home, diastolic congestive heart failure hypertension hyperlipidemia GERD.  She was admitted due to dyspnea despite her nebulizer and oxygen at home.  Subjective: Patient seen and examined at bedside she stated she is still not feeling quite well.  She stated that she does not have a good appetite, she is requesting for Ensure as she has not been eating very well.  Assessment/Plan: Principal Problem:   COPD exacerbation (HCC) Active Problems:   Dyslipidemia   Essential hypertension   COPD (chronic obstructive pulmonary disease) (HCC)   SVT (supraventricular tachycardia) (HCC)   GERD (gastroesophageal reflux disease)   Chronic respiratory failure (HCC)   Chronic diastolic heart failure ([place])   #1 COPD exacerbation patient is continuous pulse ox.  Continue doxycycline, IV Solu-Medrol, nebulizer treatments.  We will change her to inpatient.  Patient encouraged to use her incentive spirometry  2.  Hypertension continue diltiazem  3.  Congestive heart failure stable  GERD continue PPI  Code Status: Full  Severity of Illness: The appropriate patient status for this patient is INPATIENT. Inpatient status is judged to be reasonable and necessary in order to provide the required intensity of service to ensure the patient's safety. The patient's presenting symptoms, physical exam findings, and initial radiographic and laboratory data in the context of their chronic comorbidities is felt to place them at high risk for further clinical deterioration. Furthermore, it is not anticipated that the patient will be medically stable for discharge from the hospital within 2  midnights of admission. The following factors support the patient status of inpatient.   " The patient's presenting symptoms include shortness of breath requiring oxygen. " The worrisome physical exam findings include shortness of breath requiring oxygen. " The initial radiographic and laboratory data are worrisome because of COPD. " The chronic co-morbidities include just of heart failure.   * I certify that at the point of admission it is my clinical judgment that the patient will require inpatient hospital care spanning beyond 2 midnights from the point of admission due to high intensity of service, high risk for further deterioration and high frequency of surveillance required.*    Family Communication: None at bedside  Disposition Plan: Home when stable   Consultants:  None  Procedures:  None  Antimicrobials:  Doxycycline  DVT prophylaxis: Lovenox   Objective: Vitals:   06/20/18 0605 06/20/18 0949 06/20/18 1423 06/20/18 1425  BP: (!) 142/70  137/76   Pulse: 72  65   Resp: 18  17   Temp: 98.2 F (36.8 C)  (!) 97.3 F (36.3 C)   TempSrc: Oral  Oral   SpO2: 94% 95% 94% 94%    Intake/Output Summary (Last 24 hours) at 06/20/2018 1752 Last data filed at 06/20/2018 1715 Gross per 24 hour  Intake 360 ml  Output 1550 ml  Net -1190 ml   There were no vitals filed for this visit. There is no height or weight on file to calculate BMI.  Exam:  . General: 85 y.o. year-old female well developed well nourished in no acute distress.  Alert and oriented x3. . Cardiovascular: Regular rate and rhythm with no rubs or gallops.  No thyromegaly or JVD noted.   Marland Kitchen  Respiratory: Clear to auscultation with no wheezes or rales.  Increased effort and work of breathing  . Abdomen: Soft nontender nondistended with normal bowel sounds x4 quadrants. . Musculoskeletal: No lower extremity edema. 2/4 pulses in all 4 extremities. . Skin: No ulcerative lesions noted or rashes, . Psychiatry:  Mood is appropriate for condition and setting    Data Reviewed: CBC: Recent Labs  Lab 06/18/18 1311 06/19/18 0501  WBC 3.9* 3.2*  NEUTROABS 2.0  --   HGB 15.0 13.8  HCT 51.4* 45.8  MCV 106.9* 106.3*  PLT 241 409   Basic Metabolic Panel: Recent Labs  Lab 06/18/18 1311 06/19/18 0733  NA 142 143  K 4.1 4.5  CL 101 102  CO2 36* 35*  GLUCOSE 125* 164*  BUN 9 8  CREATININE 0.69 0.54  CALCIUM 8.6* 8.4*   GFR: CrCl cannot be calculated (Unknown ideal weight.). Liver Function Tests: Recent Labs  Lab 06/18/18 1311  AST 44*  ALT 52*  ALKPHOS 52  BILITOT 1.0  PROT 7.5  ALBUMIN 3.9   No results for input(s): LIPASE, AMYLASE in the last 168 hours. No results for input(s): AMMONIA in the last 168 hours. Coagulation Profile: No results for input(s): INR, PROTIME in the last 168 hours. Cardiac Enzymes: No results for input(s): CKTOTAL, CKMB, CKMBINDEX, TROPONINI in the last 168 hours. BNP (last 3 results) No results for input(s): PROBNP in the last 8760 hours. HbA1C: No results for input(s): HGBA1C in the last 72 hours. CBG: No results for input(s): GLUCAP in the last 168 hours. Lipid Profile: No results for input(s): CHOL, HDL, LDLCALC, TRIG, CHOLHDL, LDLDIRECT in the last 72 hours. Thyroid Function Tests: No results for input(s): TSH, T4TOTAL, FREET4, T3FREE, THYROIDAB in the last 72 hours. Anemia Panel: No results for input(s): VITAMINB12, FOLATE, FERRITIN, TIBC, IRON, RETICCTPCT in the last 72 hours. Urine analysis:    Component Value Date/Time   COLORURINE YELLOW 11/14/2014 2021   APPEARANCEUR CLEAR 11/14/2014 2021   LABSPEC 1.004 (L) 11/14/2014 2021   PHURINE 5.5 11/14/2014 2021   GLUCOSEU NEGATIVE 11/14/2014 2021   HGBUR NEGATIVE 11/14/2014 2021   [place] NEGATIVE 11/14/2014 2021   KETONESUR NEGATIVE 11/14/2014 2021   PROTEINUR NEGATIVE 11/14/2014 2021   UROBILINOGEN 1.0 11/14/2014 2021   NITRITE NEGATIVE 11/14/2014 2021   LEUKOCYTESUR NEGATIVE  11/14/2014 2021   Sepsis Labs: @LABRCNTIP (procalcitonin:4,lacticidven:4)  )No results found for this or any previous visit (from the past 240 hour(s)).    Studies: No results found.  Scheduled Meds: . diltiazem  60 mg Oral BID  . doxycycline  100 mg Oral Q12H  . enoxaparin (LOVENOX) injection  40 mg Subcutaneous Q24H  . feeding supplement (ENSURE ENLIVE)  237 mL Oral BID BM  . guaiFENesin  600 mg Oral BID  . ipratropium-albuterol  3 mL Nebulization TID  . methylPREDNISolone (SOLU-MEDROL) injection  60 mg Intravenous Q6H  . metoprolol tartrate  12.5 mg Oral BID  . multivitamin with minerals  1 tablet Oral Daily     LOS: 0 days     Cristal Deer, MD [facility]  To reach me or the doctor on call, go to: [email or web address] Password [facility]  06/20/2018, 5:52 PM

## 2018-06-20 NOTE — Progress Notes (Signed)
Called to pt room by nurse techs who stated pt is in respiratory distress after attempting bed change. O2 sat 74% on 3L. Found her with nurse techs sitting on the side of the bed with labored breathing. Respiratory was called. O2 was increased to 5L and VS obtained. Respiratory present to admin breathing tx. Nurse paged NP on call regarding distress, anxiousness, BP, pulse, and O2. NP stated she would be at bedside shortly. In the meantime, pt sats began to rise with breathing tx, BP and pulse began to decrease slowly but steadily.   1950: NP at bedside, see new orders placed.  BP 126/73

## 2018-06-21 LAB — COMPREHENSIVE METABOLIC PANEL
ALT: 82 U/L — ABNORMAL HIGH (ref 0–44)
ANION GAP: 9 (ref 5–15)
AST: 38 U/L (ref 15–41)
Albumin: 3.5 g/dL (ref 3.5–5.0)
Alkaline Phosphatase: 48 U/L (ref 38–126)
BUN: 21 mg/dL (ref 8–23)
CO2: 39 mmol/L — ABNORMAL HIGH (ref 22–32)
Calcium: 8.8 mg/dL — ABNORMAL LOW (ref 8.9–10.3)
Chloride: 95 mmol/L — ABNORMAL LOW (ref 98–111)
Creatinine, Ser: 0.66 mg/dL (ref 0.44–1.00)
GFR calc Af Amer: >60 mL/min (ref >60)
GFR calc non Af Amer: >60 mL/min (ref >60)
Glucose, Bld: 137 mg/dL — ABNORMAL HIGH (ref 70–99)
Potassium: 4.4 mmol/L (ref 3.5–5.1)
Sodium: 143 mmol/L (ref 135–145)
Total Bilirubin: 0.6 mg/dL (ref 0.3–1.2)
Total Protein: 6.9 g/dL (ref 6.5–8.1)

## 2018-06-21 MED ORDER — LORAZEPAM 2 MG/ML IJ SOLN
0.5000 mg | Freq: Once | INTRAMUSCULAR | Status: AC
  Administered 2018-06-21: 0.5 mg via INTRAVENOUS
  Filled 2018-06-21: qty 1

## 2018-06-21 NOTE — Progress Notes (Signed)
PROGRESS NOTE  EMILENE ROMA QQI:[id number] DOB: 1931/06/13 DOA: 06/18/2018 PCP: Patient, No Pcp Per  HPI/Recap of past 24 hours: Patient admitted with COPD exacerbation.  Kiesha Ensey is an 85 year old African-American female with past medical history significant for chronic COPD O2 dependent on 3 L/min nasal cannula at home, diastolic congestive heart failure hypertension hyperlipidemia GERD.  She was admitted due to dyspnea despite her nebulizer and oxygen at home.  06/21/2018: Overnight, patient went into acute respiratory distress and had hypertensive crisis with blood pressure going up to the 206/134.  Her oxygen was increased to 5 L/min by nasal cannula and her CODE STATUS was changed to partial code.  Subjective: Patient seen and examined at bedside she stated she is still not feeling quite well.  She stated that she does not have a good appetite, she is requesting for Ensure as she has not been eating very well.  SUBJECTIVE:06/21/2018. Patient seen and examined at bedside still having some shortness of breath  Assessment/Plan: Principal Problem:   COPD exacerbation ([place]) Active Problems:   Dyslipidemia   Essential hypertension   COPD (chronic obstructive pulmonary disease) (HCC)   SVT (supraventricular tachycardia) (HCC)   GERD (gastroesophageal reflux disease)   Chronic respiratory failure (HCC)   Chronic diastolic heart failure ([place])   #1 COPD exacerbation patient is continuous pulse ox.  Continue doxycycline, IV Solu-Medrol, nebulizer treatments.  We will change her to inpatient.  Patient encouraged to use her incentive spirometry  2.  Hypertension.  Patient had a hypertensive urgency last night.  We will continue diltiazem  3.  Congestive heart failure stable  4.GERD continue PPI  5.  Acute respiratory distress overnight with increased requirement of oxygen.  Patient is much better this morning we will continue to monitor overnight possible discharge in the  morning  Code Status: Partial code  Severity of Illness: The appropriate patient status for this patient is INPATIENT. Inpatient status is judged to be reasonable and necessary in order to provide the required intensity of service to ensure the patient's safety. The patient's presenting symptoms, physical exam findings, and initial radiographic and laboratory data in the context of their chronic comorbidities is felt to place them at high risk for further clinical deterioration. Furthermore, it is not anticipated that the patient will be medically stable for discharge from the hospital within 2 midnights of admission. The following factors support the patient status of inpatient.   " The patient's presenting symptoms include shortness of breath requiring oxygen. " The worrisome physical exam findings include shortness of breath requiring increasing her oxygen to 5 L oxygen.  Patient went into acute respiratory distress overnight " The initial radiographic and laboratory data are worrisome because of COPD. " The chronic co-morbidities include just of heart failure.   * I certify that at the point of admission it is my clinical judgment that the patient will require inpatient hospital care spanning beyond 2 midnights from the point of admission due to high intensity of service, high risk for further deterioration and high frequency of surveillance required.*    Family Communication: None at bedside  Disposition Plan: Home when stable by in 1 to 2 days   Consultants:  None  Procedures:  None  Antimicrobials:  Doxycycline  DVT prophylaxis: Lovenox   Objective: Vitals:   06/20/18 1937 06/20/18 1950 06/20/18 2121 06/21/18 0554  BP: (!) 185/106 126/73 (!) 152/82 (!) 155/90  Pulse: (!) 102 66 88 78  Resp: (!) 30  20 20  Temp: 97.7 F (36.5 C)  98.5 F (36.9 C) 98.5 F (36.9 C)  TempSrc: Oral  Oral Oral  SpO2: 90%  99% 98%    Intake/Output Summary (Last 24 hours) at  06/21/2018 0826 Last data filed at 06/21/2018 0556 Gross per 24 hour  Intake 650 ml  Output 1300 ml  Net -650 ml   There were no vitals filed for this visit. There is no height or weight on file to calculate BMI.  Exam:  . General: 85 y.o. year-old female well developed well nourished in no acute distress.  Alert and oriented x3. . Cardiovascular: Regular rate and rhythm with no rubs or gallops.  No thyromegaly or JVD noted.   Marland Kitchen Respiratory: Clear to auscultation with no wheezes or rales.  Increased effort and work of breathing.  Patient is puffing her lips . Abdomen: Soft nontender nondistended with normal bowel sounds x4 quadrants. . Musculoskeletal: No lower extremity edema. 2/4 pulses in all 4 extremities. . Skin: No ulcerative lesions noted or rashes, . Psychiatry: Mood is appropriate for condition and setting    Data Reviewed: CBC: Recent Labs  Lab 06/18/18 1311 06/19/18 0501  WBC 3.9* 3.2*  NEUTROABS 2.0  --   HGB 15.0 13.8  HCT 51.4* 45.8  MCV 106.9* 106.3*  PLT 241 409   Basic Metabolic Panel: Recent Labs  Lab 06/18/18 1311 06/19/18 0733 06/21/18 0410  NA 142 143 143  K 4.1 4.5 4.4  CL 101 102 95*  CO2 36* 35* 39*  GLUCOSE 125* 164* 137*  BUN 9 8 21   CREATININE 0.69 0.54 0.66  CALCIUM 8.6* 8.4* 8.8*   GFR: CrCl cannot be calculated (Unknown ideal weight.). Liver Function Tests: Recent Labs  Lab 06/18/18 1311 06/21/18 0410  AST 44* 38  ALT 52* 82*  ALKPHOS 52 48  BILITOT 1.0 0.6  PROT 7.5 6.9  ALBUMIN 3.9 3.5   No results for input(s): LIPASE, AMYLASE in the last 168 hours. No results for input(s): AMMONIA in the last 168 hours. Coagulation Profile: No results for input(s): INR, PROTIME in the last 168 hours. Cardiac Enzymes: No results for input(s): CKTOTAL, CKMB, CKMBINDEX, TROPONINI in the last 168 hours. BNP (last 3 results) No results for input(s): PROBNP in the last 8760 hours. HbA1C: No results for input(s): HGBA1C in the last 72  hours. CBG: No results for input(s): GLUCAP in the last 168 hours. Lipid Profile: No results for input(s): CHOL, HDL, LDLCALC, TRIG, CHOLHDL, LDLDIRECT in the last 72 hours. Thyroid Function Tests: No results for input(s): TSH, T4TOTAL, FREET4, T3FREE, THYROIDAB in the last 72 hours. Anemia Panel: No results for input(s): VITAMINB12, FOLATE, FERRITIN, TIBC, IRON, RETICCTPCT in the last 72 hours. Urine analysis:    Component Value Date/Time   COLORURINE YELLOW 11/14/2014 2021   APPEARANCEUR CLEAR 11/14/2014 2021   LABSPEC 1.004 (L) 11/14/2014 2021   PHURINE 5.5 11/14/2014 2021   GLUCOSEU NEGATIVE 11/14/2014 2021   HGBUR NEGATIVE 11/14/2014 2021   [place] NEGATIVE 11/14/2014 2021   KETONESUR NEGATIVE 11/14/2014 2021   PROTEINUR NEGATIVE 11/14/2014 2021   UROBILINOGEN 1.0 11/14/2014 2021   NITRITE NEGATIVE 11/14/2014 2021   LEUKOCYTESUR NEGATIVE 11/14/2014 2021   Sepsis Labs: @LABRCNTIP (procalcitonin:4,lacticidven:4)  )No results found for this or any previous visit (from the past 240 hour(s)).    Studies: No results found.  Scheduled Meds: . diltiazem  60 mg Oral BID  . doxycycline  100 mg Oral Q12H  . enoxaparin (LOVENOX) injection  40 mg Subcutaneous Q24H  .  feeding supplement (ENSURE ENLIVE)  237 mL Oral BID BM  . guaiFENesin  600 mg Oral BID  . ipratropium-albuterol  3 mL Nebulization TID  . methylPREDNISolone (SOLU-MEDROL) injection  60 mg Intravenous Q6H  . metoprolol tartrate  12.5 mg Oral BID  . multivitamin with minerals  1 tablet Oral Daily     LOS: 1 day     Cristal Deer, MD [facility]  To reach me or the doctor on call, go to: [email or web address] Password [facility]  06/21/2018, 8:26 AM

## 2018-06-21 NOTE — Progress Notes (Signed)
Called stat to room, pt. Had gotten up on side of bed and desaturated and removed her oxygen tubing.  Neb tx given and o2 replaced with saturations coming up to 92%.  Requested call for anxiety medication.

## 2018-06-22 MED ORDER — IPRATROPIUM-ALBUTEROL 0.5-2.5 (3) MG/3ML IN SOLN
3.0000 mL | Freq: Four times a day (QID) | RESPIRATORY_TRACT | Status: DC
  Administered 2018-06-22 – 2018-06-25 (×13): 3 mL via RESPIRATORY_TRACT
  Filled 2018-06-22 (×13): qty 3

## 2018-06-22 MED ORDER — BUDESONIDE 0.25 MG/2ML IN SUSP
0.2500 mg | Freq: Two times a day (BID) | RESPIRATORY_TRACT | Status: DC
  Administered 2018-06-22 – 2018-06-26 (×8): 0.25 mg via RESPIRATORY_TRACT
  Filled 2018-06-22 (×8): qty 2

## 2018-06-22 NOTE — Progress Notes (Signed)
Pt bed alarm began to alert, found patient sitting on edge of the bed with oxygen out, O2 sats around 74%, pt disoriented pulling out nasal canula as I was holding it in her nose, pt very anxious. Respiratory called to room for breathing treatment. NP was paged for something for anxiety. Breathing tx administered, O2 sats slowly began to climb, BP 167/102 pulse 120. Pt was helped back into bed.

## 2018-06-22 NOTE — Progress Notes (Signed)
PROGRESS NOTE    [name]  XNA:[id number] DOB: 02-Mar-1932 DOA: 06/18/2018 PCP: Patient, No Pcp Per  Outpatient Specialists:   Brief Narrative:  Patient admitted with COPD exacerbation.  [name] is an 85 year old African-American female with past medical history significant for chronic COPD O2 dependent on 3 L/min nasal cannula at home, diastolic congestive heart failure hypertension hyperlipidemia GERD.  She was admitted due to dyspnea despite her nebulizer and oxygen at home.  As per prior documentation - "06/21/2018: Overnight, patient went into acute respiratory distress and had hypertensive crisis with blood pressure going up to the 206/134.  Her oxygen was increased to 5 L/min by nasal cannula and her CODE STATUS was changed to partial code".   Assessment & Plan:   Principal Problem:   COPD exacerbation ([place]) Active Problems:   Dyslipidemia   Essential hypertension   COPD (chronic obstructive pulmonary disease) (HCC)   SVT (supraventricular tachycardia) (HCC)   GERD (gastroesophageal reflux disease)   Chronic respiratory failure (HCC)   Chronic diastolic heart failure (HCC)  Acute on chronic respiratory failure/COPD with acute exacerbation: Continue IV Solu-Medrol Increase nebs DuoNeb from 3 times daily to 4 times daily.  Add nebs Pulmicort twice daily.  Start flutter valve device.  Continue antibiotics Continue supplemental oxygen.  Further management will depend on hospital course.   Hypertension: Control is much improved. Last documented blood pressure was 124/55 mmHg.  Diastolic congestive heart failure: Stable   DVT prophylaxis: Subcut Lovenox Code Status: Partial code Family Communication:  Disposition Plan: Will depend on hospital course   Consultants:   None  Procedures:   None  Antimicrobials:   Doxycycline   Subjective: Patient continues to report not feeling well Patient continues to report a respiratory  cultures.  Objective: Vitals:   06/22/18 0449 06/22/18 0831 06/22/18 1324 06/22/18 1412  BP: 115/64  (!) 124/55   Pulse: 66  (!) 59   Resp: (!) 24  16   Temp: 98.6 F (37 C)     TempSrc: Oral     SpO2: 95% 95% 90% 92%    Intake/Output Summary (Last 24 hours) at 06/22/2018 1443 Last data filed at 06/22/2018 1358 Gross per 24 hour  Intake 237 ml  Output 850 ml  Net -613 ml   There were no vitals filed for this visit.  Examination:  General exam: Appears calm and comfortable.  Morbidly obese. Respiratory system: Decreased air entry globally. Cardiovascular system: S1 & S2  Gastrointestinal system: Morbidly obese, soft and nontender.  Organs are difficult to assess. Central nervous system: Awake and alert.  Patient moves all limbs.  Data Reviewed: I have personally reviewed following labs and imaging studies  CBC: Recent Labs  Lab 06/18/18 1311 06/19/18 0501  WBC 3.9* 3.2*  NEUTROABS 2.0  --   HGB 15.0 13.8  HCT 51.4* 45.8  MCV 106.9* 106.3*  PLT 241 542   Basic Metabolic Panel: Recent Labs  Lab 06/18/18 1311 06/19/18 0733 06/21/18 0410  NA 142 143 143  K 4.1 4.5 4.4  CL 101 102 95*  CO2 36* 35* 39*  GLUCOSE 125* 164* 137*  BUN 9 8 21   CREATININE 0.69 0.54 0.66  CALCIUM 8.6* 8.4* 8.8*   GFR: CrCl cannot be calculated (Unknown ideal weight.). Liver Function Tests: Recent Labs  Lab 06/18/18 1311 06/21/18 0410  AST 44* 38  ALT 52* 82*  ALKPHOS 52 48  BILITOT 1.0 0.6  PROT 7.5 6.9  ALBUMIN 3.9 3.5   No results  for input(s): LIPASE, AMYLASE in the last 168 hours. No results for input(s): AMMONIA in the last 168 hours. Coagulation Profile: No results for input(s): INR, PROTIME in the last 168 hours. Cardiac Enzymes: No results for input(s): CKTOTAL, CKMB, CKMBINDEX, TROPONINI in the last 168 hours. BNP (last 3 results) No results for input(s): PROBNP in the last 8760 hours. HbA1C: No results for input(s): HGBA1C in the last 72 hours. CBG: No  results for input(s): GLUCAP in the last 168 hours. Lipid Profile: No results for input(s): CHOL, HDL, LDLCALC, TRIG, CHOLHDL, LDLDIRECT in the last 72 hours. Thyroid Function Tests: No results for input(s): TSH, T4TOTAL, FREET4, T3FREE, THYROIDAB in the last 72 hours. Anemia Panel: No results for input(s): VITAMINB12, FOLATE, FERRITIN, TIBC, IRON, RETICCTPCT in the last 72 hours. Urine analysis:    Component Value Date/Time   COLORURINE YELLOW 11/14/2014 2021   APPEARANCEUR CLEAR 11/14/2014 2021   LABSPEC 1.004 (L) 11/14/2014 2021   PHURINE 5.5 11/14/2014 2021   GLUCOSEU NEGATIVE 11/14/2014 2021   HGBUR NEGATIVE 11/14/2014 2021   [place] NEGATIVE 11/14/2014 2021   KETONESUR NEGATIVE 11/14/2014 2021   PROTEINUR NEGATIVE 11/14/2014 2021   UROBILINOGEN 1.0 11/14/2014 2021   NITRITE NEGATIVE 11/14/2014 2021   LEUKOCYTESUR NEGATIVE 11/14/2014 2021   Sepsis Labs: @LABRCNTIP (procalcitonin:4,lacticidven:4)  )No results found for this or any previous visit (from the past 240 hour(s)).       Radiology Studies: No results found.      Scheduled Meds: . diltiazem  60 mg Oral BID  . doxycycline  100 mg Oral Q12H  . enoxaparin (LOVENOX) injection  40 mg Subcutaneous Q24H  . feeding supplement (ENSURE ENLIVE)  237 mL Oral BID BM  . guaiFENesin  600 mg Oral BID  . ipratropium-albuterol  3 mL Nebulization TID  . methylPREDNISolone (SOLU-MEDROL) injection  60 mg Intravenous Q6H  . metoprolol tartrate  12.5 mg Oral BID  . multivitamin with minerals  1 tablet Oral Daily   Continuous Infusions:   LOS: 2 days    Time spent: 35 minutes    Dana Allan, MD  [facility] Pager #: [phone] 7PM-7AM contact night coverage as above

## 2018-06-23 NOTE — Progress Notes (Signed)
Pt sleepy during instruction with Flutter Valve, will have day shift reinforce instruction when Pt awake for morning treatment.

## 2018-06-23 NOTE — Care Management Important Message (Signed)
Important Message  Patient Details  Name: [name] MRN: [id number] Date of Birth: 1932/03/27   Medicare Important Message Given:  Yes    Kerin Salen 06/23/2018, 10:55 AMImportant Message  Patient Details  Name: [name] MRN: [id number] Date of Birth: 07-04-1931   Medicare Important Message Given:  Yes    Kerin Salen 06/23/2018, 10:55 AM

## 2018-06-23 NOTE — Progress Notes (Signed)
PROGRESS NOTE    [name]  QIW:[id number] DOB: 08/27/1931 DOA: 06/18/2018 PCP: Patient, No Pcp Per  Outpatient Specialists:   Brief Narrative:  Patient admitted with COPD exacerbation.  [name] is an 85 year old African-American female with past medical history significant for chronic COPD O2 dependent on 3 L/min nasal cannula at home, diastolic congestive heart failure hypertension hyperlipidemia GERD.  She was admitted due to dyspnea despite her nebulizer and oxygen at home.  As per prior documentation - "06/21/2018: Overnight, patient went into acute respiratory distress and had hypertensive crisis with blood pressure going up to the 206/134.  Her oxygen was increased to 5 L/min by nasal cannula and her CODE STATUS was changed to partial code".  06/23/2018: Seen.  Patient is slowly improving.  We will continue current management.  Will consult PT OT.  Disposition will depend on above.  Assessment & Plan:   Principal Problem:   COPD exacerbation ([place]) Active Problems:   Dyslipidemia   Essential hypertension   COPD (chronic obstructive pulmonary disease) (HCC)   SVT (supraventricular tachycardia) (HCC)   GERD (gastroesophageal reflux disease)   Chronic respiratory failure (HCC)   Chronic diastolic heart failure (HCC)  Acute on chronic respiratory failure/COPD with acute exacerbation: Continue IV Solu-Medrol Increase nebs DuoNeb from 3 times daily to 4 times daily.  Add nebs Pulmicort twice daily.  Start flutter valve device.  Continue antibiotics Continue supplemental oxygen.  Further management will depend on hospital course.  06/23/2018: Patient is slowly improving.  Physical therapy consulted.  Continue current management.  Hypertension: Control is much improved. Last documented blood pressure was 124/55 mmHg.  Diastolic congestive heart failure: Stable   DVT prophylaxis: Subcut Lovenox Code Status: Partial code Family Communication:  Disposition Plan:  Will depend on hospital course   Consultants:   None  Procedures:   None  Antimicrobials:   Doxycycline   Subjective: Patient is slowly improving.  Objective: Vitals:   06/23/18 1019 06/23/18 1308 06/23/18 1308 06/23/18 1401  BP: (!) 148/82 (!) 128/58 (!) 128/58   Pulse:  (!) 119 (!) 50   Resp:  18 18   Temp:  98.1 F (36.7 C) 98.1 F (36.7 C)   TempSrc:  Oral Oral   SpO2: 95% 94% 95% 95%    Intake/Output Summary (Last 24 hours) at 06/23/2018 1406 Last data filed at 06/23/2018 1310 Gross per 24 hour  Intake 957 ml  Output 1075 ml  Net -118 ml   There were no vitals filed for this visit.  Examination:  General exam: Appears calm and comfortable.  Morbidly obese. Respiratory system: Decreased air entry globally. Cardiovascular system: S1 & S2  Gastrointestinal system: Morbidly obese, soft and nontender.  Organs are difficult to assess. Central nervous system: Awake and alert.  Patient moves all limbs.  Data Reviewed: I have personally reviewed following labs and imaging studies  CBC: Recent Labs  Lab 06/18/18 1311 06/19/18 0501  WBC 3.9* 3.2*  NEUTROABS 2.0  --   HGB 15.0 13.8  HCT 51.4* 45.8  MCV 106.9* 106.3*  PLT 241 417   Basic Metabolic Panel: Recent Labs  Lab 06/18/18 1311 06/19/18 0733 06/21/18 0410  NA 142 143 143  K 4.1 4.5 4.4  CL 101 102 95*  CO2 36* 35* 39*  GLUCOSE 125* 164* 137*  BUN 9 8 21   CREATININE 0.69 0.54 0.66  CALCIUM 8.6* 8.4* 8.8*   GFR: CrCl cannot be calculated (Unknown ideal weight.). Liver Function Tests: Recent Labs  Lab  06/18/18 1311 06/21/18 0410  AST 44* 38  ALT 52* 82*  ALKPHOS 52 48  BILITOT 1.0 0.6  PROT 7.5 6.9  ALBUMIN 3.9 3.5   No results for input(s): LIPASE, AMYLASE in the last 168 hours. No results for input(s): AMMONIA in the last 168 hours. Coagulation Profile: No results for input(s): INR, PROTIME in the last 168 hours. Cardiac Enzymes: No results for input(s): CKTOTAL, CKMB,  CKMBINDEX, TROPONINI in the last 168 hours. BNP (last 3 results) No results for input(s): PROBNP in the last 8760 hours. HbA1C: No results for input(s): HGBA1C in the last 72 hours. CBG: No results for input(s): GLUCAP in the last 168 hours. Lipid Profile: No results for input(s): CHOL, HDL, LDLCALC, TRIG, CHOLHDL, LDLDIRECT in the last 72 hours. Thyroid Function Tests: No results for input(s): TSH, T4TOTAL, FREET4, T3FREE, THYROIDAB in the last 72 hours. Anemia Panel: No results for input(s): VITAMINB12, FOLATE, FERRITIN, TIBC, IRON, RETICCTPCT in the last 72 hours. Urine analysis:    Component Value Date/Time   COLORURINE YELLOW 11/14/2014 2021   APPEARANCEUR CLEAR 11/14/2014 2021   LABSPEC 1.004 (L) 11/14/2014 2021   PHURINE 5.5 11/14/2014 2021   GLUCOSEU NEGATIVE 11/14/2014 2021   HGBUR NEGATIVE 11/14/2014 2021   [place] NEGATIVE 11/14/2014 2021   KETONESUR NEGATIVE 11/14/2014 2021   PROTEINUR NEGATIVE 11/14/2014 2021   UROBILINOGEN 1.0 11/14/2014 2021   NITRITE NEGATIVE 11/14/2014 2021   LEUKOCYTESUR NEGATIVE 11/14/2014 2021   Sepsis Labs: @LABRCNTIP (procalcitonin:4,lacticidven:4)  )No results found for this or any previous visit (from the past 240 hour(s)).       Radiology Studies: No results found.      Scheduled Meds: . budesonide (PULMICORT) nebulizer solution  0.25 mg Nebulization BID  . diltiazem  60 mg Oral BID  . doxycycline  100 mg Oral Q12H  . enoxaparin (LOVENOX) injection  40 mg Subcutaneous Q24H  . feeding supplement (ENSURE ENLIVE)  237 mL Oral BID BM  . guaiFENesin  600 mg Oral BID  . ipratropium-albuterol  3 mL Nebulization Q6H  . methylPREDNISolone (SOLU-MEDROL) injection  60 mg Intravenous Q6H  . metoprolol tartrate  12.5 mg Oral BID  . multivitamin with minerals  1 tablet Oral Daily   Continuous Infusions:   LOS: 3 days    Time spent: 25 minutes    Dana Allan, MD  [facility] Pager #: [phone] 7PM-7AM  contact night coverage as above

## 2018-06-23 NOTE — Plan of Care (Signed)
Pt remains stable though overall weak and tired. Pt also remains short of breath with activity intolerance to minimal activity in bed. No s/s of distress. Pt denies pain. Rn encouraged pt to take good deep breaths and use incentive spirometer and flutter valve. Overall lung sounds are diminshed. Rn will continue to monitor.

## 2018-06-23 NOTE — Progress Notes (Signed)
Spoke to dtr(Linda) per patient request-informed Vaughan Basta that PT/OT will eval-await recc-dtr states she works from 6a-2p-patient has 3n1,rw-dtr agrees to whatever the recc from PT/OT is. Patient already gets home 02-[facility]-dtr will bring travel tank from home @ d/c.[facility] chosen rep Santiago Glad already following-if needed-recc HHRN/PT/OT/aide/csw. May need PTAR.

## 2018-06-23 NOTE — Evaluation (Signed)
Physical Therapy Evaluation Patient Details Name: [name] MRN: [id number] DOB: 1931-08-26 Today's Date: 06/23/2018   History of Present Illness  Patient admitted with COPD exacerbation.  [name] is an 85 year old African-American female with past medical history significant for chronic COPD O2 dependent on 3 L/min nasal cannula at home, diastolic congestive heart failure hypertension hyperlipidemia GERD.  She was admitted due to dyspnea despite her nebulizer and oxygen at home.  Clinical Impression  Patient presents with decreased independence with mobility due to decreased activity tolerance, generalized weakness, decreased balance, decreased safety awareness and will benefit from skilled PT in the acute setting to allow d/c home with family assist.  IF daughter is not going to be home with pt initially 24 hours may need to consider SNF level rehab.    Follow Up Recommendations Home health PT;Supervision/Assistance - 24 hour    Equipment Recommendations  None recommended by PT    Recommendations for Other Services       Precautions / Restrictions Precautions Precautions: Fall Precaution Comments: oxygen dependent      Mobility  Bed Mobility Overal bed mobility: Needs Assistance Bed Mobility: Supine to Sit     Supine to sit: Min assist     General bed mobility comments: pulls up to sit with therapist hand  Transfers Overall transfer level: Needs assistance Equipment used: 4-wheeled walker Transfers: Sit to/from Stand Sit to Stand: Min guard         General transfer comment: stands impulsively pulling up on locked rollator; assist to lower safely to toilet in bathroom with cues for hand placement and to sit in recliner safely as backing with rollator without looking back or noting how close she is to chair  Ambulation/Gait Ambulation/Gait assistance: Min guard Gait Distance (Feet): 65 Feet Assistive device: 4-wheeled walker Gait Pattern/deviations:  Step-through pattern;Decreased stride length;Trunk flexed;Shuffle     General Gait Details: leans on rollator with elbows, maintained on 3L O2 throughout, SpO2 at lowest measured 86%, recovered to 91% in <2 minutes.  Patient using tripod technique with elbows on walker versus B/S table versus knees  Stairs            Wheelchair Mobility    Modified Rankin (Stroke Patients Only)       Balance Overall balance assessment: Needs assistance Sitting-balance support: Feet supported Sitting balance-Leahy Scale: Fair Sitting balance - Comments: initially without UE support at EOB, then with dyspnea tripod technique for using accessory mm    Standing balance support: Bilateral upper extremity supported Standing balance-Leahy Scale: Poor Standing balance comment: UE support and assist for balance, assist for perineal hygiene                             Pertinent Vitals/Pain Pain Assessment: No/denies pain    Home Living Family/patient expects to be discharged to:: Private residence Living Arrangements: Children(daughter) Available Help at Discharge: Family;Available 24 hours/day(daughter works 5 am-2:30 pm but planning to take a week off) Type of Home: Apartment Home Access: Stairs to enter Entrance Stairs-Rails: Right Entrance Stairs-Number of Steps: 12 Home Layout: One level Home Equipment: Shower seat;Cane - single point;Walker - 4 wheels      Prior Function Level of Independence: Needs assistance   Gait / Transfers Assistance Needed: used rollator and needed assist with shower, for bathroom trips, reports waited for daughter to come home to go to bathroom despite having BSC;  Hand Dominance        Extremity/Trunk Assessment   Upper Extremity Assessment Upper Extremity Assessment: Generalized weakness    Lower Extremity Assessment Lower Extremity Assessment: Generalized weakness    Cervical / Trunk Assessment Cervical / Trunk  Assessment: Kyphotic  Communication   Communication: No difficulties  Cognition Arousal/Alertness: Awake/alert Behavior During Therapy: WFL for tasks assessed/performed Overall Cognitive Status: No family/caregiver present to determine baseline cognitive functioning                                        General Comments General comments (skin integrity, edema, etc.): reports she will have initial 24 hour assist, but pt not reliable historian    Exercises     Assessment/Plan    PT Assessment Patient needs continued PT services  PT Problem List Decreased strength;Decreased activity tolerance;Cardiopulmonary status limiting activity;Decreased mobility;Decreased knowledge of precautions;Decreased balance       PT Treatment Interventions DME instruction;Therapeutic exercise;Gait training;Balance training;Stair training;Functional mobility training;Therapeutic activities;Patient/family education    PT Goals (Current goals can be found in the Care Plan section)  Acute Rehab PT Goals Patient Stated Goal: to go home PT Goal Formulation: With patient Time For Goal Achievement: 06/30/18 Potential to Achieve Goals: Fair    Frequency Min 3X/week   Barriers to discharge        Co-evaluation               AM-PAC PT "6 Clicks" Mobility  Outcome Measure Help needed turning from your back to your side while in a flat bed without using bedrails?: A Little Help needed moving from lying on your back to sitting on the side of a flat bed without using bedrails?: A Little Help needed moving to and from a bed to a chair (including a wheelchair)?: A Little Help needed standing up from a chair using your arms (e.g., wheelchair or bedside chair)?: A Little Help needed to walk in hospital room?: A Little Help needed climbing 3-5 steps with a railing? : A Lot 6 Click Score: 17    End of Session Equipment Utilized During Treatment: Gait belt;Oxygen Activity Tolerance:  Patient limited by fatigue Patient left: in chair;with call bell/phone within reach Nurse Communication: Mobility status;Other (comment)(sitting edge of chair, need for new pure wick) PT Visit Diagnosis: Unsteadiness on feet (R26.81);Other abnormalities of gait and mobility (R26.89);Other symptoms and signs involving the nervous system (R29.898)    Time: 2248-2500 PT Time Calculation (min) (ACUTE ONLY): 40 min   Charges:   PT Evaluation $PT Eval Moderate Complexity: 1 Mod PT Treatments $Gait Training: 23-37 mins        Magda Kiel, [place] Acute Rehabilitation Services [phone] 06/23/2018   Reginia Naas 06/23/2018, 4:40 PM

## 2018-06-24 DIAGNOSIS — J9621 Acute and chronic respiratory failure with hypoxia: Secondary | ICD-10-CM | POA: Diagnosis present

## 2018-06-24 DIAGNOSIS — J9622 Acute and chronic respiratory failure with hypercapnia: Secondary | ICD-10-CM

## 2018-06-24 MED ORDER — METHYLPREDNISOLONE SODIUM SUCC 125 MG IJ SOLR
60.0000 mg | Freq: Four times a day (QID) | INTRAMUSCULAR | Status: DC
  Administered 2018-06-25: 60 mg via INTRAVENOUS
  Filled 2018-06-24: qty 2

## 2018-06-24 NOTE — Progress Notes (Addendum)
PROGRESS NOTE    [name]  AVW:[id number] DOB: Jun 01, 1932 DOA: 06/18/2018 PCP: Patient, No Pcp Per  Outpatient Specialists:    Presentation Summary: [name] is a 85 y.o. female with medical history significant of chronic COPD on 3 L nasal cannula at baseline, chronic diastolic heart failure, hypertension, hyperlipidemia, GERD into the ER with dyspnea.  Patient reports getting progressively worse over the last several days.  She reported wheezing, no improvement with her current home medications, worsening shortness of breath with exertion and general malaise.  In the ER patient's presentation was consistent with COPD exacerbation with ER course as follows.  ED Course: The ER patient received duo nebs, steroids and 25 mg IV x1, and a chest x-ray which was unremarkable for acute cardiopulmonary disease.  They attempted to ambulate the patient to discharge but patient became hypoxic and short of breath with minimal ambulation.     Brief Narrative:  Patient admitted with COPD exacerbation.  [name] is an 85 year old African-American female with past medical history significant for chronic COPD O2 dependent on 3 L/min nasal cannula at home, diastolic congestive heart failure hypertension hyperlipidemia GERD.  She was admitted due to dyspnea despite her nebulizer and oxygen at home on 06/18/18.  As per prior documentation - "06/21/2018: Overnight, patient went into acute respiratory distress and had hypertensive crisis with blood pressure going up to the 206/134.  Her oxygen was increased to 5 L/min by nasal cannula and her CODE STATUS was changed to partial code".  06/23/2018: Seen.  Patient is slowly improving.  We will continue current management.  Will consult PT OT.  Disposition will depend on above.  Assessment & Plan:   Principal Problem:   COPD exacerbation ([place]) Active Problems:   Dyslipidemia   Essential hypertension   COPD (chronic obstructive pulmonary disease) ([facility])   SVT (supraventricular tachycardia) (H[facility])   GERD (gastroesophageal reflux disease)   Chronic respiratory failure ([facility])   Chronic diastolic heart failure ([facility])   Home meds:  - aspirin 81  - diltiazem 60 bid/ metoprolol 12.5 bid  - budesonide 0.5mg  bid/ ipratropium-albuterol 32ml qid/ guaifenesin 1.2gm bid/ albuterol nebs and hfa prn  - prns   Hospital Course:  Acute on chronic respiratory failure/COPD with acute exacerbation: this is D#7 in the hospital. Today pt doesn't look that good.  RR 26.  Air movement marginal.  Not in distress. Will d/w family in am.  Hx CO2 retainer back in 2012.  - Decrease IV Solu-Medrol to 60 mg bid - Cont duoneb and Pulmicort - Cont flutter valve device.  - Continue po doxy - Continue supplemental oxygen  - Seen by PT, will need HHPT as dc, if daughter is not available at home may need SNF  Hypertension: - BP much better home regimen = diltiazem 60 bid and metoprolol 82.9 bid   Diastolic congestive heart failure: Stable  Partial Code: no CPR / no intubation / no defib/ shock / no Bipap --- yes call a code / yes to medications   DVT prophylaxis: SQ Lovenox Code Status: Partial code Family Communication: none here today Disposition Plan: Will depend on hospital course  Kelly Splinter MD [facility] pgr [phone] 06/24/2018, 5:14 PM   Subjective: Patient is slowly improving.  Objective: Vitals:   06/24/18 0924 06/24/18 1055 06/24/18 1326 06/24/18 1425  BP:   119/67   Pulse:   70   Resp:   (!) 26   Temp:  97.7 F (36.5 C) 98.6  F (37 C)   TempSrc:  Oral Oral   SpO2: 94%   90%    Intake/Output Summary (Last 24 hours) at 06/24/2018 1658 Last data filed at 06/24/2018 1329 Gross per 24 hour  Intake 1020 ml  Output 1125 ml  Net -105 ml   There were no vitals filed for this visit.  Examination:  General exam: Appears calm and comfortable.  Morbidly obese. Respiratory system: Decreased air entry  globally. Cardiovascular system: S1 & S2  Gastrointestinal system: Morbidly obese, soft and nontender.  Organs are difficult to assess. Central nervous system: Awake and alert.  Patient moves all limbs.  Data Reviewed: I have personally reviewed following labs and imaging studies  CBC: Recent Labs  Lab 06/18/18 1311 06/19/18 0501  WBC 3.9* 3.2*  NEUTROABS 2.0  --   HGB 15.0 13.8  HCT 51.4* 45.8  MCV 106.9* 106.3*  PLT 241 937   Basic Metabolic Panel: Recent Labs  Lab 06/18/18 1311 06/19/18 0733 06/21/18 0410  NA 142 143 143  K 4.1 4.5 4.4  CL 101 102 95*  CO2 36* 35* 39*  GLUCOSE 125* 164* 137*  BUN 9 8 21   CREATININE 0.69 0.54 0.66  CALCIUM 8.6* 8.4* 8.8*   GFR: CrCl cannot be calculated (Unknown ideal weight.). Liver Function Tests: Recent Labs  Lab 06/18/18 1311 06/21/18 0410  AST 44* 38  ALT 52* 82*  ALKPHOS 52 48  BILITOT 1.0 0.6  PROT 7.5 6.9  ALBUMIN 3.9 3.5   No results for input(s): LIPASE, AMYLASE in the last 168 hours. No results for input(s): AMMONIA in the last 168 hours. Coagulation Profile: No results for input(s): INR, PROTIME in the last 168 hours. Cardiac Enzymes: No results for input(s): CKTOTAL, CKMB, CKMBINDEX, TROPONINI in the last 168 hours. BNP (last 3 results) No results for input(s): PROBNP in the last 8760 hours. HbA1C: No results for input(s): HGBA1C in the last 72 hours. CBG: No results for input(s): GLUCAP in the last 168 hours. Lipid Profile: No results for input(s): CHOL, HDL, LDLCALC, TRIG, CHOLHDL, LDLDIRECT in the last 72 hours. Thyroid Function Tests: No results for input(s): TSH, T4TOTAL, FREET4, T3FREE, THYROIDAB in the last 72 hours. Anemia Panel: No results for input(s): VITAMINB12, FOLATE, FERRITIN, TIBC, IRON, RETICCTPCT in the last 72 hours. Urine analysis:    Component Value Date/Time   COLORURINE YELLOW 11/14/2014 2021   APPEARANCEUR CLEAR 11/14/2014 2021   LABSPEC 1.004 (L) 11/14/2014 2021   PHURINE  5.5 11/14/2014 2021   GLUCOSEU NEGATIVE 11/14/2014 2021   HGBUR NEGATIVE 11/14/2014 2021   [place] NEGATIVE 11/14/2014 2021   KETONESUR NEGATIVE 11/14/2014 2021   PROTEINUR NEGATIVE 11/14/2014 2021   UROBILINOGEN 1.0 11/14/2014 2021   NITRITE NEGATIVE 11/14/2014 2021   LEUKOCYTESUR NEGATIVE 11/14/2014 2021   Sepsis Labs: @LABRCNTIP (procalcitonin:4,lacticidven:4)  )No results found for this or any previous visit (from the past 240 hour(s)).       Radiology Studies: No results found.      Scheduled Meds: . budesonide (PULMICORT) nebulizer solution  0.25 mg Nebulization BID  . diltiazem  60 mg Oral BID  . doxycycline  100 mg Oral Q12H  . enoxaparin (LOVENOX) injection  40 mg Subcutaneous Q24H  . feeding supplement (ENSURE ENLIVE)  237 mL Oral BID BM  . guaiFENesin  600 mg Oral BID  . ipratropium-albuterol  3 mL Nebulization Q6H  . methylPREDNISolone (SOLU-MEDROL) injection  60 mg Intravenous Q6H  . metoprolol tartrate  12.5 mg Oral BID  . multivitamin  with minerals  1 tablet Oral Daily   Continuous Infusions:   LOS: 4 days

## 2018-06-24 NOTE — Evaluation (Addendum)
Occupational Therapy Evaluation Patient Details Name: [name] MRN: [id number] DOB: 24-Mar-1932 Today's Date: 06/24/2018    History of Present Illness Patient admitted with COPD exacerbation.  [name] is an 85 year old African-American female with past medical history significant for chronic COPD O2 dependent on 3 L/min nasal cannula at home, diastolic congestive heart failure hypertension hyperlipidemia GERD.  She was admitted due to dyspnea despite her nebulizer and oxygen at home.   Clinical Impression   Pt was admitted for the above. She states that prior to admission, she was mod I for adls, including showering, sitting on shower chair.  Pt with increased dyspnea/decreased sats during eval and required extended rest breaks. She does utilize energy conservation and PLB techniques. Will follow in acute setting with min A level goals. She currently needs max to total A for adls    Follow Up Recommendations  Supervision/Assistance - 24 hour;SNF    Equipment Recommendations  None recommended by OT    Recommendations for Other Services       Precautions / Restrictions Precautions Precautions: Fall Precaution Comments: oxygen dependent Restrictions Weight Bearing Restrictions: No      Mobility Bed Mobility         Supine to sit: Min assist Sit to supine: Min guard   General bed mobility comments: HOB RAISED.  assist for trunk to eob  Transfers   Equipment used: Rolling walker (2 wheeled)   Sit to Stand: Min guard         General transfer comment: for safety    Balance                                           ADL either performed or assessed with clinical judgement   ADL Overall ADL's : Needs assistance/impaired Eating/Feeding: Independent   Grooming: Wash/dry hands;Wash/dry face;Set up   Upper Body Bathing: Maximal assistance   Lower Body Bathing: Maximal assistance   Upper Body Dressing : Maximal assistance   Lower Body  Dressing: Total assistance   Toilet Transfer: Min guard;Stand-pivot;BSC   Toileting- Clothing Manipulation and Hygiene: Total assistance;Sit to/from stand         General ADL Comments: needs increased assistance due to dyspnea. Pt does initiate rest breaks and PLB     Vision         Perception     Praxis      Pertinent Vitals/Pain Pain Assessment: No/denies pain     Hand Dominance     Extremity/Trunk Assessment Upper Extremity Assessment Upper Extremity Assessment: Generalized weakness           Communication Communication Communication: No difficulties   Cognition Arousal/Alertness: Awake/alert Behavior During Therapy: WFL for tasks assessed/performed Overall Cognitive Status: No family/caregiver present to determine baseline cognitive functioning                                     General Comments  sats 79 after transfer on 3 liters.  during adl, mostly 90-92%    Exercises     Shoulder Instructions      Home Living Family/patient expects to be discharged to:: Private residence Living Arrangements: Children                 Bathroom Shower/Tub: Teacher, early years/pre: Standard  Home Equipment: Shower seat;Bedside commode          Prior Functioning/Environment Level of Independence: Needs assistance  Gait / Transfers Assistance Needed: used rollator and needed assist with shower, for bathroom trips, reports waited for daughter to come home to go to bathroom despite having BSC;               OT Problem List: Decreased strength;Decreased activity tolerance;Impaired balance (sitting and/or standing);Pain;Cardiopulmonary status limiting activity;Decreased knowledge of use of DME or AE;Decreased safety awareness      OT Treatment/Interventions: Self-care/ADL training;DME and/or AE instruction;Energy conservation;Patient/family education;Balance training;Therapeutic activities    OT Goals(Current goals can be  found in the care plan section) Acute Rehab OT Goals Patient Stated Goal: to go home OT Goal Formulation: With patient Time For Goal Achievement: 07/08/18 Potential to Achieve Goals: Good ADL Goals Pt Will Transfer to Toilet: with supervision;bedside commode;stand pivot transfer Pt Will Perform Toileting - Clothing Manipulation and hygiene: with min assist;sit to/from stand Additional ADL Goal #1: pt will perform LB adls with min A, sit to stand  OT Frequency: Min 2X/week   Barriers to D/C:            Co-evaluation              AM-PAC OT "6 Clicks" Daily Activity     Outcome Measure Help from another person eating meals?: None Help from another person taking care of personal grooming?: A Little Help from another person toileting, which includes using toliet, bedpan, or urinal?: A Lot Help from another person bathing (including washing, rinsing, drying)?: A Lot Help from another person to put on and taking off regular upper body clothing?: A Lot Help from another person to put on and taking off regular lower body clothing?: Total 6 Click Score: 14   End of Session    Activity Tolerance: (dyspnea) Patient left: in bed;with call bell/phone within reach;with bed alarm set  OT Visit Diagnosis: Muscle weakness (generalized) (M62.81)                Time: 5397-6734 OT Time Calculation (min): 32 min Charges:  OT General Charges $OT Visit: 1 Visit OT Evaluation $OT Eval Moderate Complexity: 1 Mod OT Treatments $Self Care/Home Management : 8-22 mins  Lesle Chris, OTR/L Acute Rehabilitation Services [phone] [facility] pager [phone] office 06/24/2018  [place] 06/24/2018, 10:47 AM

## 2018-06-25 ENCOUNTER — Inpatient Hospital Stay (HOSPITAL_COMMUNITY): Payer: Medicare Other

## 2018-06-25 DIAGNOSIS — J9622 Acute and chronic respiratory failure with hypercapnia: Secondary | ICD-10-CM

## 2018-06-25 DIAGNOSIS — J9621 Acute and chronic respiratory failure with hypoxia: Secondary | ICD-10-CM

## 2018-06-25 DIAGNOSIS — K219 Gastro-esophageal reflux disease without esophagitis: Secondary | ICD-10-CM

## 2018-06-25 DIAGNOSIS — J441 Chronic obstructive pulmonary disease with (acute) exacerbation: Principal | ICD-10-CM

## 2018-06-25 LAB — CREATININE, SERUM
CREATININE: 0.87 mg/dL (ref 0.44–1.00)
GFR calc Af Amer: >60 mL/min (ref >60)
GFR calc non Af Amer: >60 mL/min (ref >60)

## 2018-06-25 LAB — CBC
HCT: 46.7 % — ABNORMAL HIGH (ref 36.0–46.0)
Hemoglobin: 14 g/dL (ref 12.0–15.0)
MCH: 31 pg (ref 26.0–34.0)
MCHC: 30 g/dL (ref 30.0–36.0)
MCV: 103.5 fL — ABNORMAL HIGH (ref 80.0–100.0)
Platelets: 195 10*3/uL (ref 150–400)
RBC: 4.51 MIL/uL (ref 3.87–5.11)
RDW: 12.4 % (ref 11.5–15.5)
WBC: 6.3 10*3/uL (ref 4.0–10.5)
nRBC: 0 % (ref 0.0–0.2)

## 2018-06-25 LAB — BRAIN NATRIURETIC PEPTIDE: B Natriuretic Peptide: 153.3 pg/mL — ABNORMAL HIGH (ref 0.0–100.0)

## 2018-06-25 LAB — D-DIMER, QUANTITATIVE (NOT AT ARMC): D DIMER QUANT: 0.91 ug{FEU}/mL — AB (ref 0.00–0.50)

## 2018-06-25 MED ORDER — BISACODYL 10 MG RE SUPP
10.0000 mg | Freq: Every day | RECTAL | Status: DC | PRN
  Filled 2018-06-25: qty 1

## 2018-06-25 MED ORDER — METHYLPREDNISOLONE SODIUM SUCC 40 MG IJ SOLR: 40.0000 mg | Freq: Two times a day (BID) | INTRAMUSCULAR | Status: DC

## 2018-06-25 MED ORDER — PANTOPRAZOLE SODIUM 40 MG PO TBEC
40.0000 mg | DELAYED_RELEASE_TABLET | Freq: Two times a day (BID) | ORAL | Status: DC
  Administered 2018-06-25 – 2018-07-02 (×14): 40 mg via ORAL
  Filled 2018-06-25 (×14): qty 1

## 2018-06-25 MED ORDER — DM-GUAIFENESIN ER 30-600 MG PO TB12
2.0000 | ORAL_TABLET | Freq: Two times a day (BID) | ORAL | Status: DC
  Administered 2018-06-25 – 2018-07-02 (×14): 2 via ORAL
  Filled 2018-06-25 (×14): qty 2

## 2018-06-25 MED ORDER — METHYLPREDNISOLONE SODIUM SUCC 125 MG IJ SOLR
80.0000 mg | Freq: Two times a day (BID) | INTRAMUSCULAR | Status: DC
  Administered 2018-06-25 – 2018-07-01 (×12): 80 mg via INTRAVENOUS
  Filled 2018-06-25 (×12): qty 2

## 2018-06-25 MED ORDER — DOCUSATE SODIUM 100 MG PO CAPS
100.0000 mg | ORAL_CAPSULE | Freq: Two times a day (BID) | ORAL | Status: DC
  Administered 2018-06-25 – 2018-07-02 (×15): 100 mg via ORAL
  Filled 2018-06-25 (×15): qty 1

## 2018-06-25 NOTE — Progress Notes (Signed)
PT Cancellation Note  Patient Details Name: [name] MRN: [id number] DOB: 20-Sep-1931   Cancelled Treatment:     Pt declined any OOB activity as she just got back into bed from using BSC with concerns about her stool softner "kickin in".  Pt has been evaluated with rec for [facility] PT and 14/7 supervision.  Will attempt to see tomorrow.    Rica Koyanagi  PTA Acute  Rehabilitation Services Pager      [phone] Office      [phone]

## 2018-06-25 NOTE — Progress Notes (Signed)
PROGRESS NOTE    [name]  KNL:[id number] DOB: April 22, 1932 DOA: 06/18/2018 PCP: Patient, No Pcp Per  Outpatient Specialists:    Presentation Summary: [name] is a 85 y.o. female with medical history significant of chronic COPD on 3 L nasal cannula at baseline, chronic diastolic heart failure, hypertension, hyperlipidemia, GERD into the ER with dyspnea.  Patient reports getting progressively worse over the last several days.  She reported wheezing, no improvement with her current home medications, worsening shortness of breath with exertion and general malaise.  In the ER patient's presentation was consistent with COPD exacerbation with ER course as follows.  ED Course: The ER patient received duo nebs, steroids and 25 mg IV x1, and a chest x-ray which was unremarkable for acute cardiopulmonary disease.  They attempted to ambulate the patient to discharge but patient became hypoxic and short of breath with minimal ambulation.     Assessment & Plan:   Principal Problem:   COPD exacerbation ([place]) Active Problems:   Essential hypertension   COPD (chronic obstructive pulmonary disease) (HCC)   Acute on chronic respiratory failure with hypoxia and hypercapnia (HCC)   Chronic respiratory failure (HCC)   Chronic diastolic heart failure (HCC)   Dyslipidemia   SVT (supraventricular tachycardia) (HCC)   GERD (gastroesophageal reflux disease)   Home meds:  - aspirin 81  - diltiazem 60 bid/ metoprolol 12.5 bid  - budesonide 0.5mg  bid/ ipratropium-albuterol 30ml qid/ guaifenesin 1.2gm bid/ albuterol nebs and hfa prn  - prns   Hospital Course:  Acute on chronic respiratory failure/COPD with acute exacerbation: this is D#7 in the hospital. Not really getting better. Wet cough, sp doxy po 7d. Air movement marginal.  Hx CO2 retainer back to 2012.  ?CHF but no vol excess on exam. ? Needs IV abx - get CXR r/o fluid overload - will consult CCM , appreciate assistance - lower IV Solu-Medrol  to 60 bid - Cont duoneb and Pulmicort - Cont flutter valve device.  - DC po doxy (has had 7d) - Continue supplemental oxygen    Hypertension: - BP much better home regimen = diltiazem 60 bid and metoprolol 79.0 bid   Diastolic congestive heart failure: Stable  Partial Code: no CPR / no intubation / no defib/ shock / no Bipap --- yes call a code / yes to medications  Debility: seen by PT, will need HHPT at dc, however if daughter is not available at home may need SNF   DVT prophylaxis: SQ Lovenox Code Status: Partial code Family Communication: none here today Disposition Plan: Will depend on hospital course  Kelly Splinter MD [facility] pgr [phone] 06/25/2018, 9:58 AM  Subjective: not really feeling better, still coughing, having to work harder to breath still here than at home.  RT here, says had some crackels, ?chf.      Objective: Vitals:   06/24/18 2350 06/25/18 0150 06/25/18 0630 06/25/18 0925  BP:   (!) 121/56   Pulse:   70   Resp:   18   Temp: (P) 98.7 F (37.1 C)  99 F (37.2 C)   TempSrc: (P) Oral  Oral   SpO2:  93% 94% 92%    Intake/Output Summary (Last 24 hours) at 06/25/2018 0958 Last data filed at 06/25/2018 0641 Gross per 24 hour  Intake 1140 ml  Output 1400 ml  Net -260 ml   There were no vitals filed for this visit.  Examination:  General exam: Appears SOB, pursed lips breathing  18- 23 /min, not in distress Respiratory system: Decreased air entry globally, occ crackles R side Cardiovascular system: S1 & S2 no LE or UE edema Gastrointestinal system: Morbidly obese, soft and nontender.  Organs are difficult to assess. Central nervous system: Awake and alert.  Patient moves all limbs.  Data Reviewed: I have personally reviewed following labs and imaging studies  CBC: Recent Labs  Lab 06/18/18 1311 06/19/18 0501 06/25/18 0431  WBC 3.9* 3.2* 6.3  NEUTROABS 2.0  --   --   HGB 15.0 13.8 14.0  HCT 51.4* 45.8 46.7*  MCV  106.9* 106.3* 103.5*  PLT 241 204 300   Basic Metabolic Panel: Recent Labs  Lab 06/18/18 1311 06/19/18 0733 06/21/18 0410 06/25/18 0431  NA 142 143 143  --   K 4.1 4.5 4.4  --   CL 101 102 95*  --   CO2 36* 35* 39*  --   GLUCOSE 125* 164* 137*  --   BUN 9 8 21   --   CREATININE 0.69 0.54 0.66 0.87  CALCIUM 8.6* 8.4* 8.8*  --    GFR: CrCl cannot be calculated (Unknown ideal weight.). Liver Function Tests: Recent Labs  Lab 06/18/18 1311 06/21/18 0410  AST 44* 38  ALT 52* 82*  ALKPHOS 52 48  BILITOT 1.0 0.6  PROT 7.5 6.9  ALBUMIN 3.9 3.5   No results for input(s): LIPASE, AMYLASE in the last 168 hours. No results for input(s): AMMONIA in the last 168 hours. Coagulation Profile: No results for input(s): INR, PROTIME in the last 168 hours. Cardiac Enzymes: No results for input(s): CKTOTAL, CKMB, CKMBINDEX, TROPONINI in the last 168 hours. BNP (last 3 results) No results for input(s): PROBNP in the last 8760 hours. HbA1C: No results for input(s): HGBA1C in the last 72 hours. CBG: No results for input(s): GLUCAP in the last 168 hours. Lipid Profile: No results for input(s): CHOL, HDL, LDLCALC, TRIG, CHOLHDL, LDLDIRECT in the last 72 hours. Thyroid Function Tests: No results for input(s): TSH, T4TOTAL, FREET4, T3FREE, THYROIDAB in the last 72 hours. Anemia Panel: No results for input(s): VITAMINB12, FOLATE, FERRITIN, TIBC, IRON, RETICCTPCT in the last 72 hours. Urine analysis:    Component Value Date/Time   COLORURINE YELLOW 11/14/2014 2021   APPEARANCEUR CLEAR 11/14/2014 2021   LABSPEC 1.004 (L) 11/14/2014 2021   PHURINE 5.5 11/14/2014 2021   GLUCOSEU NEGATIVE 11/14/2014 2021   HGBUR NEGATIVE 11/14/2014 2021   [place] NEGATIVE 11/14/2014 2021   KETONESUR NEGATIVE 11/14/2014 2021   PROTEINUR NEGATIVE 11/14/2014 2021   UROBILINOGEN 1.0 11/14/2014 2021   NITRITE NEGATIVE 11/14/2014 2021   LEUKOCYTESUR NEGATIVE 11/14/2014 2021   Sepsis  Labs: @LABRCNTIP (procalcitonin:4,lacticidven:4)  )No results found for this or any previous visit (from the past 240 hour(s)).       Radiology Studies: No results found.      Scheduled Meds: . budesonide (PULMICORT) nebulizer solution  0.25 mg Nebulization BID  . diltiazem  60 mg Oral BID  . docusate sodium  100 mg Oral BID  . doxycycline  100 mg Oral Q12H  . enoxaparin (LOVENOX) injection  40 mg Subcutaneous Q24H  . feeding supplement (ENSURE ENLIVE)  237 mL Oral BID BM  . guaiFENesin  600 mg Oral BID  . ipratropium-albuterol  3 mL Nebulization Q6H  . methylPREDNISolone (SOLU-MEDROL) injection  40 mg Intravenous Q12H  . metoprolol tartrate  12.5 mg Oral BID  . multivitamin with minerals  1 tablet Oral Daily   Continuous Infusions:   LOS:  5 days

## 2018-06-25 NOTE — Progress Notes (Signed)
PT demonstrated hands on understanding of Flutter device- PC at this time- clear-white thick.
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
## 2018-06-25 NOTE — Progress Notes (Signed)
Labs reviewed from am   D dimer high nl but < 100:   while a normal  or high normal value (seen commonly in the elderly or chronically ill)  may miss small peripheral pe, the clot burden with sob is moderately high and the d dimer  has a very high neg pred value if used in this setting.    BNP also min elevated and not likely chf contributing  Will recheck cxr in am and regroup then   Christinia Gully, MD [facility] [place] [phone] After 5:30 PM or weekends, use Beeper [phone]

## 2018-06-26 ENCOUNTER — Inpatient Hospital Stay (HOSPITAL_COMMUNITY): Payer: Medicare Other

## 2018-06-26 LAB — TROPONIN I
Troponin I: <0.03 ng/mL (ref <0.03)
Troponin I: <0.03 ng/mL (ref <0.03)

## 2018-06-26 MED ORDER — IOHEXOL 300 MG/ML  SOLN
75.0000 mL | Freq: Once | INTRAMUSCULAR | Status: AC | PRN
  Administered 2018-06-26: 75 mL via INTRAVENOUS

## 2018-06-26 MED ORDER — SODIUM CHLORIDE (PF) 0.9 % IJ SOLN
INTRAMUSCULAR | Status: AC
  Filled 2018-06-26: qty 50

## 2018-06-26 MED ORDER — IPRATROPIUM-ALBUTEROL 0.5-2.5 (3) MG/3ML IN SOLN
3.0000 mL | Freq: Four times a day (QID) | RESPIRATORY_TRACT | Status: DC
  Administered 2018-06-26 – 2018-06-30 (×15): 3 mL via RESPIRATORY_TRACT
  Filled 2018-06-26 (×17): qty 3

## 2018-06-26 MED ORDER — BUDESONIDE 0.5 MG/2ML IN SUSP
0.5000 mg | Freq: Two times a day (BID) | RESPIRATORY_TRACT | Status: DC
  Administered 2018-06-26 – 2018-07-02 (×12): 0.5 mg via RESPIRATORY_TRACT
  Filled 2018-06-26 (×13): qty 2

## 2018-06-26 MED ORDER — FUROSEMIDE 10 MG/ML IJ SOLN
40.0000 mg | Freq: Once | INTRAMUSCULAR | Status: AC
  Administered 2018-06-26: 40 mg via INTRAVENOUS
  Filled 2018-06-26: qty 4

## 2018-06-26 NOTE — Progress Notes (Signed)
Christian, RN with Rapid Response here. Respiratory therapist back with ABG results. Patient transferred to 1228 via bed. Navereet, RN went with patient to give report.  Donne Hazel, RN

## 2018-06-26 NOTE — Progress Notes (Signed)
Physical Therapy Treatment Patient Details Name: [name] MRN: [id number] DOB: [birth year] Today's Date: 06/26/2018    History of Present Illness Patient admitted with COPD exacerbation.  [name] is an 85 year old African-American female with past medical history significant for chronic COPD O2 dependent on 3 L/min nasal cannula at home, diastolic congestive heart failure hypertension hyperlipidemia GERD.  She was admitted due to dyspnea despite her nebulizer and oxygen at home.    PT Comments    Pt OOB in recliner via nursing on 4 lts nasal at 94%.  Assisted with amb to and from bathroom only.  General Gait Details: very unsteady gait with limited distance due to dyspnea.  Remained on 4 lts nasal sats lowest 87% during gait.  Poor forward flex posture and shuffled gait.  Unable to amb in hallway due to fatigue returned to recliner.    Follow Up Recommendations  Home health PT;Supervision/Assistance - 24 hour; or  SNF(pending family support at home for 24/7)     Equipment Recommendations  None recommended by PT    Recommendations for Other Services       Precautions / Restrictions Precautions Precautions: Fall Precaution Comments: oxygen dependent Restrictions Weight Bearing Restrictions: No    Mobility  Bed Mobility               General bed mobility comments: OOB in recliner   Transfers Overall transfer level: Needs assistance Equipment used: Rolling walker (2 wheeled) Transfers: Sit to/from [facility] Sit to Stand: Min assist Stand pivot transfers: Min guard       General transfer comment: 25% VC's on safety with turns also assisted with toilet transfer   Ambulation/Gait Ambulation/Gait assistance: Min assist Gait Distance (Feet): 24 Feet(12 feet x 2 to and from bathroom) Assistive device: Rolling walker (2 wheeled) Gait Pattern/deviations: Step-through pattern;Decreased stride length;Trunk flexed Gait velocity: decreased     General Gait Details: very unsteady gait with limited distance due to dyspnea.  Remained on 4 lts nasal sats lowest 87% during gait.  Poor forward flex posture and shuffled gait.   Stairs             Wheelchair Mobility    Modified Rankin (Stroke Patients Only)       Balance                                            Cognition Arousal/Alertness: Awake/alert Behavior During Therapy: WFL for tasks assessed/performed Overall Cognitive Status: Within Functional Limits for tasks assessed                                 General Comments: cooperative but self limiting      Exercises      General Comments        Pertinent Vitals/Pain Pain Assessment: No/denies pain    Home Living                      Prior Function            PT Goals (current goals can now be found in the care plan section)      Frequency    Min 3X/week      PT Plan Current plan remains appropriate    Co-evaluation  AM-PAC PT "6 Clicks" Mobility   Outcome Measure  Help needed turning from your back to your side while in a flat bed without using bedrails?: A Little Help needed moving from lying on your back to sitting on the side of a flat bed without using bedrails?: A Little Help needed moving to and from a bed to a chair (including a wheelchair)?: A Little Help needed standing up from a chair using your arms (e.g., wheelchair or bedside chair)?: A Little Help needed to walk in hospital room?: A Little Help needed climbing 3-5 steps with a railing? : A Little 6 Click Score: 18    End of Session Equipment Utilized During Treatment: Gait belt;Oxygen Activity Tolerance: Patient limited by fatigue Patient left: in chair;with call bell/phone within reach   PT Visit Diagnosis: Unsteadiness on feet (R26.81);Other abnormalities of gait and mobility (R26.89);Other symptoms and signs involving the nervous system (R29.898)      Time: 5146-0479 PT Time Calculation (min) (ACUTE ONLY): 27 min  Charges:  $Gait Training: 8-22 mins $Therapeutic Activity: 8-22 mins                     {[name]  PTA Acute  Rehabilitation Services Pager      [phone] Office      [phone]

## 2018-06-26 NOTE — Care Management Important Message (Signed)
Important Message  Patient Details  Name: [name] MRN: [id number] Date of Birth: 1932-01-01   Medicare Important Message Given:  Yes    Cruze Zingaro 06/26/2018, 9:14 AM

## 2018-06-26 NOTE — Significant Event (Signed)
Rapid Response Event Note  Overview: Time Called: [place] Time: 1615 Event Type: Respiratory(Hypercarbia ) Respiratory Therapist, Megan, called due to abnormal CO2 on ABG and patient being more drowsy/lethargic. Upon arrival to room, patient was receiving breathing treatment and maintaining O2 Sats in the 90s on NRB. MD aware of elevated CO2 on ABG, will transfer to ICU/SD for BIPAP.   Initial Focused Assessment: Neuro: Lethargic, but oriented x3, able to follow simple commands, able to move all extremities with ease. Cardiac: NSR, PVCs occassional, S1 and S2 heart sounds heard upon auscultation. BP 122/92 (102) HR 85  Pulmonary: Breath sounds clear and diminished throughout all lung fields. Patient on NRB, O2 Sats maintaining in mid 90s. Patient needing BiPAP due to Hypercarbia. RR 20s-30s.   Interventions: Immediate Transfer to ICU for BIPAP.   Plan of Care (if not transferred):     [name] C

## 2018-06-26 NOTE — Progress Notes (Signed)
Call from Respiratory therapist who is with patient in CT scan room to report patient's O2 saturation dropped into the 60s. She added a non-rebreather mask and patient is now sating into the 90s. Dr Marthenia Rolling notified and is present on unit. He will wait for her to return to unit and assess. Donne Hazel, RN

## 2018-06-26 NOTE — Progress Notes (Signed)
Pt requesting to come off BIPAP due to discomfort.  Pt placed on 3 LPM [place] and tolerating well at this time, RT to monitor and assess as needed.

## 2018-06-26 NOTE — Progress Notes (Signed)
PROGRESS NOTE    [name]  VPX:[id number] DOB: 06/07/1932 DOA: 06/18/2018 PCP: Patient, No Pcp Per  Outpatient Specialists:   Brief Narrative:  Patient was admitted with COPD exacerbation.  [name] is an 85 year old African-American female with past medical history significant for chronic COPD O2 dependent on 3 L/min nasal cannula at home, diastolic congestive heart failure hypertension, hyperlipidemia and GERD.  Patient was admitted due to dyspnea despite her nebulizer and oxygen at home.  Despite aggressive treatment, patient has not made any significant improvement.  Pulmonary team was consulted, patient was seen by Dr. Ayesha Rumpf (06/25/2018).  Patient underwent CT Chest earlier today (06/26/2018) that revealed "Image quality is degraded by respiratory motion and expiratory phase imaging as well as patient positioning. Favor scarring in the right upper and right lower lobes. No definite acute findings.  Cholelithiasis.  Aortic atherosclerosis (ICD10-170.0). Coronary artery calcification.  Enlarged pulmonary arteries, indicative of pulmonary arterial Hypertension.  Emphysema.  While at the CT table, patient developed significant respiratory distress, with O2 sat in the 60s and was placed on nonrebreather.  On arrival to the floor, patient was transferred to ICU.  Guarded prognosis.  Continue diuresis.  Increase the dose of nebs DuoNeb to 0.5 twice daily.  Further management will depend on hospital course.  Assessment & Plan:   Principal Problem:   COPD exacerbation ([place]) Active Problems:   Dyslipidemia   Essential hypertension   COPD (chronic obstructive pulmonary disease) (HCC)   SVT (supraventricular tachycardia) (HCC)   GERD (gastroesophageal reflux disease)   Chronic respiratory failure (HCC)   Chronic diastolic heart failure (HCC)   Acute on chronic respiratory failure with hypoxia and hypercapnia (HCC)  Acute on chronic respiratory failure/COPD with acute exacerbation with  respiratory distress during CT scan: Transfer patient to ICU.   ABG result is noted. Trend troponin Continue IV Solu-Medrol Continue nebs DuoNeb 4 times daily.   Increase the dose of Pulmicort to 0.5 twice daily  Continue IV steroids  Continue flutter valve device.  Continue antibiotics Continue supplemental oxygen.  Guarded prognosis.   Further management will depend on hospital course.   Hypertension: This is optimized.  Diastolic congestive heart failure: IV Lasix 40 mg x 1 dose, reviewed.   DVT prophylaxis: Subcut Lovenox Code Status: Partial code Family Communication:  Disposition Plan: Will depend on hospital course   Consultants:   None  Procedures:   None  Antimicrobials:   Doxycycline   Subjective: Patient continues to report not feeling well Patient continues to report a respiratory cultures.  Objective: Vitals:   06/26/18 1504 06/26/18 1507 06/26/18 1600 06/26/18 1612  BP:  135/63 (!) 142/84   Pulse:  61 74 73  Resp: (!) 28  19 (!) 34  Temp:      TempSrc:      SpO2: 95% 98% 98% 95%    Intake/Output Summary (Last 24 hours) at 06/26/2018 1638 Last data filed at 06/26/2018 1400 Gross per 24 hour  Intake 960 ml  Output 500 ml  Net 460 ml   There were no vitals filed for this visit.  Examination:  General exam: Appears calm and comfortable.  Morbidly obese. Respiratory system: Very decreased air entry globally. Cardiovascular system: S1 & S2  Gastrointestinal system: Morbidly obese, soft and nontender.  Organs are difficult to assess. Central nervous system: Awake and alert.  Patient moves all limbs.  Data Reviewed: I have personally reviewed following labs and imaging studies  CBC: Recent Labs  Lab 06/25/18 0431  WBC 6.3  HGB 14.0  HCT 46.7*  MCV 103.5*  PLT 400   Basic Metabolic Panel: Recent Labs  Lab 06/21/18 0410 06/25/18 0431  NA 143  --   K 4.4  --   CL 95*  --   CO2 39*  --   GLUCOSE 137*  --   BUN 21  --     CREATININE 0.66 0.87  CALCIUM 8.8*  --    GFR: CrCl cannot be calculated (Unknown ideal weight.). Liver Function Tests: Recent Labs  Lab 06/21/18 0410  AST 38  ALT 82*  ALKPHOS 48  BILITOT 0.6  PROT 6.9  ALBUMIN 3.5   No results for input(s): LIPASE, AMYLASE in the last 168 hours. No results for input(s): AMMONIA in the last 168 hours. Coagulation Profile: No results for input(s): INR, PROTIME in the last 168 hours. Cardiac Enzymes: No results for input(s): CKTOTAL, CKMB, CKMBINDEX, TROPONINI in the last 168 hours. BNP (last 3 results) No results for input(s): PROBNP in the last 8760 hours. HbA1C: No results for input(s): HGBA1C in the last 72 hours. CBG: No results for input(s): GLUCAP in the last 168 hours. Lipid Profile: No results for input(s): CHOL, HDL, LDLCALC, TRIG, CHOLHDL, LDLDIRECT in the last 72 hours. Thyroid Function Tests: No results for input(s): TSH, T4TOTAL, FREET4, T3FREE, THYROIDAB in the last 72 hours. Anemia Panel: No results for input(s): VITAMINB12, FOLATE, FERRITIN, TIBC, IRON, RETICCTPCT in the last 72 hours. Urine analysis:    Component Value Date/Time   COLORURINE YELLOW 11/14/2014 2021   APPEARANCEUR CLEAR 11/14/2014 2021   LABSPEC 1.004 (L) 11/14/2014 2021   PHURINE 5.5 11/14/2014 2021   GLUCOSEU NEGATIVE 11/14/2014 2021   HGBUR NEGATIVE 11/14/2014 2021   [place] NEGATIVE 11/14/2014 2021   KETONESUR NEGATIVE 11/14/2014 2021   PROTEINUR NEGATIVE 11/14/2014 2021   UROBILINOGEN 1.0 11/14/2014 2021   NITRITE NEGATIVE 11/14/2014 2021   LEUKOCYTESUR NEGATIVE 11/14/2014 2021   Sepsis Labs: @LABRCNTIP (procalcitonin:4,lacticidven:4)  )No results found for this or any previous visit (from the past 240 hour(s)).       Radiology Studies: Dg Chest 2 View  Result Date: 06/26/2018 CLINICAL DATA:  85 year old female with hypertension, asthma and COPD EXAM: CHEST - 2 VIEW COMPARISON:  Prior chest x-ray yesterday, 06/25/2018 FINDINGS:  There has been no significant interval change in the appearance of the chest. Persistent patchy airspace opacity in the right lung base obscuring the right heart border and the hemidiaphragm. Similar findings have been seen on prior imaging. On the lateral view, there may be some minimal patchy airspace opacity in the right lower and middle lobes. No definitive atelectasis. Overall, the lung volumes are hyperinflated and there is air chronic bronchitic changes and mild interstitial prominence. Stable enlargement of the cardiac and mediastinal contours. The patient is somewhat rotated toward the right. No pneumothorax or pleural effusion. IMPRESSION: 1. Mild patchy right lower and middle lobe airspace opacities concerning for pneumonia. 2. Otherwise, stable chronic background pulmonary parenchymal changes and cardiomegaly. Electronically Signed   By: Jacqulynn Cadet M.D.   On: 06/26/2018 09:55   Ct Chest W Contrast  Result Date: 06/26/2018 CLINICAL DATA:  Productive cough, shortness of breath. EXAM: CT CHEST WITH CONTRAST TECHNIQUE: Multidetector CT imaging of the chest was performed during intravenous contrast administration. CONTRAST:  23mL OMNIPAQUE IOHEXOL 300 MG/ML  SOLN COMPARISON:  12/04/2005. FINDINGS: Cardiovascular: Atherosclerotic calcification of the aorta and coronary arteries. Pulmonary arteries and heart are enlarged. No pericardial effusion. Mediastinum/Nodes: No pathologically enlarged mediastinal lymph  nodes. 10 mm right hilar lymph node. No axillary adenopathy. Esophagus is grossly unremarkable. Lungs/Pleura: Image quality is degraded by respiratory motion and expiratory phase imaging. Centrilobular emphysema. Scarring in the posterior segment right upper lobe. Additional scarring in the right lower lobe. Left lung is clear. No pleural fluid. Airway is unremarkable. Upper Abdomen: Visualized portion of the liver is unremarkable. Numerous calcified stones in the gallbladder. Visualized  portions of the adrenal glands, kidneys, spleen, pancreas, stomach and bowel are grossly unremarkable. No upper abdominal adenopathy. Musculoskeletal: Degenerative changes in the spine. Question posttraumatic changes in the left ribs. IMPRESSION: 1. Image quality is degraded by respiratory motion and expiratory phase imaging as well as patient positioning. Favor scarring in the right upper and right lower lobes. No definite acute findings. 2. Cholelithiasis. 3. Aortic atherosclerosis (ICD10-170.0). Coronary artery calcification. 4. Enlarged pulmonary arteries, indicative of pulmonary arterial hypertension. 5.  Emphysema (ICD10-J43.9). Electronically Signed   By: Lorin Picket M.D.   On: 06/26/2018 15:45   Dg Chest Port 1 View  Result Date: 06/25/2018 CLINICAL DATA:  Cough and congestion. EXAM: PORTABLE CHEST 1 VIEW COMPARISON:  06/18/2018. FINDINGS: Patient rotated right mediastinum is normal. Right lower lobe atelectasis/consolidation. Close follow-up exams to demonstrate clearing suggested in order to exclude underlying mass lesion. Stable chronic interstitial prominence. Small right pleural effusion. No pneumothorax. Stable deformity left anterior fourth rib. Degenerative changes both shoulders. IMPRESSION: 1. Right lower lobe atelectasis/consolidation. Close follow-up exams to demonstrate clearing suggested in order to exclude underlying mass lesion. 2.  Stable chronic interstitial prominence. Electronically Signed   By: Marcello Moores  Register   On: 06/25/2018 10:34        Scheduled Meds: . budesonide (PULMICORT) nebulizer solution  0.5 mg Nebulization BID  . dextromethorphan-guaiFENesin  2 tablet Oral BID  . diltiazem  60 mg Oral BID  . docusate sodium  100 mg Oral BID  . doxycycline  100 mg Oral Q12H  . enoxaparin (LOVENOX) injection  40 mg Subcutaneous Q24H  . feeding supplement (ENSURE ENLIVE)  237 mL Oral BID BM  . furosemide  40 mg Intravenous Once  . ipratropium-albuterol  3 mL  Nebulization Q6H WA  . methylPREDNISolone (SOLU-MEDROL) injection  80 mg Intravenous Q12H  . metoprolol tartrate  12.5 mg Oral BID  . multivitamin with minerals  1 tablet Oral Daily  . pantoprazole  40 mg Oral BID AC  . sodium chloride (PF)       Continuous Infusions:   LOS: 6 days    Time spent: 35 minutes    Dana Allan, MD  [facility] Pager #: [phone] 7PM-7AM contact night coverage as above

## 2018-06-26 NOTE — Progress Notes (Signed)
Patient returns from [place] with Non-breather. Sat's 96-97%. Dr. Marthenia Rolling and respiratory Megan at bedside. Blood gases drawn. Neb treatment started by respiratory. Patient to be transferred to tele bed

## 2018-06-26 NOTE — Progress Notes (Signed)
PULMONARY / CRITICAL CARE MEDICINE   NAME:  [name], MRN:  [id number], DOB:  August 27, 1931, LOS: 6 ADMISSION DATE:  06/18/2018, CONSULTATION DATE:  06/25/18 REFERRING MD:  [facility], CHIEF COMPLAINT:  sob  BRIEF HISTORY:    32 yobf  h/o copd/ 02 dep 3lpm with sob baseline = MMRC3 = can't walk 100 yards even at a slow pace at a flat grade s stopping due to sob  Acutely worse x sev weeks pta with min cough, some wheeze and no better since admit and rx as aecopd so pccm service asked to see pt 06/25/18  PFT's  08/14/15  FEV1 0.68 (42 % ) ratio 51  p 9 % improvement from saba   with DLCO  25 % corrects to 47 % for alv volume  With classic curvature c/w GOLD III criteria              CULTURES:  None as of 06/25/18  ANTIBIOTICS:  Doxy 1/9 >>>    SUBJECTIVE:  No complaints at rest but spending most of her time in bed   CONSTITUTIONAL: BP (!) 115/53 (BP Location: Left Arm)   Pulse 71   Temp 98.6 F (37 C) (Oral)   Resp 16   SpO2 98%   I/O last 3 completed shifts: In: 1620 [P.O.:1620] Out: 1800 [Urine:1800]        PHYSICAL EXAM: General:   ederly bf nad at rest on 5lpm NP  Neuro:  slt sluggish responses, no apparent motor def HEENT:  Edentulous, nl orophx Cardiovascular:  RRR distant s1s2 Lungs:  Decrease in bases s egophony or bronchial changes Abdomen:  Obese/ soft/ with limited insp excursion Musculoskeletal:  Warm s C C or E Skin:no rash, no breakdown     CXR PA and Lateral:   06/18/2018 :    I personally reviewed images and agree with radiology impression as follows:   1. Mild patchy right lower and middle lobe airspace opacities concerning for pneumonia. 2. Otherwise, stable chronic background pulmonary parenchymal changes and cardiomegaly. My impression:  Marked chronic vol loss on R, worse now    RESOLVED PROBLEM LIST   ASSESSMENT AND PLAN   1) severe copd with aecopd ? Etiology - continue nebs/ steroids/ doxy for now   2) acute on chronic hypoxemic and  hypercarbic RF - HCO3 = 39 06/21/18 c/w chronic hypercarbic component  Well compensated at present but requiring up to 5lpm for sats   3) Abnormal cxr ? atx from mucus plugs or tumor - rx with mucinex/ flutter valve -- need CT chest with contrast to eval RLL vol loss    Christinia Gully, MD Pulmonary and [place] [facility] [phone] After 5:30 PM or weekends, use Beeper [phone]

## 2018-06-27 LAB — CBC WITH DIFFERENTIAL/PLATELET
Abs Immature Granulocytes: 0.09 10*3/uL — ABNORMAL HIGH (ref 0.00–0.07)
Basophils Absolute: 0 10*3/uL (ref 0.0–0.1)
Basophils Relative: 0 %
Eosinophils Absolute: 0 10*3/uL (ref 0.0–0.5)
Eosinophils Relative: 0 %
HCT: 46.2 % — ABNORMAL HIGH (ref 36.0–46.0)
Hemoglobin: 13.8 g/dL (ref 12.0–15.0)
Immature Granulocytes: 1 %
Lymphocytes Relative: 6 %
Lymphs Abs: 0.5 10*3/uL — ABNORMAL LOW (ref 0.7–4.0)
MCH: 31.4 pg (ref 26.0–34.0)
MCHC: 29.9 g/dL — ABNORMAL LOW (ref 30.0–36.0)
MCV: 105.2 fL — ABNORMAL HIGH (ref 80.0–100.0)
Monocytes Absolute: 0.6 10*3/uL (ref 0.1–1.0)
Monocytes Relative: 6 %
Neutro Abs: 7.6 10*3/uL (ref 1.7–7.7)
Neutrophils Relative %: 87 %
Platelets: 185 10*3/uL (ref 150–400)
RBC: 4.39 MIL/uL (ref 3.87–5.11)
RDW: 12.8 % (ref 11.5–15.5)
WBC: 8.7 10*3/uL (ref 4.0–10.5)
nRBC: 0 % (ref 0.0–0.2)

## 2018-06-27 LAB — RENAL FUNCTION PANEL
Albumin: 3.1 g/dL — ABNORMAL LOW (ref 3.5–5.0)
Anion gap: 8 (ref 5–15)
BUN: 31 mg/dL — ABNORMAL HIGH (ref 8–23)
CO2: 46 mmol/L — ABNORMAL HIGH (ref 22–32)
Calcium: 8.1 mg/dL — ABNORMAL LOW (ref 8.9–10.3)
Chloride: 89 mmol/L — ABNORMAL LOW (ref 98–111)
Creatinine, Ser: 0.78 mg/dL (ref 0.44–1.00)
GFR calc Af Amer: >60 mL/min (ref >60)
GFR calc non Af Amer: >60 mL/min (ref >60)
Glucose, Bld: 212 mg/dL — ABNORMAL HIGH (ref 70–99)
Phosphorus: 4.5 mg/dL (ref 2.5–4.6)
Potassium: 4 mmol/L (ref 3.5–5.1)
Sodium: 143 mmol/L (ref 135–145)

## 2018-06-27 LAB — BRAIN NATRIURETIC PEPTIDE: B Natriuretic Peptide: 90.2 pg/mL (ref 0.0–100.0)

## 2018-06-27 LAB — MAGNESIUM: Magnesium: 2.2 mg/dL (ref 1.7–2.4)

## 2018-06-27 LAB — GLUCOSE, CAPILLARY: Glucose-Capillary: 278 mg/dL — ABNORMAL HIGH (ref 70–99)

## 2018-06-27 MED ORDER — FUROSEMIDE 10 MG/ML IJ SOLN
40.0000 mg | Freq: Once | INTRAMUSCULAR | Status: AC
  Administered 2018-06-27: 40 mg via INTRAVENOUS
  Filled 2018-06-27: qty 4
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
## 2018-06-27 NOTE — Progress Notes (Signed)
Called report to [employer] [facility] on [employer], will transfer pt in bed on telemetry with 3 liters O2

## 2018-06-27 NOTE — Progress Notes (Signed)
PROGRESS NOTE    [name]  RXV:[id number] DOB: September 22, 1931 DOA: 06/18/2018 PCP: Patient, No Pcp Per  Outpatient Specialists:   Brief Narrative:  Patient was admitted with COPD exacerbation.  [name] is an 85 year old African-American female with past medical history significant for chronic COPD O2 dependent on 3 L/min nasal cannula at home, diastolic congestive heart failure hypertension, hyperlipidemia and GERD.  Patient was admitted due to dyspnea despite her nebulizer and oxygen at home.  Despite aggressive treatment, patient has not made any significant improvement.  Pulmonary team was consulted, patient was seen by Dr. Ayesha Jacobs (06/25/2018).  Patient underwent CT Chest earlier today (06/26/2018) that revealed "Image quality is degraded by respiratory motion and expiratory phase imaging as well as patient positioning. Favor scarring in the right upper and right lower lobes. No definite acute findings.  Cholelithiasis.  Aortic atherosclerosis (ICD10-170.0). Coronary artery calcification.  Enlarged pulmonary arteries, indicative of pulmonary arterial Hypertension.  Emphysema.  While at the CT table, patient developed significant respiratory distress, with O2 sat in the 60s and was placed on nonrebreather.  On arrival to the floor, patient was transferred to ICU.  Guarded prognosis.  Continue diuresis.  Increase the dose of nebs DuoNeb to 0.5 twice daily.  Further management will depend on hospital course.  06/27/2018: Patient seen.  Patient looks much better today.  Discussed with the patient's nurse.  Apparently, patient was only on BiPAP for a short while yesterday.  Patient is currently on 3 L of supplemental oxygen.  Patient looks much better today.  Will administer 1 more dose of IV Lasix 40 mg.  Will transfer patient to telemetry floor.  Assessment & Plan:   Principal Problem:   COPD exacerbation ([place]) Active Problems:   Dyslipidemia   Essential hypertension   COPD (chronic  obstructive pulmonary disease) (HCC)   SVT (supraventricular tachycardia) (HCC)   GERD (gastroesophageal reflux disease)   Chronic respiratory failure (HCC)   Chronic diastolic heart failure (HCC)   Acute on chronic respiratory failure with hypoxia and hypercapnia (HCC)  Acute on chronic respiratory failure/COPD with acute exacerbation with respiratory distress during CT scan: Transfer patient to ICU.   ABG result is noted. Trend troponin Continue IV Solu-Medrol Continue nebs DuoNeb 4 times daily.   Increase the dose of Pulmicort to 0.5 twice daily  Continue IV steroids  Continue flutter valve device.  Continue antibiotics Continue supplemental oxygen.  Guarded prognosis.   Further management will depend on hospital course.  06/27/2018: Patient is better today.  Continue current management.  We will continue gentle diuresis.  Hypertension: This is optimized.  Diastolic congestive heart failure: Repeat IV Lasix 40 mg x 1 dose today.   DVT prophylaxis: Subcut Lovenox Code Status: Partial code Family Communication:  Disposition Plan: Will depend on hospital course   Consultants:   None  Procedures:   None  Antimicrobials:   Doxycycline   Subjective: Patient looks much better today.  Objective: Vitals:   06/27/18 0500 06/27/18 0606 06/27/18 0700 06/27/18 0730  BP: (!) 115/52 122/83 (!) 94/47   Pulse: (!) 58 64 (!) 56   Resp: 19 (!) 26 15   Temp:    98 F (36.7 C)  TempSrc:    Axillary  SpO2: 91% 99% (!) 89%   Weight:      Height:        Intake/Output Summary (Last 24 hours) at 06/27/2018 0920 Last data filed at 06/27/2018 0700 Gross per 24 hour  Intake 600 ml  Output 850 ml  Net -250 ml   Filed Weights   06/26/18 1641  Weight: 77 kg    Examination:  General exam: Appears calm and comfortable.  Morbidly obese. Respiratory system: Decreased air entry globally.   Cardiovascular system: S1 & S2.  No significant leg edema. Gastrointestinal  system: Morbidly obese, soft and nontender.  Organs are difficult to assess. Central nervous system: Awake and alert.  Patient moves all limbs.  Data Reviewed: I have personally reviewed following labs and imaging studies  CBC: Recent Labs  Lab 06/25/18 0431  WBC 6.3  HGB 14.0  HCT 46.7*  MCV 103.5*  PLT 258   Basic Metabolic Panel: Recent Labs  Lab 06/21/18 0410 06/25/18 0431  NA 143  --   K 4.4  --   CL 95*  --   CO2 39*  --   GLUCOSE 137*  --   BUN 21  --   CREATININE 0.66 0.87  CALCIUM 8.8*  --    GFR: Estimated Creatinine Clearance: 49.4 mL/min (by C-G formula based on SCr of 0.87 mg/dL). Liver Function Tests: Recent Labs  Lab 06/21/18 0410  AST 38  ALT 82*  ALKPHOS 48  BILITOT 0.6  PROT 6.9  ALBUMIN 3.5   No results for input(s): LIPASE, AMYLASE in the last 168 hours. No results for input(s): AMMONIA in the last 168 hours. Coagulation Profile: No results for input(s): INR, PROTIME in the last 168 hours. Cardiac Enzymes: Recent Labs  Lab 06/26/18 1605 06/26/18 1935  TROPONINI <0.03 <0.03   BNP (last 3 results) No results for input(s): PROBNP in the last 8760 hours. HbA1C: No results for input(s): HGBA1C in the last 72 hours. CBG: No results for input(s): GLUCAP in the last 168 hours. Lipid Profile: No results for input(s): CHOL, HDL, LDLCALC, TRIG, CHOLHDL, LDLDIRECT in the last 72 hours. Thyroid Function Tests: No results for input(s): TSH, T4TOTAL, FREET4, T3FREE, THYROIDAB in the last 72 hours. Anemia Panel: No results for input(s): VITAMINB12, FOLATE, FERRITIN, TIBC, IRON, RETICCTPCT in the last 72 hours. Urine analysis:    Component Value Date/Time   COLORURINE YELLOW 11/14/2014 2021   APPEARANCEUR CLEAR 11/14/2014 2021   LABSPEC 1.004 (L) 11/14/2014 2021   PHURINE 5.5 11/14/2014 2021   GLUCOSEU NEGATIVE 11/14/2014 2021   HGBUR NEGATIVE 11/14/2014 2021   [place] NEGATIVE 11/14/2014 2021   KETONESUR NEGATIVE 11/14/2014 2021    PROTEINUR NEGATIVE 11/14/2014 2021   UROBILINOGEN 1.0 11/14/2014 2021   NITRITE NEGATIVE 11/14/2014 2021   LEUKOCYTESUR NEGATIVE 11/14/2014 2021   Sepsis Labs: @LABRCNTIP (procalcitonin:4,lacticidven:4)  )No results found for this or any previous visit (from the past 240 hour(s)).       Radiology Studies: Dg Chest 2 View  Result Date: 06/26/2018 CLINICAL DATA:  85 year old female with hypertension, asthma and COPD EXAM: CHEST - 2 VIEW COMPARISON:  Prior chest x-ray yesterday, 06/25/2018 FINDINGS: There has been no significant interval change in the appearance of the chest. Persistent patchy airspace opacity in the right lung base obscuring the right heart border and the hemidiaphragm. Similar findings have been seen on prior imaging. On the lateral view, there may be some minimal patchy airspace opacity in the right lower and middle lobes. No definitive atelectasis. Overall, the lung volumes are hyperinflated and there is air chronic bronchitic changes and mild interstitial prominence. Stable enlargement of the cardiac and mediastinal contours. The patient is somewhat rotated toward the right. No pneumothorax or pleural effusion. IMPRESSION: 1. Mild patchy right lower and middle  lobe airspace opacities concerning for pneumonia. 2. Otherwise, stable chronic background pulmonary parenchymal changes and cardiomegaly. Electronically Signed   By: Jacqulynn Cadet M.D.   On: 06/26/2018 09:55   Ct Chest W Contrast  Result Date: 06/26/2018 CLINICAL DATA:  Productive cough, shortness of breath. EXAM: CT CHEST WITH CONTRAST TECHNIQUE: Multidetector CT imaging of the chest was performed during intravenous contrast administration. CONTRAST:  80mL OMNIPAQUE IOHEXOL 300 MG/ML  SOLN COMPARISON:  12/04/2005. FINDINGS: Cardiovascular: Atherosclerotic calcification of the aorta and coronary arteries. Pulmonary arteries and heart are enlarged. No pericardial effusion. Mediastinum/Nodes: No pathologically enlarged  mediastinal lymph nodes. 10 mm right hilar lymph node. No axillary adenopathy. Esophagus is grossly unremarkable. Lungs/Pleura: Image quality is degraded by respiratory motion and expiratory phase imaging. Centrilobular emphysema. Scarring in the posterior segment right upper lobe. Additional scarring in the right lower lobe. Left lung is clear. No pleural fluid. Airway is unremarkable. Upper Abdomen: Visualized portion of the liver is unremarkable. Numerous calcified stones in the gallbladder. Visualized portions of the adrenal glands, kidneys, spleen, pancreas, stomach and bowel are grossly unremarkable. No upper abdominal adenopathy. Musculoskeletal: Degenerative changes in the spine. Question posttraumatic changes in the left ribs. IMPRESSION: 1. Image quality is degraded by respiratory motion and expiratory phase imaging as well as patient positioning. Favor scarring in the right upper and right lower lobes. No definite acute findings. 2. Cholelithiasis. 3. Aortic atherosclerosis (ICD10-170.0). Coronary artery calcification. 4. Enlarged pulmonary arteries, indicative of pulmonary arterial hypertension. 5.  Emphysema (ICD10-J43.9). Electronically Signed   By: Lorin Picket M.D.   On: 06/26/2018 15:45   Dg Chest Port 1 View  Result Date: 06/25/2018 CLINICAL DATA:  Cough and congestion. EXAM: PORTABLE CHEST 1 VIEW COMPARISON:  06/18/2018. FINDINGS: Patient rotated right mediastinum is normal. Right lower lobe atelectasis/consolidation. Close follow-up exams to demonstrate clearing suggested in order to exclude underlying mass lesion. Stable chronic interstitial prominence. Small right pleural effusion. No pneumothorax. Stable deformity left anterior fourth rib. Degenerative changes both shoulders. IMPRESSION: 1. Right lower lobe atelectasis/consolidation. Close follow-up exams to demonstrate clearing suggested in order to exclude underlying mass lesion. 2.  Stable chronic interstitial prominence.  Electronically Signed   By: Marcello Moores  Register   On: 06/25/2018 10:34        Scheduled Meds: . budesonide (PULMICORT) nebulizer solution  0.5 mg Nebulization BID  . dextromethorphan-guaiFENesin  2 tablet Oral BID  . diltiazem  60 mg Oral BID  . docusate sodium  100 mg Oral BID  . doxycycline  100 mg Oral Q12H  . enoxaparin (LOVENOX) injection  40 mg Subcutaneous Q24H  . feeding supplement (ENSURE ENLIVE)  237 mL Oral BID BM  . furosemide  40 mg Intravenous Once  . ipratropium-albuterol  3 mL Nebulization Q6H WA  . methylPREDNISolone (SOLU-MEDROL) injection  80 mg Intravenous Q12H  . metoprolol tartrate  12.5 mg Oral BID  . multivitamin with minerals  1 tablet Oral Daily  . pantoprazole  40 mg Oral BID AC   Continuous Infusions:   LOS: 7 days    Time spent: 35 minutes    Dana Allan, MD  [facility] Pager #: [phone] 7PM-7AM contact night coverage as above

## 2018-06-27 NOTE — Progress Notes (Signed)
Bipap not warranted, nor does patient want to wear any device

## 2018-06-28 DIAGNOSIS — J9612 Chronic respiratory failure with hypercapnia: Secondary | ICD-10-CM

## 2018-06-28 DIAGNOSIS — J9611 Chronic respiratory failure with hypoxia: Secondary | ICD-10-CM

## 2018-06-28 DIAGNOSIS — J431 Panlobular emphysema: Secondary | ICD-10-CM

## 2018-06-28 DIAGNOSIS — I10 Essential (primary) hypertension: Secondary | ICD-10-CM

## 2018-06-28 DIAGNOSIS — E785 Hyperlipidemia, unspecified: Secondary | ICD-10-CM

## 2018-06-28 DIAGNOSIS — I5032 Chronic diastolic (congestive) heart failure: Secondary | ICD-10-CM

## 2018-06-28 LAB — RENAL FUNCTION PANEL
Albumin: 3 g/dL — ABNORMAL LOW (ref 3.5–5.0)
Anion gap: 8 (ref 5–15)
BUN: 34 mg/dL — ABNORMAL HIGH (ref 8–23)
CO2: 46 mmol/L — ABNORMAL HIGH (ref 22–32)
Calcium: 8.2 mg/dL — ABNORMAL LOW (ref 8.9–10.3)
Chloride: 90 mmol/L — ABNORMAL LOW (ref 98–111)
Creatinine, Ser: 0.72 mg/dL (ref 0.44–1.00)
GFR calc Af Amer: >60 mL/min (ref >60)
GFR calc non Af Amer: >60 mL/min (ref >60)
Glucose, Bld: 217 mg/dL — ABNORMAL HIGH (ref 70–99)
Phosphorus: 3.3 mg/dL (ref 2.5–4.6)
Potassium: 3.9 mmol/L (ref 3.5–5.1)
Sodium: 144 mmol/L (ref 135–145)

## 2018-06-28 MED ORDER — HYDROCORTISONE 2.5 % RE CREA
TOPICAL_CREAM | Freq: Two times a day (BID) | RECTAL | Status: DC
  Administered 2018-06-29 (×2): via RECTAL
  Administered 2018-06-29: 1 via RECTAL
  Administered 2018-06-30 – 2018-07-02 (×5): via RECTAL
  Filled 2018-06-28: qty 28.35

## 2018-06-28 NOTE — NC FL2 (Signed)
[place] LEVEL OF CARE SCREENING TOOL     IDENTIFICATION  Patient Name: [name] Birthdate: November 25, 1931 Sex: female Admission Date (Current Location): 06/18/2018  [facility] and [place] Number:  Herbalist and Address:  [facility],  [place], [place]      Provider Number: [id number]  Attending Physician Name and Address:  Bonnell Public, MD  Relative Name and Phone Number:  Torra Pala: [phone]    Current Level of Care: Hospital Recommended Level of Care: [place] Prior Approval Number:    Date Approved/Denied:   PASRR Number: [id number]  Discharge Plan: Home    Current Diagnoses: Patient Active Problem List   Diagnosis Date Noted  . Acute on chronic respiratory failure with hypoxia and hypercapnia ([place]) 06/24/2018  . COPD exacerbation ([place]) 06/18/2018  . COPD with acute exacerbation ([place]) 06/18/2018  . Left arm pain 08/09/2017  . 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 Back pain 11/14/2014  . Urinary frequency 11/14/2014  . Chronic diastolic heart failure ([place]) 06/10/2013  . Chronic respiratory failure ([place]) 10/28/2012  . Health care maintenance 04/13/2012  . Preventative health care 08/05/2011  . GERD (gastroesophageal reflux disease) 08/05/2011  . SVT (supraventricular tachycardia) ([place]) 04/01/2011  . Dyslipidemia 06/30/2006  . Essential hypertension 06/30/2006  . COPD (chronic obstructive pulmonary disease) ([place]) 06/30/2006  . OSTEOARTHRITIS 06/30/2006  . OSTEOPENIA 06/30/2006    Orientation RESPIRATION BLADDER Height & Weight     Self, Time, Situation, Place  O2(3L) External catheter Weight: 169 lb 12.1 oz (77 kg) Height:  5\' 5"  (165.1 cm)  BEHAVIORAL SYMPTOMS/MOOD NEUROLOGICAL BOWEL NUTRITION STATUS      Continent Diet(Regular)  AMBULATORY STATUS COMMUNICATION OF NEEDS Skin   Limited Assist Verbally Normal                       Personal Care Assistance Level of  Assistance  Feeding, Dressing, Bathing Bathing Assistance: Limited assistance Feeding assistance: Independent Dressing Assistance: Limited assistance     Functional Limitations Info  Sight, Hearing, Speech Sight Info: Adequate Hearing Info: Adequate Speech Info: Adequate    SPECIAL CARE FACTORS FREQUENCY  PT (By licensed PT), OT (By licensed OT)     PT Frequency: 5x/week OT Frequency: 5x/week            Contractures Contractures Info: Not present    Additional Factors Info  Code Status, Allergies Code Status Info: Partial Allergies Info: NKA           Current Medications (06/28/2018):  This is the current hospital active medication list Current Facility-Administered Medications  Medication Dose Route Frequency Provider Last Rate Last Dose  . acetaminophen (TYLENOL) tablet 650 mg  650 mg Oral Q6H PRN Marcell Anger, MD   650 mg at 06/19/18 1601   Or  . acetaminophen (TYLENOL) suppository 650 mg  650 mg Rectal Q6H PRN Marcell Anger, MD      . albuterol (PROVENTIL) (2.5 MG/3ML) 0.083% nebulizer solution 2.5 mg  2.5 mg Nebulization Q2H PRN Marcell Anger, MD   2.5 mg at 06/26/18 1504  . aspirin EC tablet 81 mg  81 mg Oral Daily PRN Marcell Anger, MD      . bisacodyl (DULCOLAX) suppository 10 mg  10 mg Rectal Daily PRN Roney Jaffe, MD      . budesonide (PULMICORT) nebulizer solution 0.5 mg  0.5 mg Nebulization BID Dana Allan I, MD   0.5 mg at  06/28/18 0755  . dextromethorphan-guaiFENesin (MUCINEX DM) 30-600 MG per 12 hr tablet 2 tablet  2 tablet Oral BID Tanda Rockers, MD   2 tablet at 06/28/18 1030  . diltiazem (CARDIZEM) tablet 60 mg  60 mg Oral BID Marcell Anger, MD   Stopped at 06/28/18 1049  . docusate sodium (COLACE) capsule 100 mg  100 mg Oral BID Roney Jaffe, MD   100 mg at 06/28/18 1030  . enoxaparin (LOVENOX) injection 40 mg  40 mg Subcutaneous Q24H Marcell Anger, MD   40 mg at 06/27/18  2147  . feeding supplement (ENSURE ENLIVE) (ENSURE ENLIVE) liquid 237 mL  237 mL Oral BID BM Cristal Deer, MD   237 mL at 06/28/18 1029  . fluticasone (FLONASE) 50 MCG/ACT nasal spray 1 spray  1 spray Each Nare Daily PRN Spongberg, Audie Pinto, MD      . ipratropium-albuterol (DUONEB) 0.5-2.5 (3) MG/3ML nebulizer solution 3 mL  3 mL Nebulization Q6H WA Roney Jaffe, MD   3 mL at 06/28/18 0750  . methylPREDNISolone sodium succinate (SOLU-MEDROL) 125 mg/2 mL injection 80 mg  80 mg Intravenous Q12H Tanda Rockers, MD   80 mg at 06/28/18 0743  . metoprolol tartrate (LOPRESSOR) tablet 12.5 mg  12.5 mg Oral BID Marcell Anger, MD   12.5 mg at 06/28/18 1050  . multivitamin with minerals tablet 1 tablet  1 tablet Oral Daily Spongberg, Audie Pinto, MD   1 tablet at 06/28/18 1030  . naphazoline-glycerin (CLEAR EYES REDNESS) ophth solution 1 drop  1 drop Both Eyes QID PRN Marcell Anger, MD      . ondansetron (ZOFRAN) tablet 4 mg  4 mg Oral Q6H PRN Spongberg, Audie Pinto, MD       Or  . ondansetron (ZOFRAN) injection 4 mg  4 mg Intravenous Q6H PRN Spongberg, Audie Pinto, MD      . pantoprazole (PROTONIX) EC tablet 40 mg  40 mg Oral BID AC Tanda Rockers, MD   40 mg at 06/28/18 0742  . senna-docusate (Senokot-S) tablet 1 tablet  1 tablet Oral QHS PRN Spongberg, Audie Pinto, MD      . traMADol Veatrice Bourbon) tablet 50 mg  50 mg Oral Q6H PRN Marcell Anger, MD      . traZODone (DESYREL) tablet 25 mg  25 mg Oral QHS PRN Spongberg, Audie Pinto, MD         Discharge Medications: Please see discharge summary for a list of discharge medications.  Relevant Imaging Results:  Relevant Lab Results:   Additional Information SSN: [id number]  Pricilla Holm, [place]

## 2018-06-28 NOTE — Progress Notes (Signed)
PROGRESS NOTE    [name]  AST:[id number] DOB: April 09, 1932 DOA: 06/18/2018 PCP: Patient, No Pcp Per  Outpatient Specialists:   Brief Narrative:  Patient was admitted with COPD exacerbation.  [name] is an 85 year old African-American female with past medical history significant for chronic COPD O2 dependent on 3 L/min nasal cannula at home, diastolic congestive heart failure hypertension, hyperlipidemia and GERD.  Patient was admitted due to dyspnea despite her nebulizer and oxygen at home.  Despite aggressive treatment, patient has not made any significant improvement.  Pulmonary team was consulted, patient was seen by Dr. Ayesha Rumpf (06/25/2018).  Patient underwent CT Chest earlier today (06/26/2018) that revealed "Image quality is degraded by respiratory motion and expiratory phase imaging as well as patient positioning. Favor scarring in the right upper and right lower lobes. No definite acute findings.  Cholelithiasis.  Aortic atherosclerosis (ICD10-170.0). Coronary artery calcification.  Enlarged pulmonary arteries, indicative of pulmonary arterial Hypertension.  Emphysema.  While at the CT table, patient developed significant respiratory distress, with O2 sat in the 60s and was placed on nonrebreather.  On arrival to the floor, patient was transferred to ICU.  Guarded prognosis.  Continue diuresis.  Increase the dose of nebs DuoNeb to 0.5 twice daily.  Further management will depend on hospital course.  06/27/2018: Patient seen.  Patient looks much better today.  Discussed with the patient's nurse.  Apparently, patient was only on BiPAP for a short while yesterday.  Patient is currently on 3 L of supplemental oxygen.  Patient looks much better today.  Will administer 1 more dose of IV Lasix 40 mg.  Will transfer patient to telemetry floor.  06/28/2018: Patient seen alongside patient's nurse and daughter.  Patient continues to improve.  Patient is currently on supplemental oxygen 3 L/min via  nasal cannula.  Physical therapy input is appreciated, for skilled nursing facility placement.  Currently pursue disposition at this point.  Patient will be discharged to skilled nursing facility when a bed is available.  Pulmonary input is appreciated.  Assessment & Plan:   Principal Problem:   COPD exacerbation ([facility]) Active Problems:   Dyslipidemia   Essential hypertension   COPD (chronic obstructive pulmonary disease) (HCC)   SVT (supraventricular tachycardia) (HCC)   GERD (gastroesophageal reflux disease)   Chronic respiratory failure (HCC)   Chronic diastolic heart failure (HCC)   Acute on chronic respiratory failure with hypoxia and hypercapnia (HCC)  Acute on chronic respiratory failure with hypoxia and hypercarbia/COPD with acute exacerbation (with history of respiratory distress during recent CT scan chest): Transfer patient to ICU.   ABG result is noted. Trend troponin Continue IV Solu-Medrol Continue nebs DuoNeb 4 times daily.   Increase the dose of Pulmicort to 0.5 twice daily  Continue IV steroids  Continue flutter valve device.  Continue antibiotics Continue supplemental oxygen.  Guarded prognosis.   Further management will depend on hospital course.  06/27/2018: Patient is better today.  Continue current management.  We will continue gentle diuresis. 06/28/2018: Patient has continued to improve.  Patient is on 3 L of supplemental oxygen via nasal cannula.  Pursue disposition (skilled nursing facility).  Hypertension: This is optimized. Continue to monitor closely.  Diastolic congestive heart failure: Patient received IV Lasix 40 mg on 1/17/202 and 06/27/2018. Monitor for now.   DVT prophylaxis: Subcut Lovenox Code Status: Partial code Family Communication:  Disposition Plan: Will depend on hospital course   Consultants:   None  Procedures:   None  Antimicrobials:   Doxycycline  Subjective: Patient looks much better  today.  Objective: Vitals:   06/28/18 1047 06/28/18 1049 06/28/18 1318 06/28/18 1356  BP: (!) 115/58 (!) 112/44 114/63   Pulse:  74 (!) 58   Resp:  (!) 24 (!) 22   Temp:   (!) 97.5 F (36.4 C)   TempSrc:   Oral   SpO2:   95% 94%  Weight:      Height:        Intake/Output Summary (Last 24 hours) at 06/28/2018 1459 Last data filed at 06/28/2018 1255 Gross per 24 hour  Intake 480 ml  Output 650 ml  Net -170 ml   Filed Weights   06/26/18 1641  Weight: 77 kg    Examination:  General exam: Appears calm and comfortable.  Morbidly obese. Respiratory system: Decreased air entry globally.   Cardiovascular system: S1 & S2.  No significant leg edema. Gastrointestinal system: Morbidly obese, soft and nontender.  Organs are difficult to assess. Central nervous system: Awake and alert.  Patient moves all limbs.  Data Reviewed: I have personally reviewed following labs and imaging studies  CBC: Recent Labs  Lab 06/25/18 0431 06/27/18 0946  WBC 6.3 8.7  NEUTROABS  --  7.6  HGB 14.0 13.8  HCT 46.7* 46.2*  MCV 103.5* 105.2*  PLT 195 193   Basic Metabolic Panel: Recent Labs  Lab 06/25/18 0431 06/27/18 0946 06/28/18 0536  NA  --  143 144  K  --  4.0 3.9  CL  --  89* 90*  CO2  --  46* 46*  GLUCOSE  --  212* 217*  BUN  --  31* 34*  CREATININE 0.87 0.78 0.72  CALCIUM  --  8.1* 8.2*  MG  --  2.2  --   PHOS  --  4.5 3.3   GFR: Estimated Creatinine Clearance: 53.7 mL/min (by C-G formula based on SCr of 0.72 mg/dL). Liver Function Tests: Recent Labs  Lab 06/27/18 0946 06/28/18 0536  ALBUMIN 3.1* 3.0*   No results for input(s): LIPASE, AMYLASE in the last 168 hours. No results for input(s): AMMONIA in the last 168 hours. Coagulation Profile: No results for input(s): INR, PROTIME in the last 168 hours. Cardiac Enzymes: Recent Labs  Lab 06/26/18 1605 06/26/18 1935  TROPONINI <0.03 <0.03   BNP (last 3 results) No results for input(s): PROBNP in the last 8760  hours. HbA1C: No results for input(s): HGBA1C in the last 72 hours. CBG: Recent Labs  Lab 06/27/18 2149  GLUCAP 278*   Lipid Profile: No results for input(s): CHOL, HDL, LDLCALC, TRIG, CHOLHDL, LDLDIRECT in the last 72 hours. Thyroid Function Tests: No results for input(s): TSH, T4TOTAL, FREET4, T3FREE, THYROIDAB in the last 72 hours. Anemia Panel: No results for input(s): VITAMINB12, FOLATE, FERRITIN, TIBC, IRON, RETICCTPCT in the last 72 hours. Urine analysis:    Component Value Date/Time   COLORURINE YELLOW 11/14/2014 2021   APPEARANCEUR CLEAR 11/14/2014 2021   LABSPEC 1.004 (L) 11/14/2014 2021   PHURINE 5.5 11/14/2014 2021   GLUCOSEU NEGATIVE 11/14/2014 2021   HGBUR NEGATIVE 11/14/2014 2021   [place] NEGATIVE 11/14/2014 2021   KETONESUR NEGATIVE 11/14/2014 2021   PROTEINUR NEGATIVE 11/14/2014 2021   UROBILINOGEN 1.0 11/14/2014 2021   NITRITE NEGATIVE 11/14/2014 2021   LEUKOCYTESUR NEGATIVE 11/14/2014 2021   Sepsis Labs: @LABRCNTIP (procalcitonin:4,lacticidven:4)  )No results found for this or any previous visit (from the past 240 hour(s)).       Radiology Studies: Ct Chest W Contrast  Result Date:  06/26/2018 CLINICAL DATA:  Productive cough, shortness of breath. EXAM: CT CHEST WITH CONTRAST TECHNIQUE: Multidetector CT imaging of the chest was performed during intravenous contrast administration. CONTRAST:  2mL OMNIPAQUE IOHEXOL 300 MG/ML  SOLN COMPARISON:  12/04/2005. FINDINGS: Cardiovascular: Atherosclerotic calcification of the aorta and coronary arteries. Pulmonary arteries and heart are enlarged. No pericardial effusion. Mediastinum/Nodes: No pathologically enlarged mediastinal lymph nodes. 10 mm right hilar lymph node. No axillary adenopathy. Esophagus is grossly unremarkable. Lungs/Pleura: Image quality is degraded by respiratory motion and expiratory phase imaging. Centrilobular emphysema. Scarring in the posterior segment right upper lobe. Additional  scarring in the right lower lobe. Left lung is clear. No pleural fluid. Airway is unremarkable. Upper Abdomen: Visualized portion of the liver is unremarkable. Numerous calcified stones in the gallbladder. Visualized portions of the adrenal glands, kidneys, spleen, pancreas, stomach and bowel are grossly unremarkable. No upper abdominal adenopathy. Musculoskeletal: Degenerative changes in the spine. Question posttraumatic changes in the left ribs. IMPRESSION: 1. Image quality is degraded by respiratory motion and expiratory phase imaging as well as patient positioning. Favor scarring in the right upper and right lower lobes. No definite acute findings. 2. Cholelithiasis. 3. Aortic atherosclerosis (ICD10-170.0). Coronary artery calcification. 4. Enlarged pulmonary arteries, indicative of pulmonary arterial hypertension. 5.  Emphysema (ICD10-J43.9). Electronically Signed   By: Lorin Picket M.D.   On: 06/26/2018 15:45        Scheduled Meds: . budesonide (PULMICORT) nebulizer solution  0.5 mg Nebulization BID  . dextromethorphan-guaiFENesin  2 tablet Oral BID  . diltiazem  60 mg Oral BID  . docusate sodium  100 mg Oral BID  . enoxaparin (LOVENOX) injection  40 mg Subcutaneous Q24H  . feeding supplement (ENSURE ENLIVE)  237 mL Oral BID BM  . ipratropium-albuterol  3 mL Nebulization Q6H WA  . methylPREDNISolone (SOLU-MEDROL) injection  80 mg Intravenous Q12H  . metoprolol tartrate  12.5 mg Oral BID  . multivitamin with minerals  1 tablet Oral Daily  . pantoprazole  40 mg Oral BID AC   Continuous Infusions:   LOS: 8 days    Time spent: 25 minutes    Dana Allan, MD  [facility] Pager #: [phone] 7PM-7AM contact night coverage as above
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
## 2018-06-29 LAB — RENAL FUNCTION PANEL
Albumin: 2.9 g/dL — ABNORMAL LOW (ref 3.5–5.0)
Anion gap: 11 (ref 5–15)
BUN: 35 mg/dL — ABNORMAL HIGH (ref 8–23)
CO2: 40 mmol/L — ABNORMAL HIGH (ref 22–32)
Calcium: 8.5 mg/dL — ABNORMAL LOW (ref 8.9–10.3)
Chloride: 93 mmol/L — ABNORMAL LOW (ref 98–111)
Creatinine, Ser: 0.8 mg/dL (ref 0.44–1.00)
GFR calc Af Amer: >60 mL/min (ref >60)
GFR calc non Af Amer: >60 mL/min (ref >60)
Glucose, Bld: 252 mg/dL — ABNORMAL HIGH (ref 70–99)
Phosphorus: 3.6 mg/dL (ref 2.5–4.6)
Potassium: 4.3 mmol/L (ref 3.5–5.1)
Sodium: 144 mmol/L (ref 135–145)

## 2018-06-29 MED ORDER — METOPROLOL TARTRATE 25 MG PO TABS: 12.5000 mg | ORAL_TABLET | Freq: Two times a day (BID) | ORAL | 0 refills | Status: DC

## 2018-06-29 MED ORDER — PANTOPRAZOLE SODIUM 40 MG PO TBEC: 40.0000 mg | DELAYED_RELEASE_TABLET | Freq: Every day | ORAL | 1 refills | Status: AC

## 2018-06-29 MED ORDER — ADULT MULTIVITAMIN W/MINERALS CH: 1.0000 | ORAL_TABLET | Freq: Every day | ORAL | Status: AC

## 2018-06-29 MED ORDER — PREDNISONE 10 MG PO TABS: ORAL_TABLET | ORAL | 0 refills | Status: DC

## 2018-06-29 MED ORDER — DM-GUAIFENESIN ER 30-600 MG PO TB12: 2.0000 | ORAL_TABLET | Freq: Two times a day (BID) | ORAL | Status: AC

## 2018-06-29 MED ORDER — HYDROCORTISONE 2.5 % RE CREA: TOPICAL_CREAM | Freq: Two times a day (BID) | RECTAL | 0 refills | Status: AC

## 2018-06-29 MED ORDER — SENNOSIDES-DOCUSATE SODIUM 8.6-50 MG PO TABS: 1.0000 | ORAL_TABLET | Freq: Every evening | ORAL | Status: AC | PRN

## 2018-06-29 NOTE — Discharge Summary (Addendum)
Physician Discharge Summary  [name]:[id number] DOB: Jan 08, 1932 DOA: 06/18/2018  PCP: Patient, No Pcp Per  Admit date: 06/18/2018 Discharge date: 06/30/18 Admitted From: Home  disposition:  nursing home Recommendations for Outpatient Follow-up:  1. Follow up with PCP in 1-2 weeks 2. Please obtain BMP/CBC in one week  Home Health: None Equipment/Devices: None  Discharge Condition stable CODE STATUSfull code Diet recommendation: Cardiac Brief/Interim Summary: Patient was admitted with COPD exacerbation. [name] is an 85 year old African-American female with past medical history significant for chronic COPD O2 dependent on 3 L/min nasal cannula at home, diastolic congestive heart failure hypertension, hyperlipidemia and GERD. Patient was admitted due to dyspnea despite her nebulizer and oxygen at home.  Despite aggressive treatment, patient has not made any significant improvement.  Pulmonary team was consulted, patient was seen by Dr. Ayesha Rumpf (06/25/2018).  Patient underwent CT Chest earlier today (06/26/2018) that revealed "Image quality is degraded by respiratory motion and expiratory phase imaging as well as patient positioning. Favor scarring in the right upper and right lower lobes. No definite acute findings.  Cholelithiasis.  Aortic atherosclerosis (ICD10-170.0). Coronary artery calcification.  Enlarged pulmonary arteries, indicative of pulmonary arterial Hypertension.  Emphysema.  While at the CT table, patient developed significant respiratory distress, with O2 sat in the 60s and was placed on nonrebreather.  On arrival to the floor, patient was transferred to ICU.  Guarded prognosis.  Continue diuresis.  Increase the dose of nebs DuoNeb to 0.5 twice daily.  Further management will depend on hospital course.  06/30/2018 patient waiting for placement.  She is medically stable.  No new changes overnight.  Discharge Diagnoses:  Principal Problem:   COPD exacerbation  ([place]) Active Problems:   Dyslipidemia   Essential hypertension   COPD (chronic obstructive pulmonary disease) (HCC)   SVT (supraventricular tachycardia) (HCC)   GERD (gastroesophageal reflux disease)   Chronic respiratory failure (HCC)   Chronic diastolic heart failure (HCC)   Acute on chronic respiratory failure with hypoxia and hypercapnia (HCC)  Acute on chronic respiratory failure with hypoxia and hypercarbia/COPD with acute exacerbation (with history of respiratory distress during recent CT scan chest): Patient was admitted to the ICU and was treated with IV Solu-Medrol nebulizer Pulmicort antibiotics oxygen and flutter valve device.  Overall condition slowly began to improve she is now on 3 L of oxygen via nasal cannula.  Hypertension: Continue home medications.  Diastolic congestive heart failure: Stable Patient received IV Lasix 40 mg on 1/17/202 and 06/27/2018.    Estimated body mass index is 28.25 kg/m as calculated from the following:   Height as of this encounter: 5\' 5"  (1.651 m).   Weight as of this encounter: 77 kg.  Discharge Instructions   Allergies as of 06/29/2018   No Known Allergies     Medication List    STOP taking these medications   diltiazem 60 MG tablet Commonly known as:  CARDIZEM   rosuvastatin 20 MG tablet Commonly known as:  CRESTOR     TAKE these medications   AEROCHAMBER MV inhaler Use as instructed   albuterol (2.5 MG/3ML) 0.083% nebulizer solution Commonly known as:  PROVENTIL Take 3 mLs (2.5 mg total) by nebulization every 4 (four) hours as needed. DX J44.9 What changed:  reasons to take this   albuterol 108 (90 Base) MCG/ACT inhaler Commonly known as:  PROAIR HFA INHALE TWO PUFFS BY MOUTH EVERY 6 HOURS AS NEEDED FOR WHEEZING OR SHORTNESS OF BREATH What changed:    how much  to take  how to take this  when to take this  reasons to take this  additional instructions   aspirin EC 81 MG tablet Take 81 mg by mouth daily  as needed for mild pain.   budesonide 0.5 MG/2ML nebulizer solution Commonly known as:  PULMICORT Take 2 mLs (0.5 mg total) by nebulization 2 (two) times daily.   dextromethorphan-guaiFENesin 30-600 MG 12hr tablet Commonly known as:  MUCINEX DM Take 2 tablets by mouth 2 (two) times daily.   doxycycline 100 MG tablet Commonly known as:  VIBRA-TABS Take 1 tablet (100 mg total) by mouth 2 (two) times daily.   fluticasone 50 MCG/ACT nasal spray Commonly known as:  FLONASE Place 1 spray into both nostrils daily as needed for allergies or rhinitis.   hydrocortisone 2.5 % rectal cream Commonly known as:  ANUSOL-HC Place rectally 2 (two) times daily.   ipratropium-albuterol 0.5-2.5 (3) MG/3ML Soln Commonly known as:  DUONEB Take 3 mLs by nebulization 4 (four) times daily.   metoprolol tartrate 25 MG tablet Commonly known as:  LOPRESSOR Take 0.5 tablets (12.5 mg total) by mouth 2 (two) times daily.   MUCINEX FAST-MAX COLD FLU PO Take 20 mLs by mouth 2 (two) times daily as needed (cough and cold symptoms).   MUCINEX MAXIMUM STRENGTH 1200 MG Tb12 Generic drug:  Guaifenesin Take 1,200 mg by mouth 2 (two) times daily as needed (loosen phlegm).   multivitamin with minerals Tabs tablet Take 1 tablet by mouth daily. Start taking on:  June 30, 2018   pantoprazole 40 MG tablet Commonly known as:  PROTONIX Take 1 tablet (40 mg total) by mouth daily.   predniSONE 10 MG tablet Commonly known as:  DELTASONE Take 30 mg for the first 3 days then 20 mg for the next 3 days and then 10 mg daily till done.   senna-docusate 8.6-50 MG tablet Commonly known as:  Senokot-S Take 1 tablet by mouth at bedtime as needed for mild constipation.   tetrahydrozoline 0.05 % ophthalmic solution Place 1 drop into both eyes 2 (two) times daily as needed (dry eyes).       No Known Allergies  Consultations:  PCCM   Procedures/Studies: Dg Chest 2 View  Result Date: 06/26/2018 CLINICAL DATA:   85 year old female with hypertension, asthma and COPD EXAM: CHEST - 2 VIEW COMPARISON:  Prior chest x-ray yesterday, 06/25/2018 FINDINGS: There has been no significant interval change in the appearance of the chest. Persistent patchy airspace opacity in the right lung base obscuring the right heart border and the hemidiaphragm. Similar findings have been seen on prior imaging. On the lateral view, there may be some minimal patchy airspace opacity in the right lower and middle lobes. No definitive atelectasis. Overall, the lung volumes are hyperinflated and there is air chronic bronchitic changes and mild interstitial prominence. Stable enlargement of the cardiac and mediastinal contours. The patient is somewhat rotated toward the right. No pneumothorax or pleural effusion. IMPRESSION: 1. Mild patchy right lower and middle lobe airspace opacities concerning for pneumonia. 2. Otherwise, stable chronic background pulmonary parenchymal changes and cardiomegaly. Electronically Signed   By: Jacqulynn Cadet M.D.   On: 06/26/2018 09:55   Ct Chest W Contrast  Result Date: 06/26/2018 CLINICAL DATA:  Productive cough, shortness of breath. EXAM: CT CHEST WITH CONTRAST TECHNIQUE: Multidetector CT imaging of the chest was performed during intravenous contrast administration. CONTRAST:  36mL OMNIPAQUE IOHEXOL 300 MG/ML  SOLN COMPARISON:  12/04/2005. FINDINGS: Cardiovascular: Atherosclerotic calcification of the aorta  and coronary arteries. Pulmonary arteries and heart are enlarged. No pericardial effusion. Mediastinum/Nodes: No pathologically enlarged mediastinal lymph nodes. 10 mm right hilar lymph node. No axillary adenopathy. Esophagus is grossly unremarkable. Lungs/Pleura: Image quality is degraded by respiratory motion and expiratory phase imaging. Centrilobular emphysema. Scarring in the posterior segment right upper lobe. Additional scarring in the right lower lobe. Left lung is clear. No pleural fluid. Airway is  unremarkable. Upper Abdomen: Visualized portion of the liver is unremarkable. Numerous calcified stones in the gallbladder. Visualized portions of the adrenal glands, kidneys, spleen, pancreas, stomach and bowel are grossly unremarkable. No upper abdominal adenopathy. Musculoskeletal: Degenerative changes in the spine. Question posttraumatic changes in the left ribs. IMPRESSION: 1. Image quality is degraded by respiratory motion and expiratory phase imaging as well as patient positioning. Favor scarring in the right upper and right lower lobes. No definite acute findings. 2. Cholelithiasis. 3. Aortic atherosclerosis (ICD10-170.0). Coronary artery calcification. 4. Enlarged pulmonary arteries, indicative of pulmonary arterial hypertension. 5.  Emphysema (ICD10-J43.9). Electronically Signed   By: Lorin Picket M.D.   On: 06/26/2018 15:45   Dg Chest Port 1 View  Result Date: 06/25/2018 CLINICAL DATA:  Cough and congestion. EXAM: PORTABLE CHEST 1 VIEW COMPARISON:  06/18/2018. FINDINGS: Patient rotated right mediastinum is normal. Right lower lobe atelectasis/consolidation. Close follow-up exams to demonstrate clearing suggested in order to exclude underlying mass lesion. Stable chronic interstitial prominence. Small right pleural effusion. No pneumothorax. Stable deformity left anterior fourth rib. Degenerative changes both shoulders. IMPRESSION: 1. Right lower lobe atelectasis/consolidation. Close follow-up exams to demonstrate clearing suggested in order to exclude underlying mass lesion. 2.  Stable chronic interstitial prominence. Electronically Signed   By: Marcello Moores  Register   On: 06/25/2018 10:34   Dg Chest Port 1 View  Result Date: 06/18/2018 CLINICAL DATA:  Short of breath.  History of COPD. EXAM: PORTABLE CHEST 1 VIEW COMPARISON:  08/09/2017 FINDINGS: Moderate enlargement of the cardiopericardial silhouette with a right heart predominance. No mediastinal or hilar masses. Lungs are hyperexpanded. There is  linear scarring or subsegmental atelectasis at the bases. There chronically prominent bronchovascular markings. No evidence of pneumonia or pulmonary edema. No convincing pleural effusion and no pneumothorax. Skeletal structures are grossly intact. IMPRESSION: No acute cardiopulmonary disease. Electronically Signed   By: Lajean Manes M.D.   On: 06/18/2018 13:51   (Echo, Carotid, EGD, Colonoscopy, ERCP)    Subjective: Resting in bed sleeping on her right side in no acute distress denies any complaints of chest pain or shortness of breath at this time   Discharge Exam: Vitals:   06/29/18 0548 06/29/18 0724  BP: (!) 121/96   Pulse: 72   Resp: 18   Temp: 98.1 F (36.7 C)   SpO2: 93% 90%   Vitals:   06/28/18 2054 06/28/18 2108 06/29/18 0548 06/29/18 0724  BP:  121/63 (!) 121/96   Pulse:  64 72   Resp:  18 18   Temp:  98.6 F (37 C) 98.1 F (36.7 C)   TempSrc:  Oral Oral   SpO2: 94% 92% 93% 90%  Weight:      Height:        General: Pt is alert, awake, not in acute distress Cardiovascular: RRR, S1/S2 +, no rubs, no gallops Respiratory: CTA bilaterally, no wheezing, no rhonchi Abdominal: Soft, NT, ND, bowel sounds + Extremities: no edema, no cyanosis    The results of significant diagnostics from this hospitalization (including imaging, microbiology, ancillary and laboratory) are listed below for reference.  Microbiology: No results found for this or any previous visit (from the past 240 hour(s)).   Labs: BNP (last 3 results) Recent Labs    08/08/17 2350 06/25/18 1146 06/27/18 0946  BNP 121.3* 153.3* 44.0   Basic Metabolic Panel: Recent Labs  Lab 06/25/18 0431 06/27/18 0946 06/28/18 0536 06/29/18 0411  NA  --  143 144 144  K  --  4.0 3.9 4.3  CL  --  89* 90* 93*  CO2  --  46* 46* 40*  GLUCOSE  --  212* 217* 252*  BUN  --  31* 34* 35*  CREATININE 0.87 0.78 0.72 0.80  CALCIUM  --  8.1* 8.2* 8.5*  MG  --  2.2  --   --   PHOS  --  4.5 3.3 3.6   Liver  Function Tests: Recent Labs  Lab 06/27/18 0946 06/28/18 0536 06/29/18 0411  ALBUMIN 3.1* 3.0* 2.9*   No results for input(s): LIPASE, AMYLASE in the last 168 hours. No results for input(s): AMMONIA in the last 168 hours. CBC: Recent Labs  Lab 06/25/18 0431 06/27/18 0946  WBC 6.3 8.7  NEUTROABS  --  7.6  HGB 14.0 13.8  HCT 46.7* 46.2*  MCV 103.5* 105.2*  PLT 195 185   Cardiac Enzymes: Recent Labs  Lab 06/26/18 1605 06/26/18 1935  TROPONINI <0.03 <0.03   BNP: Invalid input(s): POCBNP CBG: Recent Labs  Lab 06/27/18 2149  GLUCAP 278*   D-Dimer No results for input(s): DDIMER in the last 72 hours. Hgb A1c No results for input(s): HGBA1C in the last 72 hours. Lipid Profile No results for input(s): CHOL, HDL, LDLCALC, TRIG, CHOLHDL, LDLDIRECT in the last 72 hours. Thyroid function studies No results for input(s): TSH, T4TOTAL, T3FREE, THYROIDAB in the last 72 hours.  Invalid input(s): FREET3 Anemia work up No results for input(s): VITAMINB12, FOLATE, FERRITIN, TIBC, IRON, RETICCTPCT in the last 72 hours. Urinalysis    Component Value Date/Time   COLORURINE YELLOW 11/14/2014 2021   APPEARANCEUR CLEAR 11/14/2014 2021   LABSPEC 1.004 (L) 11/14/2014 2021   PHURINE 5.5 11/14/2014 2021   GLUCOSEU NEGATIVE 11/14/2014 2021   HGBUR NEGATIVE 11/14/2014 2021   BILIRUBINUR NEGATIVE 11/14/2014 2021   KETONESUR NEGATIVE 11/14/2014 2021   PROTEINUR NEGATIVE 11/14/2014 2021   UROBILINOGEN 1.0 11/14/2014 2021   NITRITE NEGATIVE 11/14/2014 2021   LEUKOCYTESUR NEGATIVE 11/14/2014 2021   Sepsis Labs Invalid input(s): PROCALCITONIN,  WBC,  LACTICIDVEN Microbiology No results found for this or any previous visit (from the past 240 hour(s)).   Time coordinating discharge: 36 minutes  SIGNED:   Georgette Shell, MD  [facility] 06/29/2018, 12:33 PM Pager   If 7PM-7AM, please contact night-coverage [email or web address] Password [email or web address]

## 2018-06-29 NOTE — Progress Notes (Signed)
Physical Therapy Treatment Patient Details Name: [name] MRN: [id number] DOB: 07/24/1931 Today's Date: 06/29/2018    History of Present Illness Patient admitted with COPD exacerbation.  [name] is an 85 year old African-American female with past medical history significant for chronic COPD O2 dependent on 3 L/min nasal cannula at home, diastolic congestive heart failure hypertension hyperlipidemia GERD.  She was admitted due to dyspnea despite her nebulizer and oxygen at home.    PT Comments    Pt is progressing slowly with mobility. LOB with near fall x 1 while ambulating on today. O2 sat 90% on 3L [place] at rest, 82% on 4L [place] during activity. Pt is weak and fatigues easily. No family present during session. At this time, recommendation is for SNF rehab if pt will agree. Will continue to follow.     Follow Up Recommendations  SNF;Supervision/Assistance - 24 hour(unless pt will have sufficient assistance at home)     Equipment Recommendations  None recommended by PT    Recommendations for Other Services       Precautions / Restrictions Precautions Precautions: Fall Precaution Comments: oxygen dependent Restrictions Weight Bearing Restrictions: No    Mobility  Bed Mobility Overal bed mobility: Needs Assistance Bed Mobility: Supine to Sit     Supine to sit: Min assist     General bed mobility comments: assist for trunk. increased time.   Transfers Overall transfer level: Needs assistance Equipment used: Rolling walker (2 wheeled) Transfers: Sit to/from Stand Sit to Stand: Min assist         General transfer comment: Assist to rise, stabilize, control descent. VCs safety, technique, hand placement. Pt tends to place forearm(s) on walker for leverage to rise. Very unsteady.   Ambulation/Gait Ambulation/Gait assistance: Min assist Gait Distance (Feet): 12 Feet(x2) Assistive device: 4-wheeled walker Gait Pattern/deviations: Step-through pattern;Trunk  flexed;Decreased stride length     General Gait Details: Very unsteady gait. LOB with near fall as she was turning while approaching recliner. Assist to stabilize pt and safely manage rollator. O2 sat 82% on 4L [place]. Dyspnea 3/4. Pt fatigues very easily.    Stairs             Wheelchair Mobility    Modified Rankin (Stroke Patients Only)       Balance Overall balance assessment: Needs assistance         Standing balance support: Bilateral upper extremity supported Standing balance-Leahy Scale: Poor                              Cognition Arousal/Alertness: Awake/alert Behavior During Therapy: WFL for tasks assessed/performed Overall Cognitive Status: Within Functional Limits for tasks assessed                                        Exercises      General Comments        Pertinent Vitals/Pain Pain Assessment: No/denies pain    Home Living                      Prior Function            PT Goals (current goals can now be found in the care plan section) Progress towards PT goals: Progressing toward goals    Frequency    Min 3X/week      PT Plan Current  plan remains appropriate    Co-evaluation              AM-PAC PT "6 Clicks" Mobility   Outcome Measure  Help needed turning from your back to your side while in a flat bed without using bedrails?: A Little Help needed moving from lying on your back to sitting on the side of a flat bed without using bedrails?: A Little Help needed moving to and from a bed to a chair (including a wheelchair)?: A Little Help needed standing up from a chair using your arms (e.g., wheelchair or bedside chair)?: A Little Help needed to walk in hospital room?: A Little Help needed climbing 3-5 steps with a railing? : A Lot 6 Click Score: 17    End of Session Equipment Utilized During Treatment: Gait belt;Oxygen Activity Tolerance: Patient limited by fatigue Patient left: in  chair;with call bell/phone within reach;with chair alarm set   PT Visit Diagnosis: Unsteadiness on feet (R26.81);Other abnormalities of gait and mobility (R26.89);Other symptoms and signs involving the nervous system (R29.898);Muscle weakness (generalized) (M62.81)     Time: 7847-8412 PT Time Calculation (min) (ACUTE ONLY): 32 min  Charges:  $Gait Training: 23-37 mins                        Weston Anna, PT Acute Rehabilitation Services Pager: [phone] Office: [phone]

## 2018-06-30 MED ORDER — IPRATROPIUM-ALBUTEROL 0.5-2.5 (3) MG/3ML IN SOLN
3.0000 mL | Freq: Three times a day (TID) | RESPIRATORY_TRACT | Status: DC
  Administered 2018-06-30 – 2018-07-02 (×7): 3 mL via RESPIRATORY_TRACT
  Filled 2018-06-30 (×8): qty 3

## 2018-06-30 NOTE — Plan of Care (Signed)
  Problem: Education: Goal: Knowledge of disease or condition will improve Outcome: Progressing Goal: Knowledge of the prescribed therapeutic regimen will improve Outcome: Progressing   Problem: Respiratory: Goal: Ability to maintain a clear airway will improve Outcome: Progressing Goal: Levels of oxygenation will improve Outcome: Progressing   Problem: Clinical Measurements: Goal: Cardiovascular complication will be avoided Outcome: Progressing   Problem: Coping: Goal: Level of anxiety will decrease Outcome: Progressing   Problem: Elimination: Goal: Will not experience complications related to bowel motility Outcome: Progressing   Problem: Pain Managment: Goal: General experience of comfort will improve Outcome: Progressing   Problem: Safety: Goal: Ability to remain free from injury will improve Outcome: Progressing

## 2018-06-30 NOTE — Progress Notes (Signed)
Occupational Therapy Treatment Patient Details Name: [name] MRN: [id number] DOB: 02/02/1932 Today's Date: 06/30/2018    History of present illness Patient admitted with COPD exacerbation.  Wynne Rozak is an 85 year old African-American female with past medical history significant for chronic COPD O2 dependent on 3 L/min nasal cannula at home, diastolic congestive heart failure hypertension hyperlipidemia GERD.  She was admitted due to dyspnea despite her nebulizer and oxygen at home.   OT comments  Pt got up to chair and sats remained above 90 with extra time.  ADL had already been completed   Follow Up Recommendations  SNF    Equipment Recommendations  None recommended by OT    Recommendations for Other Services      Precautions / Restrictions Precautions Precautions: Fall Precaution Comments: oxygen dependent       Mobility Bed Mobility         Supine to sit: Min assist     General bed mobility comments: for trunk; HOB raised  Transfers   Equipment used: Rolling walker (2 wheeled)   Sit to Stand: Min guard Stand pivot transfers: Min guard       General transfer comment: close guard for safety. Cues for UE placement    Balance                                           ADL either performed or assessed with clinical judgement   ADL                           Toilet Transfer: Min guard;Stand-pivot;BSC             General ADL Comments: SPT to chair.  Adl had been completed. Pt is good about initiating rest breaks     Vision       Perception     Praxis      Cognition Arousal/Alertness: Awake/alert Behavior During Therapy: WFL for tasks assessed/performed Overall Cognitive Status: Within Functional Limits for tasks assessed                                          Exercises     Shoulder Instructions       General Comments 02 sats 91-93% on 3 liters; dyspnea 2/4    Pertinent  Vitals/ Pain       Pain Assessment: No/denies pain  Home Living                                          Prior Functioning/Environment              Frequency           Progress Toward Goals  OT Goals(current goals can now be found in the care plan section)  Progress towards OT goals: Progressing toward goals     Plan Discharge plan needs to be updated    Co-evaluation                 AM-PAC OT "6 Clicks" Daily Activity     Outcome Measure   Help from another person eating meals?: None Help from another person taking  care of personal grooming?: A Little Help from another person toileting, which includes using toliet, bedpan, or urinal?: A Lot Help from another person bathing (including washing, rinsing, drying)?: A Lot Help from another person to put on and taking off regular upper body clothing?: A Lot Help from another person to put on and taking off regular lower body clothing?: Total 6 Click Score: 14    End of Session    OT Visit Diagnosis: Muscle weakness (generalized) (M62.81)   Activity Tolerance     Patient Left in chair;with call bell/phone within reach;with chair alarm set   Nurse Communication          Time: 3567-0141 OT Time Calculation (min): 20 min  Charges: OT General Charges $OT Visit: 1 Visit OT Treatments $Therapeutic Activity: 8-22 mins  Lesle Chris, OTR/L Acute Rehabilitation Services [phone] [facility] pager [phone] office 06/30/2018   [place] 06/30/2018, 2:45 PM

## 2018-06-30 NOTE — Clinical Social Work Note (Signed)
Clinical Social Work Assessment  Patient Details  Name: [name] MRN: [id number] Date of Birth: [birth year]  Date of referral:  06/30/18               Reason for consult:  Facility Placement, Discharge Planning                Permission sought to share information with:  Family Supports Permission granted to share information::  Yes, Verbal Permission Granted  Name::     [name],   Agency::     Relationship::  daughter  Contact Information:  [phone]  Housing/Transportation Living arrangements for the past 2 months:  [place] of Information:  Patient Patient Interpreter Needed:  None Criminal Activity/Legal Involvement Pertinent to Current Situation/Hospitalization:  No - Comment as needed Significant Relationships:  Adult Children Lives with:  Adult Children, Self Do you feel safe going back to the place where you live?  Yes Need for family participation in patient care:  Yes (Comment)  Care giving concerns:  No family at bedside. patient stated she lives with her daughter   Facilities manager / plan:  Patient stated she is agreeable with going to a rehab facility for short term rehab. Patient stated her daughter wants her to go to [facility] because facility is close to home. CSW reached out to Santiago Glad and requested she start authorization through insurance. CSW awaiting insurance authorization through [facility] Medicare  Employment status:  Retired Nurse, adult PT Recommendations:  [place] / Referral to community resources:  [place]  Patient/Family's Response to care:  Patient pleasant and appreciative of CSW role in care  Patient/Family's Understanding of and Emotional Response to Diagnosis, Current Treatment, and Prognosis:  Patient agreeable with discharge plan for SNF   Emotional Assessment Appearance:  Appears stated age Attitude/Demeanor/Rapport:   Engaged Affect (typically observed):  Accepting Orientation:  Oriented to Self, Oriented to Place, Oriented to  Time Alcohol / Substance use:  Not Applicable Psych involvement (Current and /or in the community):  No (Comment)  Discharge Needs  Concerns to be addressed:  Care Coordination Readmission within the last 30 days:  No Current discharge risk:  Dependent with Mobility Barriers to Discharge:  Adrian, LCSW 06/30/2018, 9:21 AM

## 2018-07-01 LAB — GLUCOSE, CAPILLARY
Glucose-Capillary: 294 mg/dL — ABNORMAL HIGH (ref 70–99)
Glucose-Capillary: 187 mg/dL — ABNORMAL HIGH (ref 70–99)
Glucose-Capillary: 292 mg/dL — ABNORMAL HIGH (ref 70–99)

## 2018-07-01 MED ORDER — PREDNISONE 20 MG PO TABS
40.0000 mg | ORAL_TABLET | Freq: Every day | ORAL | Status: DC
  Administered 2018-07-02: 40 mg via ORAL
  Filled 2018-07-01: qty 2

## 2018-07-01 MED ORDER — INSULIN ASPART 100 UNIT/ML ~~LOC~~ SOLN
0.0000 [IU] | Freq: Every day | SUBCUTANEOUS | Status: DC
  Administered 2018-07-01: 3 [IU] via SUBCUTANEOUS

## 2018-07-01 MED ORDER — INSULIN ASPART 100 UNIT/ML ~~LOC~~ SOLN
0.0000 [IU] | Freq: Three times a day (TID) | SUBCUTANEOUS | Status: DC
  Administered 2018-07-01: 5 [IU] via SUBCUTANEOUS
  Administered 2018-07-01 – 2018-07-02 (×4): 2 [IU] via SUBCUTANEOUS

## 2018-07-01 MED ORDER — DILTIAZEM HCL 30 MG PO TABS
30.0000 mg | ORAL_TABLET | Freq: Two times a day (BID) | ORAL | Status: DC
  Administered 2018-07-02: 30 mg via ORAL
  Filled 2018-07-01: qty 1

## 2018-07-01 MED ORDER — MAGNESIUM SULFATE IN D5W 1-5 GM/100ML-% IV SOLN
1.0000 g | Freq: Once | INTRAVENOUS | Status: AC
  Administered 2018-07-01: 1 g via INTRAVENOUS
  Filled 2018-07-01: qty 100

## 2018-07-01 NOTE — Progress Notes (Signed)
PT Cancellation Note  Patient Details Name: [name] MRN: [id number] DOB: [birth year]   Cancelled Treatment:    Reason Eval/Treat Not Completed: Attempted PT tx-pt declined to participate 2* "not feeling too good." Jacobs check back another day.    Weston Anna, PT Acute Rehabilitation Services Pager: [phone] Office: [phone]
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
## 2018-07-01 NOTE — Progress Notes (Signed)
Pt had a 19 beat run of VT. Pt asleep at the time, VSS. Pt immediately returned to SB in upper 44s. K level 4.3, Mag not checked since 01/18. MD Bonner Puna paged to be made aware.

## 2018-07-02 DIAGNOSIS — I471 Supraventricular tachycardia: Secondary | ICD-10-CM

## 2018-07-02 LAB — GLUCOSE, CAPILLARY
Glucose-Capillary: 157 mg/dL — ABNORMAL HIGH (ref 70–99)
Glucose-Capillary: 170 mg/dL — ABNORMAL HIGH (ref 70–99)
Glucose-Capillary: 167 mg/dL — ABNORMAL HIGH (ref 70–99)

## 2018-07-02 LAB — BASIC METABOLIC PANEL
Anion gap: 5 (ref 5–15)
BUN: 36 mg/dL — ABNORMAL HIGH (ref 8–23)
CO2: 45 mmol/L — ABNORMAL HIGH (ref 22–32)
CREATININE: 0.71 mg/dL (ref 0.44–1.00)
Calcium: 8.6 mg/dL — ABNORMAL LOW (ref 8.9–10.3)
Chloride: 94 mmol/L — ABNORMAL LOW (ref 98–111)
GFR calc Af Amer: >60 mL/min (ref >60)
GFR calc non Af Amer: >60 mL/min (ref >60)
Glucose, Bld: 199 mg/dL — ABNORMAL HIGH (ref 70–99)
Potassium: 4.8 mmol/L (ref 3.5–5.1)
Sodium: 144 mmol/L (ref 135–145)

## 2018-07-02 LAB — MAGNESIUM: Magnesium: 2.5 mg/dL — ABNORMAL HIGH (ref 1.7–2.4)

## 2018-07-02 MED ORDER — PREDNISONE 10 MG PO TABS: ORAL_TABLET | ORAL | 0 refills | Status: AC

## 2018-07-02 MED ORDER — DILTIAZEM HCL 30 MG PO TABS: 30.0000 mg | ORAL_TABLET | Freq: Two times a day (BID) | ORAL | Status: DC
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
## 2018-07-02 NOTE — Progress Notes (Signed)
Physical Therapy Treatment Patient Details Name: [name] MRN: [id number] DOB: 09-12-1931 Today's Date: 07/02/2018    History of Present Illness Patient admitted with COPD exacerbation.  [name] is an 85 year old African-American female with past medical history significant for chronic COPD O2 dependent on 3 L/min nasal cannula at home, diastolic congestive heart failure hypertension hyperlipidemia GERD.  She was admitted due to dyspnea despite her nebulizer and oxygen at home.    PT Comments    Pt sleeping on arrival and reports lack of sleep last night, feeling tired today.  Pt agreeable to OOB to recliner however declined further mobility.   Continue to recommend SNF upon d/c to improve strength, endurance, mobility prior to return back home.   Follow Up Recommendations  SNF;Supervision/Assistance - 24 hour     Equipment Recommendations  None recommended by PT    Recommendations for Other Services       Precautions / Restrictions Precautions Precautions: Fall Precaution Comments: oxygen dependent    Mobility  Bed Mobility Overal bed mobility: Needs Assistance Bed Mobility: Supine to Sit     Supine to sit: Min guard;HOB elevated     General bed mobility comments: min/guard for safety  Transfers Overall transfer level: Needs assistance Equipment used: 1 person hand held assist Transfers: Sit to/from Stand Sit to Stand: Min assist         General transfer comment: pt declined using RW for transfer to recliner, reached for armrest and HHA provided for safety, assist for rise and steadying with transfer  Ambulation/Gait             General Gait Details: pt declined today due to feeling tired/sleepy   Stairs             Wheelchair Mobility    Modified Rankin (Stroke Patients Only)       Balance Overall balance assessment: Needs assistance         Standing balance support: Bilateral upper extremity supported Standing  balance-Leahy Scale: Poor Standing balance comment: UE support and assist for balance                            Cognition Arousal/Alertness: Awake/alert Behavior During Therapy: WFL for tasks assessed/performed Overall Cognitive Status: Within Functional Limits for tasks assessed                                 General Comments: awake/alert for mobilizing however falls back asleep when not engaged      Exercises      General Comments General comments (skin integrity, edema, etc.): Pt remained on 3L O2 [name], dyspnea 2/4      Pertinent Vitals/Pain Pain Assessment: No/denies pain    Home Living                      Prior Function            PT Goals (current goals can now be found in the care plan section) Progress towards PT goals: Progressing toward goals    Frequency    Min 3X/week      PT Plan Current plan remains appropriate    Co-evaluation              AM-PAC PT "6 Clicks" Mobility   Outcome Measure  Help needed turning from your back to your side while in a  flat bed without using bedrails?: A Little Help needed moving from lying on your back to sitting on the side of a flat bed without using bedrails?: A Little Help needed moving to and from a bed to a chair (including a wheelchair)?: A Little Help needed standing up from a chair using your arms (e.g., wheelchair or bedside chair)?: A Little Help needed to walk in hospital room?: A Little Help needed climbing 3-5 steps with a railing? : A Lot 6 Click Score: 17    End of Session Equipment Utilized During Treatment: Gait belt;Oxygen Activity Tolerance: Patient limited by fatigue Patient left: in chair;with call bell/phone within reach;with chair alarm set;with family/visitor present Nurse Communication: Mobility status PT Visit Diagnosis: Unsteadiness on feet (R26.81);Muscle weakness (generalized) (M62.81);Difficulty in walking, not elsewhere classified (R26.2)      Time: 2481-8590 PT Time Calculation (min) (ACUTE ONLY): 11 min  Charges:  $Gait Training: 8-22 mins                     [name], PT, DPT Acute Rehabilitation Services Office: [phone] Pager: [phone]  [name] 07/02/2018, 3:21 PM

## 2018-07-02 NOTE — Clinical Social Work Placement (Signed)
   CLINICAL SOCIAL WORK PLACEMENT  NOTE  Date:  07/02/2018  Patient Details  Name: [name] MRN: [id number] Date of Birth: 1931-09-22  Clinical Social Work is seeking post-discharge placement for this patient at the [place] level of care (*CSW will initial, date and re-position this form in  chart as items are completed):  Yes   Patient/family provided with [facility]'s list of facilities offering this level of care within the geographic area requested by the patient (or if unable, by the patient's family).  Yes   Patient/family informed of their freedom to choose among providers that offer the needed level of care, that participate in Medicare, Medicaid or managed care program needed by the patient, have an available bed and are willing to accept the patient.  Yes   Patient/family informed of [facility]'s ownership interest in [facility] and [facility], as well as of the fact that they are under no obligation to receive care at these facilities.  PASRR submitted to EDS on       PASRR number received on       Existing PASRR number confirmed on 07/02/18     FL2 transmitted to all facilities in geographic area requested by pt/family on 07/02/18     FL2 transmitted to all facilities within larger geographic area on       Patient informed that his/her managed care company has contracts with or will negotiate with certain facilities, including the following:            Patient/family informed of bed offers received.  Patient chooses bed at [facility]     Physician recommends and patient chooses bed at      Patient to be transferred to [facility] on 07/02/18.  Patient to be transferred to facility by [facility]     Patient family notified on 07/02/18 of transfer.  Name of family member notified:  spoke with pt daughter     PHYSICIAN       Additional Comment:     _______________________________________________ Wende Neighbors, LCSW 07/02/2018, 1:32 PM

## 2018-07-02 NOTE — Progress Notes (Signed)
Clinical Social Worker facilitated patient discharge including contacting patient family and facility to confirm patient discharge plans.  Clinical information faxed to facility and family agreeable with plan.  CSW arranged ambulance transport via [facility] to [facility] .  RN to call [phone] (rm [phone]) for report prior to discharge.  Clinical Social Worker will sign off for now as social work intervention is no longer needed. Please consult [name] again if new need arises.  Rhea Pink, MSW, [place]

## 2018-07-02 NOTE — Care Management Important Message (Signed)
Important Message  Patient Details  Name: [name] MRN: [id number] Date of Birth: [birth year]   Medicare Important Message Given:  Yes    Kerin Salen 07/02/2018, 11:55 AMImportant Message  Patient Details  Name: [name] MRN: [id number] Date of Birth: February 17, 1932   Medicare Important Message Given:  Yes    Kerin Salen 07/02/2018, 11:55 AM

## 2018-07-11 DEATH — deceased

## 2018-07-13 ENCOUNTER — Other Ambulatory Visit: Payer: Self-pay | Admitting: Internal Medicine

## 2018-07-20 ENCOUNTER — Other Ambulatory Visit: Payer: Self-pay | Admitting: Internal Medicine

## 2018-07-20 MED ORDER — DILTIAZEM HCL 30 MG PO TABS: 30.0000 mg | ORAL_TABLET | Freq: Two times a day (BID) | ORAL | Status: AC

## 2018-07-21 ENCOUNTER — Ambulatory Visit: Payer: Medicare Other | Admitting: Physician Assistant

## 2018-09-23 LAB — BLOOD GAS, ARTERIAL
Acid-Base Excess: 13.3 mmol/L — ABNORMAL HIGH (ref 0.0–2.0)
Bicarbonate: 46.1 mmol/L — ABNORMAL HIGH (ref 20.0–28.0)
Drawn by: 441261
FIO2: 100
O2 Saturation: 99.4 %
Patient temperature: 37
pCO2 arterial: 111 mmHg (ref 32.0–48.0)
pH, Arterial: 7.241 — ABNORMAL LOW (ref 7.350–7.450)
pO2, Arterial: 363 mmHg — ABNORMAL HIGH (ref 83.0–108.0)

## 2019-02-07 IMAGING — DX DG CHEST 2V
2 series · 2 of 2 positions shown · non-contrast
Comparison: Chest radiograph May 16, 2014

CLINICAL DATA: Increased cough, congestion for 7 days. History of
COPD.

EXAM:
CHEST  2 VIEW

[chest pa]
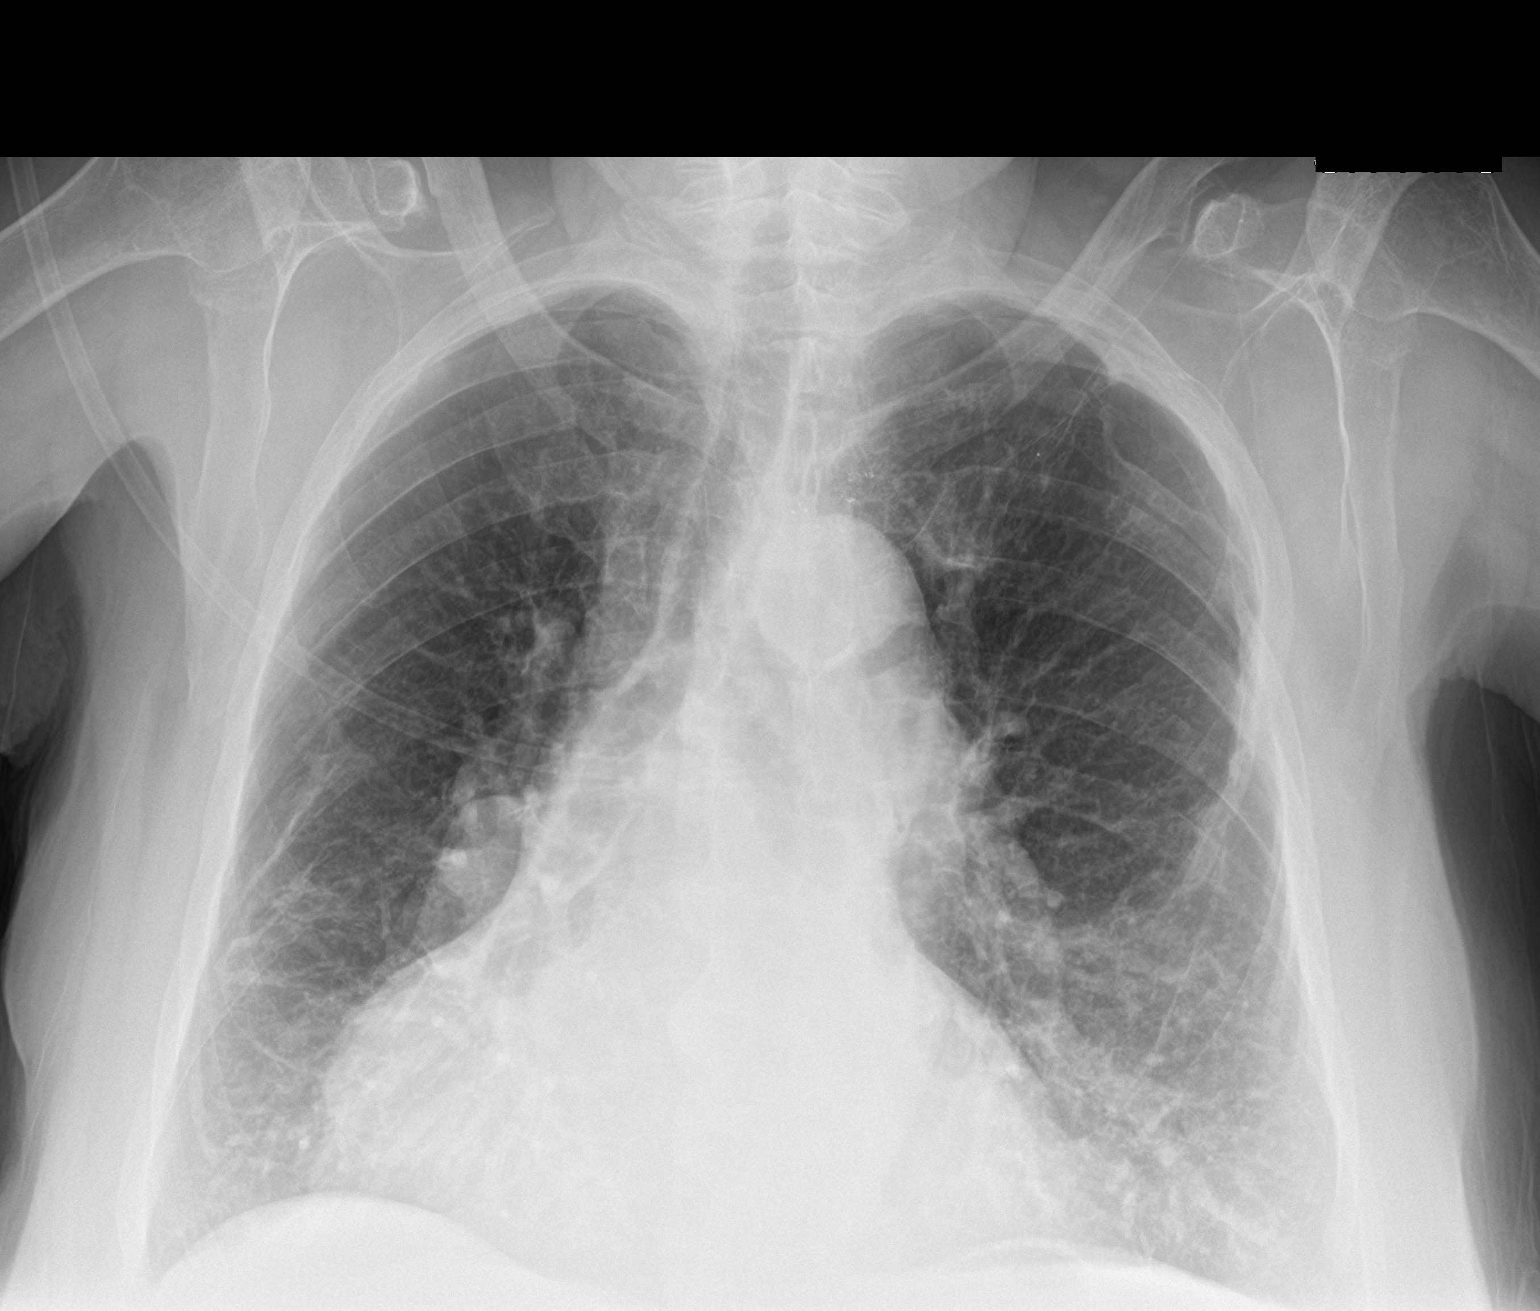

[chest lat]
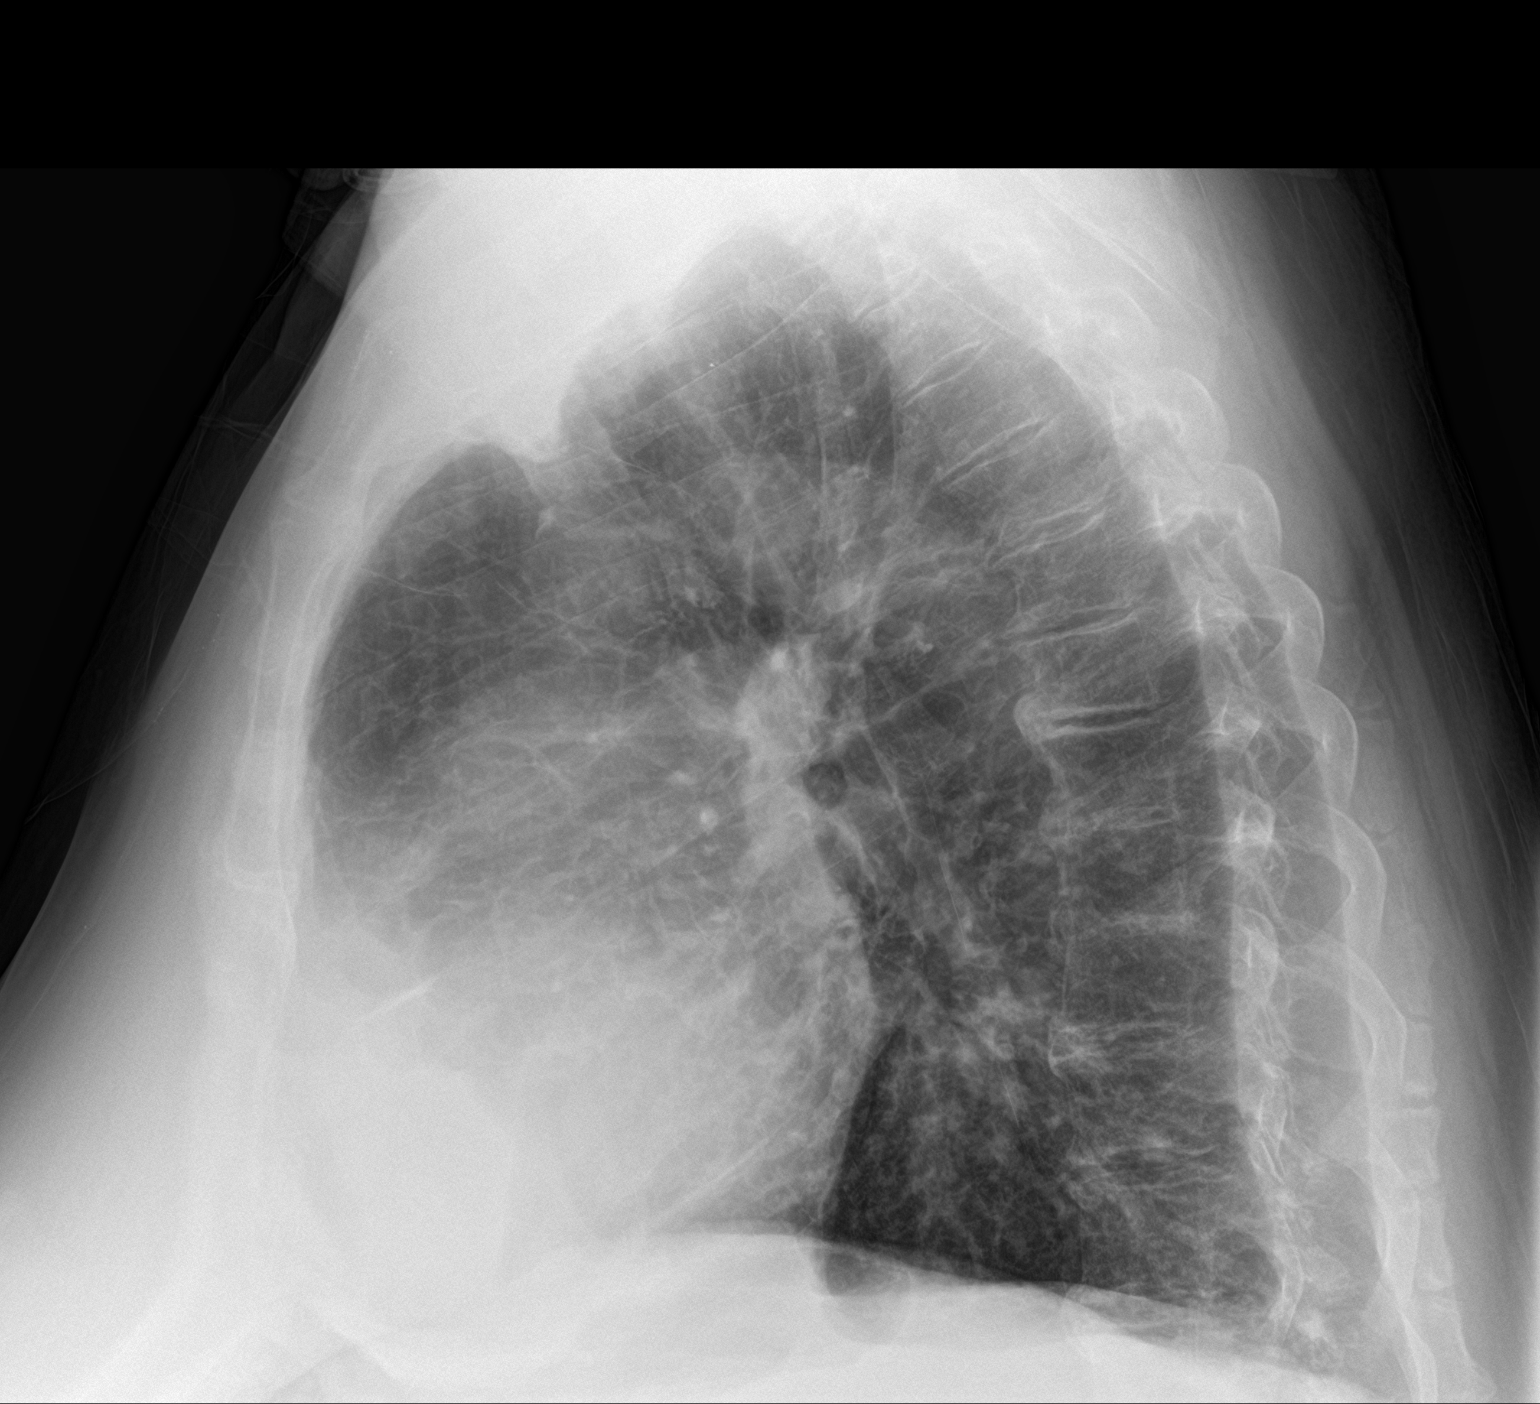

[2 of 2 positions shown; findings below may reference images not displayed]

FINDINGS: Cardiac silhouette is moderately enlarged. Mildly calcified aortic
knob. Similar chronic interstitial changes and increased lung
volumes with flattened hemidiaphragms. No pleural effusion or focal
consolidation. No pneumothorax. Old LEFT rib fractures versus
thoracotomy. Soft tissue planes are nonsuspicious.
IMPRESSION: Stable cardiomegaly and COPD.

Aortic Atherosclerosis (LOSSG-EUG.G).

## 2020-05-01 IMAGING — DX DG CHEST 1V PORT
1 series · 1 of 1 positions shown · non-contrast
Comparison: 08/09/2017

CLINICAL DATA: Short of breath.  History of COPD.

EXAM:
PORTABLE CHEST 1 VIEW

[chest ap]
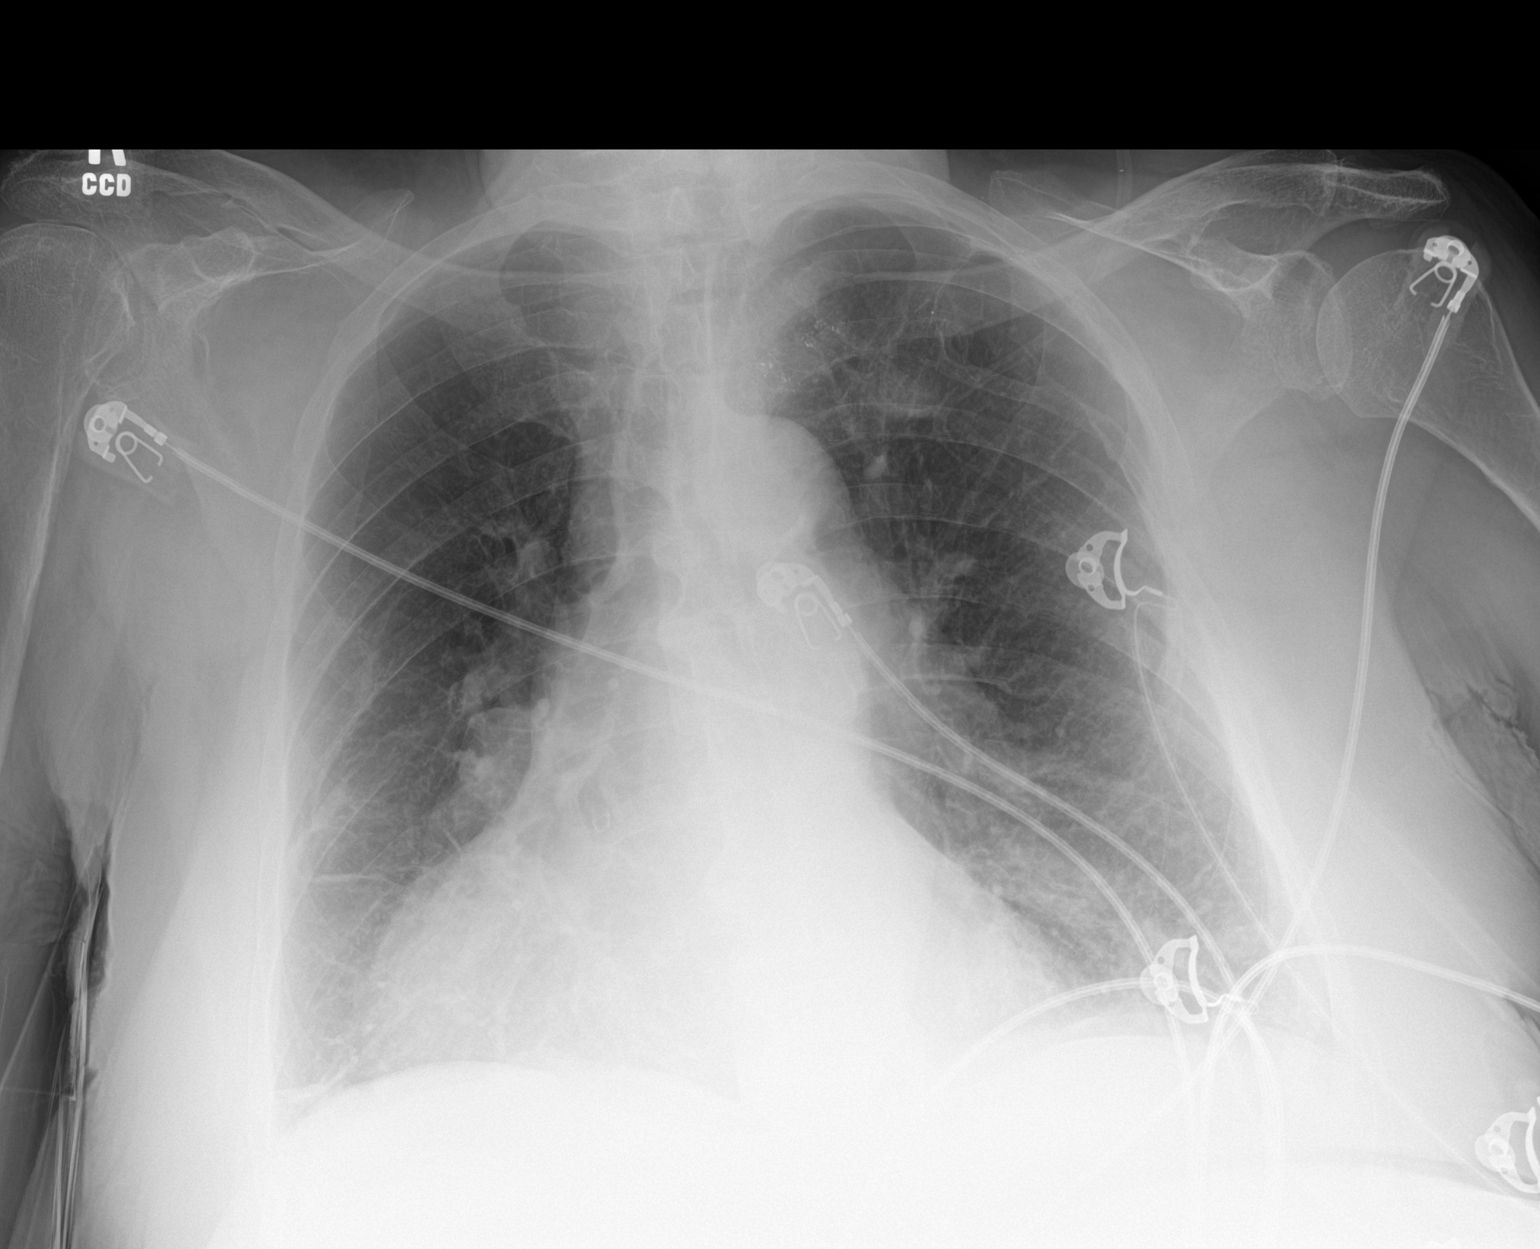

[1 of 1 positions shown; findings below may reference images not displayed]

FINDINGS: Moderate enlargement of the cardiopericardial silhouette with a
right heart predominance.

No mediastinal or hilar masses.

Lungs are hyperexpanded. There is linear scarring or subsegmental
atelectasis at the bases. There chronically prominent
bronchovascular markings. No evidence of pneumonia or pulmonary
edema.

No convincing pleural effusion and no pneumothorax.

Skeletal structures are grossly intact.
IMPRESSION: No acute cardiopulmonary disease.

## 2020-05-09 IMAGING — CT CT CHEST W/ CM
3 of 5 series · 16 of 36 positions shown, 18 images · IV contrast (omnipaque)
Comparison: 12/04/2005.

CLINICAL DATA: Productive cough, shortness of breath.

EXAM:
CT CHEST WITH CONTRAST
TECHNIQUE: Multidetector CT imaging of the chest was performed during
intravenous contrast administration.
CONTRAST:  75mL OMNIPAQUE IOHEXOL 300 MG/ML  SOLN

[Series 2: axial st · axial · 0.72mm/px · z∈[+1308,+1458]mm · 5 of 151 slices shown (1 of 2)]
[im 19/151  lung]
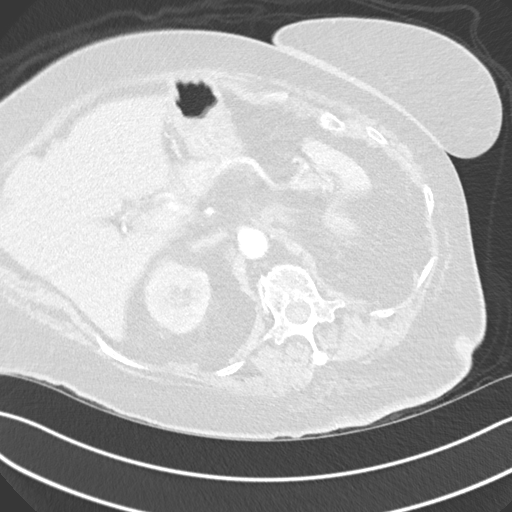
[im 38/151  lung]
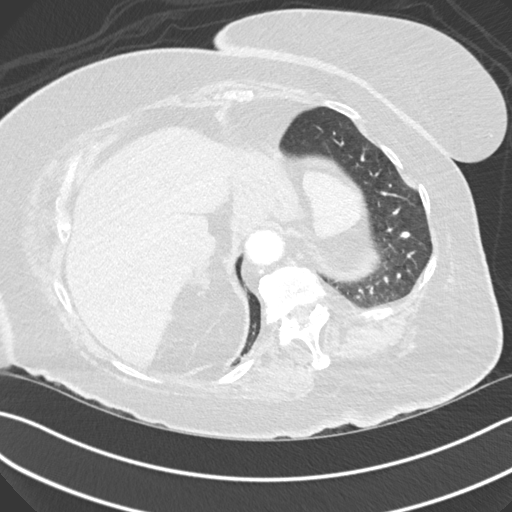
[im 57/151  lung]
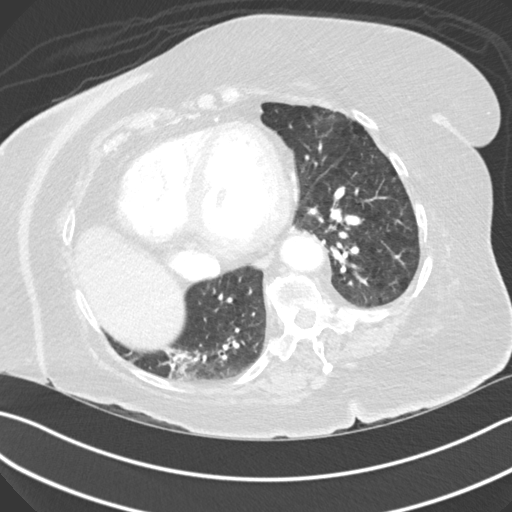
[im 76/151  lung]
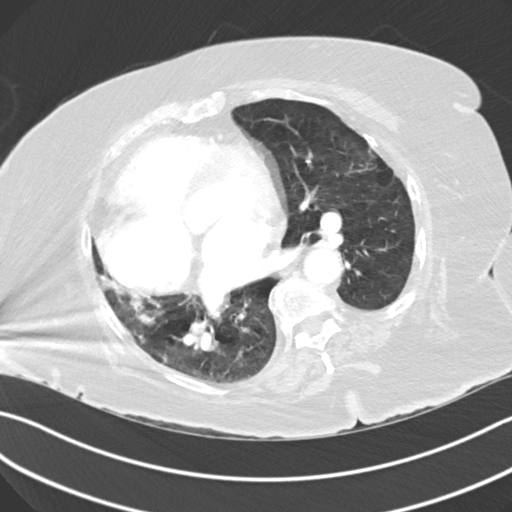
[im 94/151  lung]
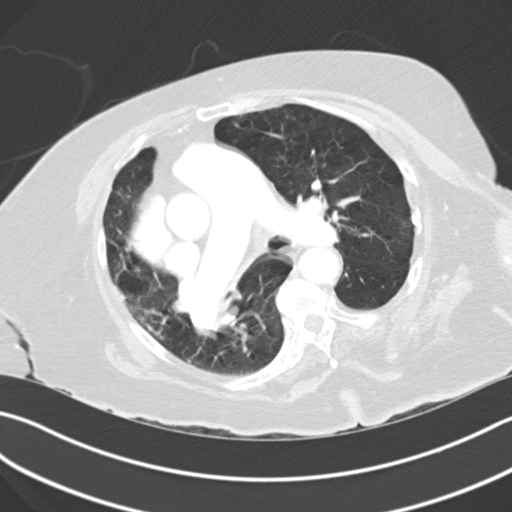

[Series 5: coronal · coronal · 0.62mm/px · 3 of 148 slices shown]
[im 30/148  lung]
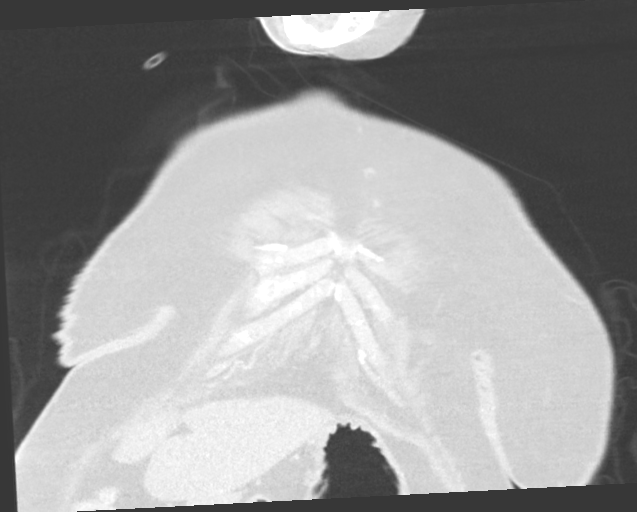
[im 59/148  lung]
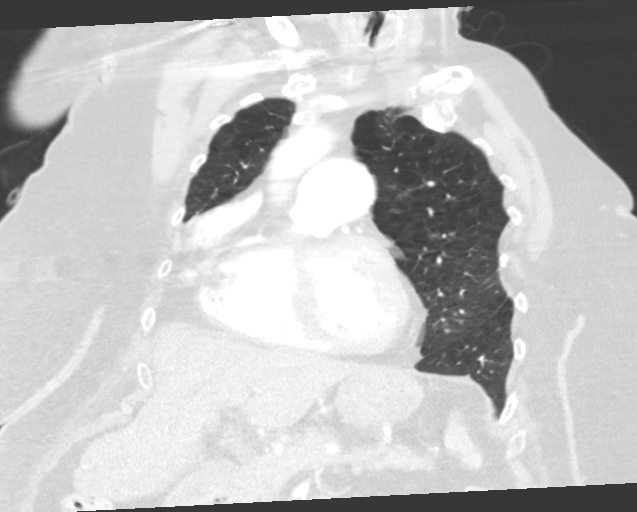
[im 89/148  lung]
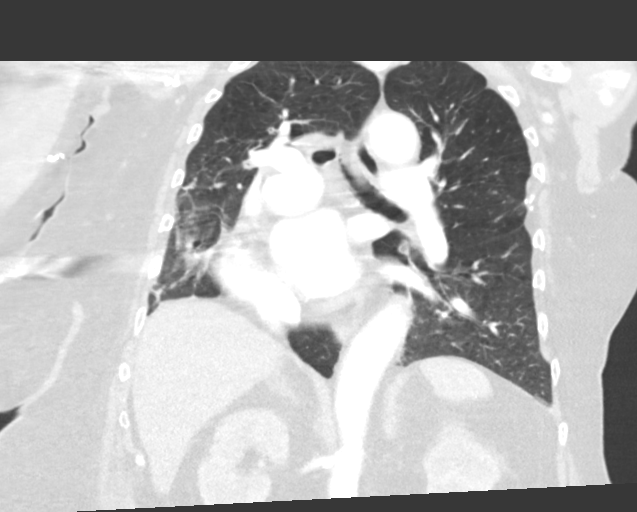

[Series 8: axial st · axial · 0.72mm/px · z∈[+1304,+1538]mm · 8 of 151 slices shown, 10 images (2 of 2)]
[im 17/151  mediastinal]
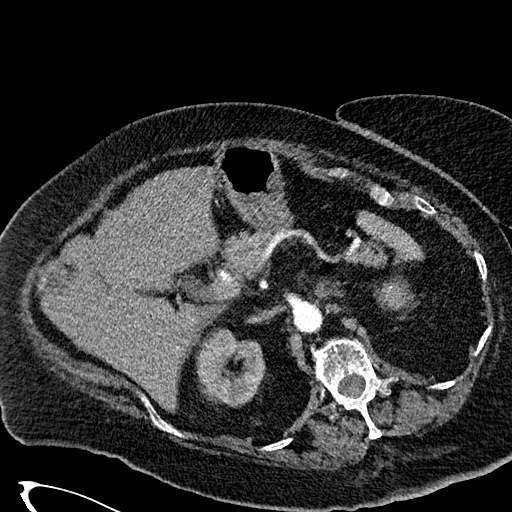
[im 17/151  lung]
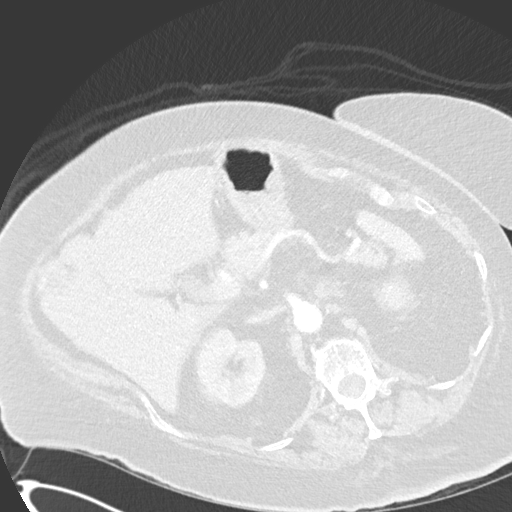
[im 34/151  lung]
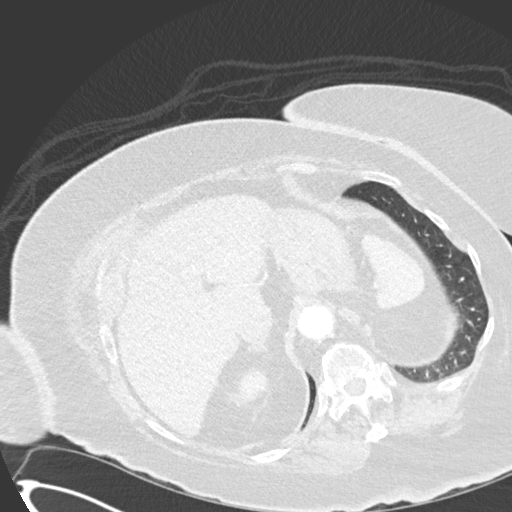
[im 51/151  lung]
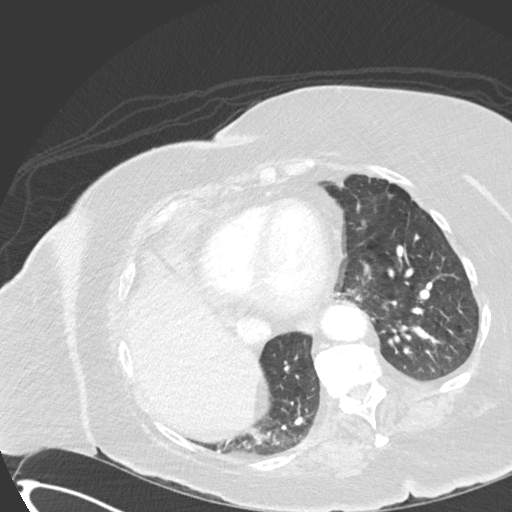
[im 67/151  lung]
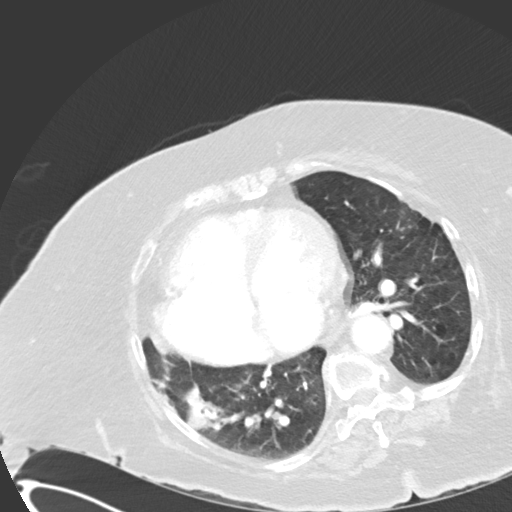
[im 84/151  mediastinal]
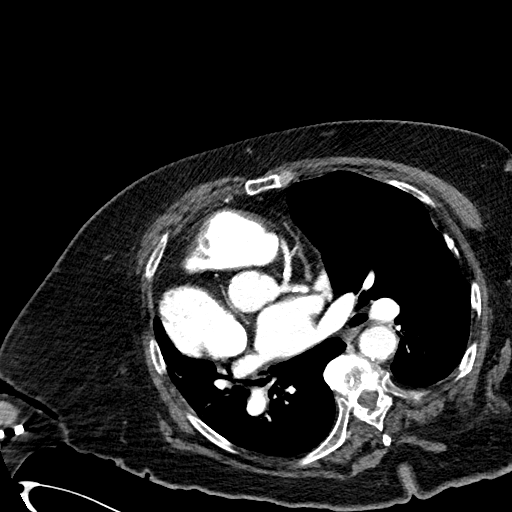
[im 84/151  lung]
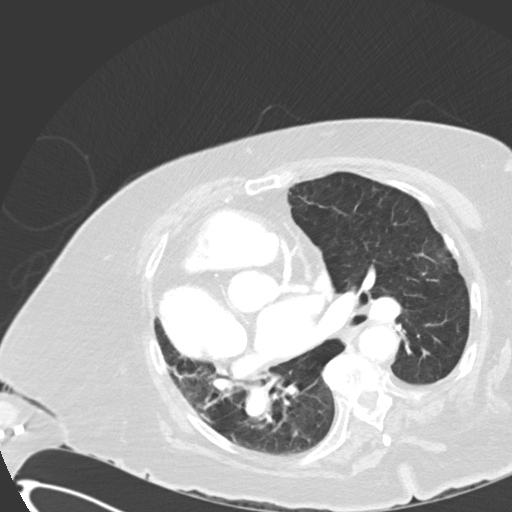
[im 101/151  lung]
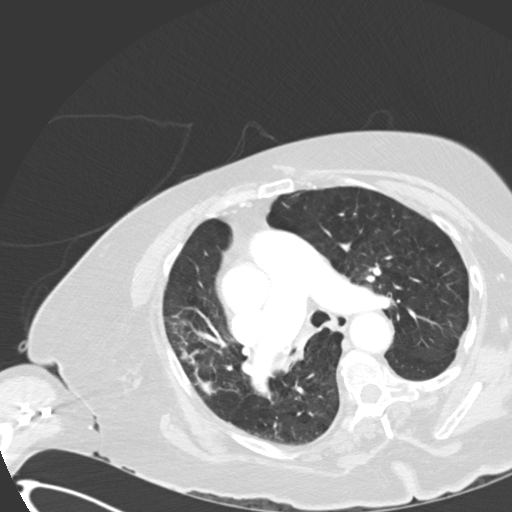
[im 117/151  lung]
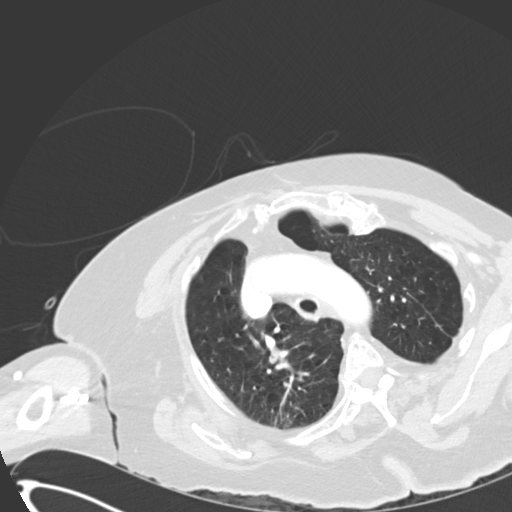
[im 134/151  lung]
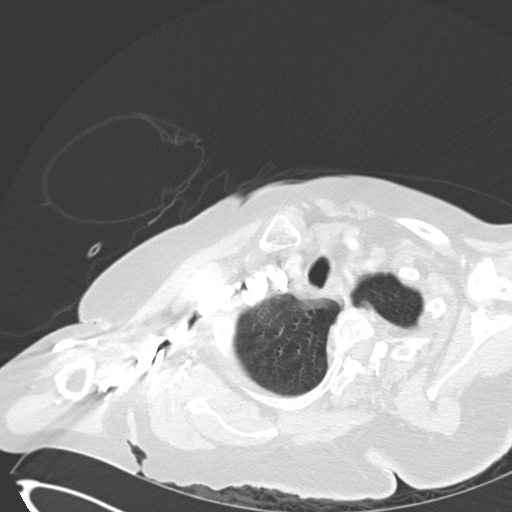

[16 of 36 positions shown; findings below may reference images not displayed]

FINDINGS: Cardiovascular: Atherosclerotic calcification of the aorta and
coronary arteries. Pulmonary arteries and heart are enlarged. No
pericardial effusion.

Mediastinum/Nodes: No pathologically enlarged mediastinal lymph
nodes. 10 mm right hilar lymph node. No axillary adenopathy.
Esophagus is grossly unremarkable.

Lungs/Pleura: Image quality is degraded by respiratory motion and
expiratory phase imaging. Centrilobular emphysema. Scarring in the
posterior segment right upper lobe. Additional scarring in the right
lower lobe. Left lung is clear. No pleural fluid. Airway is
unremarkable.

Upper Abdomen: Visualized portion of the liver is unremarkable.
Numerous calcified stones in the gallbladder. Visualized portions of
the adrenal glands, kidneys, spleen, pancreas, stomach and bowel are
grossly unremarkable. No upper abdominal adenopathy.

Musculoskeletal: Degenerative changes in the spine. Question
posttraumatic changes in the left ribs.
IMPRESSION: 1. Image quality is degraded by respiratory motion and expiratory
phase imaging as well as patient positioning. Favor scarring in the
right upper and right lower lobes. No definite acute findings.
2. Cholelithiasis.
3. Aortic atherosclerosis (BKHCI-170.0). Coronary artery
calcification.
4. Enlarged pulmonary arteries, indicative of pulmonary arterial
hypertension.
5.  Emphysema (BKHCI-SJA.K).
